# Patient Record
Sex: Female | Born: 1970 | Race: White | Hispanic: No | Marital: Married | Smoking: Current some day smoker
Health system: Southern US, Community
[De-identification: ages and names within clinical notes are randomized; demographics above are authoritative.]

## PROBLEM LIST (undated history)

## (undated) DIAGNOSIS — C801 Malignant (primary) neoplasm, unspecified: Secondary | ICD-10-CM

## (undated) DIAGNOSIS — L299 Pruritus, unspecified: Secondary | ICD-10-CM

## (undated) DIAGNOSIS — I10 Essential (primary) hypertension: Secondary | ICD-10-CM

## (undated) DIAGNOSIS — I2510 Atherosclerotic heart disease of native coronary artery without angina pectoris: Secondary | ICD-10-CM

## (undated) DIAGNOSIS — G2581 Restless legs syndrome: Secondary | ICD-10-CM

## (undated) DIAGNOSIS — E119 Type 2 diabetes mellitus without complications: Secondary | ICD-10-CM

## (undated) DIAGNOSIS — I219 Acute myocardial infarction, unspecified: Secondary | ICD-10-CM

## (undated) DIAGNOSIS — Z720 Tobacco use: Secondary | ICD-10-CM

## (undated) DIAGNOSIS — Z951 Presence of aortocoronary bypass graft: Secondary | ICD-10-CM

## (undated) DIAGNOSIS — I25119 Atherosclerotic heart disease of native coronary artery with unspecified angina pectoris: Secondary | ICD-10-CM

## (undated) DIAGNOSIS — F419 Anxiety disorder, unspecified: Secondary | ICD-10-CM

## (undated) HISTORY — DX: Type 2 diabetes mellitus without complications: E11.9

## (undated) HISTORY — DX: Malignant (primary) neoplasm, unspecified: C80.1

## (undated) HISTORY — DX: Anxiety disorder, unspecified: F41.9

## (undated) HISTORY — PX: CARDIAC CATHETERIZATION: SHX172

## (undated) HISTORY — DX: Restless legs syndrome: G25.81

## (undated) HISTORY — DX: Pruritus, unspecified: L29.9

## (undated) HISTORY — DX: Atherosclerotic heart disease of native coronary artery with unspecified angina pectoris: I25.119

## (undated) HISTORY — DX: Essential (primary) hypertension: I10

---

## 1992-07-09 DIAGNOSIS — C801 Malignant (primary) neoplasm, unspecified: Secondary | ICD-10-CM

## 1992-07-09 HISTORY — DX: Malignant (primary) neoplasm, unspecified: C80.1

## 2007-07-10 HISTORY — PX: PARTIAL HYSTERECTOMY: SHX80

## 2014-04-07 ENCOUNTER — Encounter: Payer: Self-pay | Admitting: *Deleted

## 2014-04-07 ENCOUNTER — Other Ambulatory Visit: Payer: Self-pay | Admitting: *Deleted

## 2014-04-07 ENCOUNTER — Telehealth: Payer: Self-pay | Admitting: *Deleted

## 2014-04-07 DIAGNOSIS — F419 Anxiety disorder, unspecified: Secondary | ICD-10-CM | POA: Insufficient documentation

## 2014-04-07 DIAGNOSIS — G2581 Restless legs syndrome: Secondary | ICD-10-CM | POA: Insufficient documentation

## 2014-04-07 DIAGNOSIS — E119 Type 2 diabetes mellitus without complications: Secondary | ICD-10-CM | POA: Insufficient documentation

## 2014-04-07 DIAGNOSIS — I10 Essential (primary) hypertension: Secondary | ICD-10-CM | POA: Insufficient documentation

## 2014-04-07 NOTE — Telephone Encounter (Signed)
Patient's husband is a Administrator and she rides with him. We are his primary care and she would like to have her records transferred here as well. Records release signed. EMR updated with medical history and medications. He will be coming back next month for a follow up and she will schedule her appt at the same time.  I suggested Dr Sabra Heck for both of them. Husband has been seeing Evelina Dun, FNP but she will be out on maternity leave.

## 2014-05-05 ENCOUNTER — Telehealth: Payer: Self-pay | Admitting: Family Medicine

## 2014-05-05 NOTE — Telephone Encounter (Signed)
Appt given per patient request 

## 2014-05-07 ENCOUNTER — Encounter: Payer: Self-pay | Admitting: Family Medicine

## 2014-05-07 ENCOUNTER — Ambulatory Visit (INDEPENDENT_AMBULATORY_CARE_PROVIDER_SITE_OTHER): Payer: Self-pay | Admitting: Family Medicine

## 2014-05-07 ENCOUNTER — Other Ambulatory Visit: Payer: Self-pay | Admitting: Family Medicine

## 2014-05-07 ENCOUNTER — Encounter (INDEPENDENT_AMBULATORY_CARE_PROVIDER_SITE_OTHER): Payer: Self-pay

## 2014-05-07 VITALS — BP 119/76 | HR 99 | Temp 98.9°F | Ht <= 58 in | Wt 157.4 lb

## 2014-05-07 DIAGNOSIS — G2581 Restless legs syndrome: Secondary | ICD-10-CM

## 2014-05-07 DIAGNOSIS — M1990 Unspecified osteoarthritis, unspecified site: Secondary | ICD-10-CM

## 2014-05-07 DIAGNOSIS — E1129 Type 2 diabetes mellitus with other diabetic kidney complication: Secondary | ICD-10-CM

## 2014-05-07 DIAGNOSIS — I10 Essential (primary) hypertension: Secondary | ICD-10-CM

## 2014-05-07 DIAGNOSIS — F329 Major depressive disorder, single episode, unspecified: Secondary | ICD-10-CM

## 2014-05-07 DIAGNOSIS — L299 Pruritus, unspecified: Secondary | ICD-10-CM

## 2014-05-07 DIAGNOSIS — F32A Depression, unspecified: Secondary | ICD-10-CM

## 2014-05-07 DIAGNOSIS — K219 Gastro-esophageal reflux disease without esophagitis: Secondary | ICD-10-CM

## 2014-05-07 DIAGNOSIS — G629 Polyneuropathy, unspecified: Secondary | ICD-10-CM

## 2014-05-07 MED ORDER — METFORMIN HCL 500 MG PO TABS: 1000.0000 mg | ORAL_TABLET | Freq: Two times a day (BID) | ORAL | Status: DC

## 2014-05-07 MED ORDER — IBUPROFEN 800 MG PO TABS: 800.0000 mg | ORAL_TABLET | Freq: Three times a day (TID) | ORAL | Status: DC | PRN

## 2014-05-07 MED ORDER — CARBIDOPA-LEVODOPA ER 50-200 MG PO TBCR: 1.0000 | EXTENDED_RELEASE_TABLET | Freq: Two times a day (BID) | ORAL | Status: DC

## 2014-05-07 MED ORDER — BUSPIRONE HCL 10 MG PO TABS: 10.0000 mg | ORAL_TABLET | Freq: Two times a day (BID) | ORAL | Status: DC

## 2014-05-07 MED ORDER — GLIPIZIDE 5 MG PO TABS: 2.5000 mg | ORAL_TABLET | Freq: Every day | ORAL | Status: DC

## 2014-05-07 MED ORDER — HYDROCHLOROTHIAZIDE 12.5 MG PO CAPS: 12.5000 mg | ORAL_CAPSULE | Freq: Every day | ORAL | Status: DC

## 2014-05-07 MED ORDER — SERTRALINE HCL 50 MG PO TABS: 50.0000 mg | ORAL_TABLET | Freq: Every day | ORAL | Status: DC

## 2014-05-07 MED ORDER — GLIPIZIDE 2.5 MG HALF TABLET: 2.5000 mg | ORAL_TABLET | Freq: Every day | ORAL | Status: DC

## 2014-05-07 MED ORDER — LISINOPRIL 20 MG PO TABS: 20.0000 mg | ORAL_TABLET | Freq: Every day | ORAL | Status: DC

## 2014-05-07 MED ORDER — GABAPENTIN 300 MG PO CAPS: 300.0000 mg | ORAL_CAPSULE | Freq: Three times a day (TID) | ORAL | Status: DC

## 2014-05-07 MED ORDER — HYDROXYZINE HCL 50 MG PO TABS: 50.0000 mg | ORAL_TABLET | Freq: Three times a day (TID) | ORAL | Status: DC | PRN

## 2014-05-07 MED ORDER — OMEPRAZOLE 20 MG PO CPDR: 20.0000 mg | DELAYED_RELEASE_CAPSULE | Freq: Every day | ORAL | Status: DC

## 2014-05-07 NOTE — Progress Notes (Signed)
   Subjective:    Patient ID: Melissa Rubio, female    DOB: [birth year], 43 y.o.   MRN: [id number]  HPI Patient is here for establishment visit.  She has hx of DM, RLS,hypertension, and anxiety. She has been having problems with brittle diabetes.  She was on glipizide and this was dc'd due to hypoglycemia.      Review of Systems  Constitutional: Negative for fever.  HENT: Negative for ear pain.   Eyes: Negative for discharge.  Respiratory: Negative for cough.   Cardiovascular: Negative for chest pain.  Gastrointestinal: Negative for abdominal distention.  Endocrine: Negative for polyuria.  Genitourinary: Negative for difficulty urinating.  Musculoskeletal: Negative for gait problem and neck pain.  Skin: Negative for color change and rash.  Neurological: Negative for speech difficulty and headaches.  Psychiatric/Behavioral: Negative for agitation.       Objective:    BP 119/76 mmHg  Pulse 99  Temp(Src) 98.9 F (37.2 C) (Oral)  Ht 3\' 5"  (1.041 m)  Wt 157 lb 6.4 oz (71.396 kg)  BMI 65.88 kg/m2  LMP 04/17/2014 Physical Exam  Constitutional: She is oriented to person, place, and time. She appears well-developed and well-nourished.  HENT:  Head: Normocephalic and atraumatic.  Mouth/Throat: Oropharynx is clear and moist.  Eyes: Pupils are equal, round, and reactive to light.  Neck: Normal range of motion. Neck supple.  Cardiovascular: Normal rate and regular rhythm.   No murmur heard. Pulmonary/Chest: Effort normal and breath sounds normal.  Abdominal: Soft. Bowel sounds are normal. There is no tenderness.  Neurological: She is alert and oriented to person, place, and time.  Skin: Skin is warm and dry.  Psychiatric: She has a normal mood and affect.          Assessment & Plan:     ICD-9-CM ICD-10-CM   1. Type 2 diabetes mellitus with other diabetic kidney complication 580.99 I33.82 metFORMIN (GLUCOPHAGE) 500 MG tablet     DISCONTINUED: glipiZIDE (GLUCOTROL) 2.5 mg  TABS tablet     DISCONTINUED: glipiZIDE (GLUCOTROL) 2.5 mg TABS tablet  2. Itch 698.9 L29.9 hydrOXYzine (ATARAX/VISTARIL) 50 MG tablet     DISCONTINUED: hydrOXYzine (ATARAX/VISTARIL) 50 MG tablet  3. Neuropathy 355.9 G62.9 gabapentin (NEURONTIN) 300 MG capsule     DISCONTINUED: gabapentin (NEURONTIN) 300 MG capsule  4. RLS (restless legs syndrome) 333.94 G25.81 carbidopa-levodopa (SINEMET CR) 50-200 MG per tablet     DISCONTINUED: carbidopa-levodopa (SINEMET CR) 50-200 MG per tablet  5. Depression 311 F32.9 busPIRone (BUSPAR) 10 MG tablet     sertraline (ZOLOFT) 50 MG tablet  6. Gastroesophageal reflux disease without esophagitis 530.81 K21.9 omeprazole (PRILOSEC) 20 MG capsule  7. Essential hypertension 401.9 I10 hydrochlorothiazide (MICROZIDE) 12.5 MG capsule     lisinopril (PRINIVIL,ZESTRIL) 20 MG tablet  8. Arthritis 716.90 M19.90 ibuprofen (ADVIL,MOTRIN) 800 MG tablet     No Follow-up on file.  Lysbeth Penner FNP

## 2014-11-11 ENCOUNTER — Other Ambulatory Visit: Payer: Self-pay | Admitting: Family Medicine

## 2014-11-11 NOTE — Telephone Encounter (Signed)
Appt made for Monday 5/9

## 2014-11-15 ENCOUNTER — Encounter: Payer: Self-pay | Admitting: Family

## 2014-11-15 ENCOUNTER — Encounter (INDEPENDENT_AMBULATORY_CARE_PROVIDER_SITE_OTHER): Payer: Self-pay

## 2014-11-15 ENCOUNTER — Ambulatory Visit: Payer: Self-pay | Admitting: Family

## 2014-11-15 VITALS — BP 126/89 | HR 110 | Temp 100.1°F | Ht 59.0 in | Wt 147.0 lb

## 2014-11-15 DIAGNOSIS — I10 Essential (primary) hypertension: Secondary | ICD-10-CM

## 2014-11-15 DIAGNOSIS — G4700 Insomnia, unspecified: Secondary | ICD-10-CM

## 2014-11-15 DIAGNOSIS — E1129 Type 2 diabetes mellitus with other diabetic kidney complication: Secondary | ICD-10-CM

## 2014-11-15 DIAGNOSIS — G2581 Restless legs syndrome: Secondary | ICD-10-CM

## 2014-11-15 DIAGNOSIS — R5383 Other fatigue: Secondary | ICD-10-CM

## 2014-11-15 DIAGNOSIS — K219 Gastro-esophageal reflux disease without esophagitis: Secondary | ICD-10-CM

## 2014-11-15 DIAGNOSIS — F419 Anxiety disorder, unspecified: Secondary | ICD-10-CM

## 2014-11-15 DIAGNOSIS — F32A Depression, unspecified: Secondary | ICD-10-CM

## 2014-11-15 DIAGNOSIS — E1165 Type 2 diabetes mellitus with hyperglycemia: Secondary | ICD-10-CM

## 2014-11-15 DIAGNOSIS — L299 Pruritus, unspecified: Secondary | ICD-10-CM

## 2014-11-15 DIAGNOSIS — F329 Major depressive disorder, single episode, unspecified: Secondary | ICD-10-CM

## 2014-11-15 LAB — POCT GLYCOSYLATED HEMOGLOBIN (HGB A1C): Hemoglobin A1C: 7.8

## 2014-11-15 MED ORDER — GLIPIZIDE 5 MG PO TABS: 2.5000 mg | ORAL_TABLET | Freq: Every day | ORAL | Status: DC

## 2014-11-15 MED ORDER — HYDROXYZINE HCL 50 MG PO TABS: 50.0000 mg | ORAL_TABLET | Freq: Three times a day (TID) | ORAL | Status: DC | PRN

## 2014-11-15 MED ORDER — FLUOXETINE HCL 40 MG PO CAPS: 40.0000 mg | ORAL_CAPSULE | Freq: Every day | ORAL | Status: DC

## 2014-11-15 MED ORDER — ALPRAZOLAM 0.25 MG PO TABS: 0.2500 mg | ORAL_TABLET | Freq: Two times a day (BID) | ORAL | Status: DC | PRN

## 2014-11-15 MED ORDER — LISINOPRIL 20 MG PO TABS: 20.0000 mg | ORAL_TABLET | Freq: Every day | ORAL | Status: DC

## 2014-11-15 MED ORDER — CARBIDOPA-LEVODOPA ER 50-200 MG PO TBCR: 2.0000 | EXTENDED_RELEASE_TABLET | Freq: Two times a day (BID) | ORAL | Status: DC

## 2014-11-15 MED ORDER — METFORMIN HCL 1000 MG PO TABS: 1000.0000 mg | ORAL_TABLET | Freq: Two times a day (BID) | ORAL | Status: DC

## 2014-11-15 MED ORDER — HYDROCHLOROTHIAZIDE 12.5 MG PO CAPS: 12.5000 mg | ORAL_CAPSULE | Freq: Every day | ORAL | Status: DC

## 2014-11-15 MED ORDER — OMEPRAZOLE 20 MG PO CPDR: 20.0000 mg | DELAYED_RELEASE_CAPSULE | Freq: Every day | ORAL | Status: DC

## 2014-11-15 MED ORDER — BUSPIRONE HCL 10 MG PO TABS: 10.0000 mg | ORAL_TABLET | Freq: Two times a day (BID) | ORAL | Status: DC

## 2014-11-15 NOTE — Patient Instructions (Signed)
Generalized Anxiety Disorder Generalized anxiety disorder (GAD) is a mental disorder. It interferes with life functions, including relationships, work, and school. GAD is different from normal anxiety, which everyone experiences at some point in their lives in response to specific life events and activities. Normal anxiety actually helps [place] prepare for and get through these life events and activities. Normal anxiety goes away after the event or activity is over.  GAD causes anxiety that is not necessarily related to specific events or activities. It also causes excess anxiety in proportion to specific events or activities. The anxiety associated with GAD is also difficult to control. GAD can vary from mild to severe. People with severe GAD can have intense waves of anxiety with physical symptoms (panic attacks).  SYMPTOMS The anxiety and worry associated with GAD are difficult to control. This anxiety and worry are related to many life events and activities and also occur more days than not for 6 months or longer. People with GAD also have three or more of the following symptoms (one or more in children):  Restlessness.   Fatigue.  Difficulty concentrating.   Irritability.  Muscle tension.  Difficulty sleeping or unsatisfying sleep. DIAGNOSIS GAD is diagnosed through an assessment by your health care provider. Your health care provider will ask you questions aboutyour mood,physical symptoms, and events in your life. Your health care provider may ask you about your medical history and use of alcohol or drugs, including prescription medicines. Your health care provider may also do a physical exam and blood tests. Certain medical conditions and the use of certain substances can cause symptoms similar to those associated with GAD. Your health care provider may refer you to a mental health specialist for further evaluation. TREATMENT The following therapies are usually used to treat GAD:    Medication. Antidepressant medication usually is prescribed for long-term daily control. Antianxiety medicines may be added in severe cases, especially when panic attacks occur.   Talk therapy (psychotherapy). Certain types of talk therapy can be helpful in treating GAD by providing support, education, and guidance. A form of talk therapy called cognitive behavioral therapy can teach you healthy ways to think about and react to daily life events and activities.  Stress managementtechniques. These include yoga, meditation, and exercise and can be very helpful when they are practiced regularly. A mental health specialist can help determine which treatment is best for you. Some people see improvement with one therapy. However, other people require a combination of therapies. Document Released: 10/20/2012 Document Revised: 11/09/2013 Document Reviewed: 10/20/2012 [facility] Patient Information 2015 [place], [place]. This information is not intended to replace advice given to you by your health care provider. Make sure you discuss any questions you have with your health care provider. Neuropathic Pain We often think that pain has a physical cause. If we get rid of the cause, the pain should go away. Nerves themselves can also cause pain. It is called neuropathic pain, which means nerve abnormality. It may be difficult for the patients who have it and for the treating caregivers. Pain is usually described as acute (short-lived) or chronic (long-lasting). Acute pain is related to the physical sensations caused by an injury. It can last from a few seconds to many weeks, but it usually goes away when normal healing occurs. Chronic pain lasts beyond the typical healing time. With neuropathic pain, the nerve fibers themselves may be damaged or injured. They then send incorrect signals to other pain centers. The pain you feel is real, but  the cause is not easy to find.  CAUSES  Chronic pain can result from  diseases, such as diabetes and shingles (an infection related to chickenpox), or from trauma, surgery, or amputation. It can also happen without any known injury or disease. The nerves are sending pain messages, even though there is no identifiable cause for such messages.   Other common causes of neuropathy include diabetes, phantom limb pain, or Regional Pain Syndrome (RPS).  As with all forms of chronic back pain, if neuropathy is not correctly treated, there can be a number of associated problems that lead to a downward cycle for the patient. These include depression, sleeplessness, feelings of fear and anxiety, limited social interaction and inability to do normal daily activities or work.  The most dramatic and mysterious example of neuropathic pain is called "phantom limb syndrome." This occurs when an arm or a leg has been removed because of illness or injury. The brain still gets pain messages from the nerves that originally carried impulses from the missing limb. These nerves now seem to misfire and cause troubling pain.  Neuropathic pain often seems to have no cause. It responds poorly to standard pain treatment. Neuropathic pain can occur after:  Shingles (herpes zoster virus infection).  A lasting burning sensation of the skin, caused usually by injury to a peripheral nerve.  Peripheral neuropathy which is widespread nerve damage, often caused by diabetes or alcoholism.  Phantom limb pain following an amputation.  Facial nerve problems (trigeminal neuralgia).  Multiple sclerosis.  Reflex sympathetic dystrophy.  Pain which comes with cancer and cancer chemotherapy.  Entrapment neuropathy such as when pressure is put on a nerve such as in carpal tunnel syndrome.  Back, leg, and hip problems (sciatica).  Spine or back surgery.  HIV Infection or AIDS where nerves are infected by viruses. Your caregiver can explain items in the above list which may apply to you. SYMPTOMS   Characteristics of neuropathic pain are:  Severe, sharp, electric shock-like, shooting, lightening-like, knife-like.  Pins and needles sensation.  Deep burning, deep cold, or deep ache.  Persistent numbness, tingling, or weakness.  Pain resulting from light touch or other stimulus that would not usually cause pain.  Increased sensitivity to something that would normally cause pain, such as a pinprick. Pain may persist for months or years following the healing of damaged tissues. When this happens, pain signals no longer sound an alarm about current injuries or injuries about to happen. Instead, the alarm system itself is not working correctly.  Neuropathic pain may get worse instead of better over time. For some people, it can lead to serious disability. It is important to be aware that severe injury in a limb can occur without a proper, protective pain response.Burns, cuts, and other injuries may go unnoticed. Without proper treatment, these injuries can become infected or lead to further disability. Take any injury seriously, and consult your caregiver for treatment. DIAGNOSIS  When you have a pain with no known cause, your caregiver will probably ask some specific questions:   Do you have any other conditions, such as diabetes, shingles, multiple sclerosis, or HIV infection?  How would you describe your pain? (Neuropathic pain is often described as shooting, stabbing, burning, or searing.)  Is your pain worse at any time of the day? (Neuropathic pain is usually worse at night.)  Does the pain seem to follow a certain physical pathway?  Does the pain come from an area that has missing or injured nerves? (An  example would be phantom limb pain.)  Is the pain triggered by minor things such as rubbing against the sheets at night? These questions often help define the type of pain involved. Once your caregiver knows what is happening, treatment can begin. Anticonvulsant, antidepressant  drugs, and various pain relievers seem to work in some cases. If another condition, such as diabetes is involved, better management of that disorder may relieve the neuropathic pain.  TREATMENT  Neuropathic pain is frequently long-lasting and tends not to respond to treatment with narcotic type pain medication. It may respond well to other drugs such as antiseizure and antidepressant medications. Usually, neuropathic problems do not completely go away, but partial improvement is often possible with proper treatment. Your caregivers have large numbers of medications available to treat you. Do not be discouraged if you do not get immediate relief. Sometimes different medications or a combination of medications will be tried before you receive the results you are hoping for. See your caregiver if you have pain that seems to be coming from nowhere and does not go away. Help is available.  SEEK IMMEDIATE MEDICAL CARE IF:   There is a sudden change in the quality of your pain, especially if the change is on only one side of the body.  You notice changes of the skin, such as redness, black or purple discoloration, swelling, or an ulcer.  You cannot move the affected limbs. Document Released: 03/22/2004 Document Revised: 09/17/2011 Document Reviewed: 03/22/2004 [facility] Patient Information 2015 [place], [place]. This information is not intended to replace advice given to you by your health care provider. Make sure you discuss any questions you have with your health care provider.

## 2014-11-15 NOTE — Progress Notes (Signed)
Subjective:    Patient ID: Melissa Rubio, female    DOB: Apr 02, 1971, 44 y.o.   MRN: [id number]  Diabetes She presents for her follow-up diabetic visit. She has type 2 diabetes mellitus. Her disease course has been stable. Hypoglycemia symptoms include nervousness/anxiousness. Pertinent negatives for hypoglycemia include no confusion, headaches, hunger or sleepiness. Associated symptoms include blurred vision, foot paresthesias and visual change. Pertinent negatives for diabetes include no foot ulcerations. Pertinent negatives for hypoglycemia complications include no blackouts. Symptoms are worsening. Diabetic complications include peripheral neuropathy. Pertinent negatives for diabetic complications include no CVA, heart disease or nephropathy. Risk factors for coronary artery disease include hypertension, tobacco exposure and stress. Current diabetic treatment includes oral agent (dual therapy). She is following a diabetic diet. Her breakfast blood glucose range is generally 130-140 mg/dl. An ACE inhibitor/angiotensin II receptor blocker is being taken. Eye exam is not current.  Hypertension This is a chronic problem. The current episode started more than 1 year ago. The problem has been resolved since onset. The problem is controlled. Associated symptoms include anxiety and blurred vision. Pertinent negatives include no headaches, palpitations, peripheral edema or shortness of breath. Risk factors for coronary artery disease include obesity, smoking/tobacco exposure, sedentary lifestyle and stress. Past treatments include ACE inhibitors and diuretics. The current treatment provides significant improvement. There is no history of kidney disease, CAD/MI, CVA, heart failure or a thyroid problem.  Gastrophageal Reflux She reports no belching, no choking, no coughing, no heartburn or no sore throat. This is a chronic problem. The current episode started more than 1 year ago. The problem occurs rarely. The  problem has been resolved. The symptoms are aggravated by certain foods and smoking. She has tried a PPI for the symptoms. The treatment provided significant relief.  Anxiety Presents for follow-up visit. Symptoms include depressed mood, excessive worry and nervous/anxious behavior. Patient reports no confusion, palpitations, panic or shortness of breath. Symptoms occur occasionally. The symptoms are aggravated by family issues. The quality of sleep is fair.   Her past medical history is significant for anxiety/panic attacks and depression. There is no history of CAD. Compliance with prior treatments has been good.  Peripheral Neuropathy Pt currently taking Sinemet Cr BID. Pt states she is having pain and burning in bilateral feet and legs. Pt states it feels like a "100 bee's stinging me". Pt states she can not afford the gabapentin, but states she can see a improvement with the pain since starting the Sinemet. States she thinks she needs it increased, because it is not working as well as it use to.     Review of Systems  Constitutional: Negative.   HENT: Negative.  Negative for sore throat.   Eyes: Positive for blurred vision.  Respiratory: Negative.  Negative for cough, choking and shortness of breath.   Cardiovascular: Negative.  Negative for palpitations.  Gastrointestinal: Negative.  Negative for heartburn.  Endocrine: Negative.   Genitourinary: Negative.   Musculoskeletal: Negative.   Neurological: Negative.  Negative for headaches.  Hematological: Negative.   Psychiatric/Behavioral: Negative for confusion. The patient is nervous/anxious.   All other systems reviewed and are negative.      Objective:   Physical Exam  Constitutional: She is oriented to person, place, and time. She appears well-developed and well-nourished. No distress.  HENT:  Head: Normocephalic and atraumatic.  Right Ear: External ear normal.  Left Ear: External ear normal.  Nose: Nose normal.    Mouth/Throat: Oropharynx is clear and moist.  Eyes: Pupils are equal,  round, and reactive to light.  Neck: Normal range of motion. Neck supple. No thyromegaly present.  Cardiovascular: Normal rate, regular rhythm, normal heart sounds and intact distal pulses.   No murmur heard. Pulmonary/Chest: Effort normal and breath sounds normal. No respiratory distress. She has no wheezes.  Abdominal: Soft. Bowel sounds are normal. She exhibits no distension. There is no tenderness.  Musculoskeletal: Normal range of motion. She exhibits no edema or tenderness.  Neurological: She is alert and oriented to person, place, and time. She has normal reflexes. No cranial nerve deficit.  Skin: Skin is warm and dry.  Psychiatric: She has a normal mood and affect. Her behavior is normal. Judgment and thought content normal.  Vitals reviewed.     BP 126/89 mmHg  Pulse 110  Temp(Src) 100.1 F (37.8 C) (Oral)  Ht 4' 11"  (1.499 m)  Wt 147 lb (66.679 kg)  BMI 29.67 kg/m2  LMP  (Approximate)     Assessment & Plan:  1. Essential hypertension - CMP14+EGFR - lisinopril (PRINIVIL,ZESTRIL) 20 MG tablet; Take 1 tablet (20 mg total) by mouth daily.  Dispense: 90 tablet; Refill: 3 - hydrochlorothiazide (MICROZIDE) 12.5 MG capsule; Take 1 capsule (12.5 mg total) by mouth daily.  Dispense: 90 capsule; Refill: 3  2. Type 2 diabetes mellitus with hyperglycemia - POCT glycosylated hemoglobin (Hb A1C) - CMP14+EGFR - Thyroid Panel With TSH - glipiZIDE (GLUCOTROL) 5 MG tablet; Take 0.5 tablets (2.5 mg total) by mouth daily before breakfast.  Dispense: 90 tablet; Refill: 3 - metFORMIN (GLUCOPHAGE) 1000 MG tablet; Take 1 tablet (1,000 mg total) by mouth 2 (two) times daily with a meal.  Dispense: 180 tablet; Refill: 3  3. Anxiety - CMP14+EGFR - FLUoxetine (PROZAC) 40 MG capsule; Take 1 capsule (40 mg total) by mouth daily.  Dispense: 90 capsule; Refill: 3 - ALPRAZolam (XANAX) 0.25 MG tablet; Take 1 tablet (0.25 mg  total) by mouth 2 (two) times daily as needed for anxiety or sleep.  Dispense: 30 tablet; Refill: 2  4. Gastroesophageal reflux disease, esophagitis presence not specified - CMP14+EGFR  5. Depression -Pt started on Prozac and Xanax as needed today -Pt told not use Xanax unless she needed them   CMP14+EGFR - busPIRone (BUSPAR) 10 MG tablet; Take 1 tablet (10 mg total) by mouth 2 (two) times daily.  Dispense: 60 tablet; Refill: 11 - FLUoxetine (PROZAC) 40 MG capsule; Take 1 capsule (40 mg total) by mouth daily.  Dispense: 90 capsule; Refill: 3  6. Gastroesophageal reflux disease without esophagitis - CMP14+EGFR - omeprazole (PRILOSEC) 20 MG capsule; Take 1 capsule (20 mg total) by mouth daily.  Dispense: 90 capsule; Refill: 3  7. Type 2 diabetes mellitus with other diabetic kidney complication - AJG81+LXBW  8. Itch - CMP14+EGFR - hydrOXYzine (ATARAX/VISTARIL) 50 MG tablet; Take 1 tablet (50 mg total) by mouth every 8 (eight) hours as needed.  Dispense: 270 tablet; Refill: 3  9. RLS (restless legs syndrome) - CMP14+EGFR - carbidopa-levodopa (SINEMET CR) 50-200 MG per tablet; Take 2 tablets by mouth 2 (two) times daily.  Dispense: 120 tablet; Refill: 3  10. Insomnia - ALPRAZolam (XANAX) 0.25 MG tablet; Take 1 tablet (0.25 mg total) by mouth 2 (two) times daily as needed for anxiety or sleep.  Dispense: 30 tablet; Refill: 2  11. Other fatigue - Anemia Profile B   Continue all meds Labs pending Health Maintenance reviewed Diet and exercise encouraged RTO 3 months  Evelina Dun, FNP

## 2014-11-16 ENCOUNTER — Other Ambulatory Visit: Payer: Self-pay | Admitting: *Deleted

## 2014-11-16 ENCOUNTER — Other Ambulatory Visit: Payer: Self-pay | Admitting: Family

## 2014-11-16 DIAGNOSIS — E1165 Type 2 diabetes mellitus with hyperglycemia: Secondary | ICD-10-CM

## 2014-11-16 LAB — ANEMIA PROFILE B
BASOS: 0 %
Basophils Absolute: 0 10*3/uL (ref 0.0–0.2)
EOS (ABSOLUTE): 0.4 10*3/uL (ref 0.0–0.4)
Eos: 3 %
FOLATE: 10.3 ng/mL (ref >3.0)
Ferritin: 70 ng/mL (ref 15–150)
HEMOGLOBIN: 13.4 g/dL (ref 11.1–15.9)
Hematocrit: 39.7 % (ref 34.0–46.6)
IMMATURE GRANS (ABS): 0 10*3/uL (ref 0.0–0.1)
Immature Granulocytes: 0 %
Iron Saturation: 17 % (ref 15–55)
Iron: 61 ug/dL (ref 27–159)
LYMPHS ABS: 3.2 10*3/uL — AB (ref 0.7–3.1)
Lymphs: 24 %
MCH: 30 pg (ref 26.6–33.0)
MCHC: 33.8 g/dL (ref 31.5–35.7)
MCV: 89 fL (ref 79–97)
Monocytes Absolute: 0.7 10*3/uL (ref 0.1–0.9)
Monocytes: 6 %
Neutrophils Absolute: 8.8 10*3/uL — ABNORMAL HIGH (ref 1.4–7.0)
Neutrophils: 67 %
Platelets: 460 10*3/uL — ABNORMAL HIGH (ref 150–379)
RBC: 4.47 x10E6/uL (ref 3.77–5.28)
RDW: 15 % (ref 12.3–15.4)
Retic Ct Pct: 1.6 % (ref 0.6–2.6)
Total Iron Binding Capacity: 366 ug/dL (ref 250–450)
UIBC: 305 ug/dL (ref 131–425)
VITAMIN B 12: 300 pg/mL (ref 211–946)
WBC: 13.2 10*3/uL — ABNORMAL HIGH (ref 3.4–10.8)

## 2014-11-16 LAB — CMP14+EGFR
A/G RATIO: 1.6 (ref 1.1–2.5)
ALBUMIN: 4.6 g/dL (ref 3.5–5.5)
ALT: 10 IU/L (ref 0–32)
AST: 8 IU/L (ref 0–40)
Alkaline Phosphatase: 79 IU/L (ref 39–117)
BUN/Creatinine Ratio: 16 (ref 9–23)
BUN: 11 mg/dL (ref 6–24)
Bilirubin Total: <0.2 mg/dL (ref 0.0–1.2)
CALCIUM: 10 mg/dL (ref 8.7–10.2)
CO2: 19 mmol/L (ref 18–29)
CREATININE: 0.69 mg/dL (ref 0.57–1.00)
Chloride: 102 mmol/L (ref 97–108)
GFR calc Af Amer: 123 mL/min/{1.73_m2} (ref >59)
GFR calc non Af Amer: 107 mL/min/{1.73_m2} (ref >59)
GLOBULIN, TOTAL: 2.9 g/dL (ref 1.5–4.5)
GLUCOSE: 169 mg/dL — AB (ref 65–99)
POTASSIUM: 4.8 mmol/L (ref 3.5–5.2)
Sodium: 141 mmol/L (ref 134–144)
Total Protein: 7.5 g/dL (ref 6.0–8.5)

## 2014-11-16 LAB — THYROID PANEL WITH TSH
FREE THYROXINE INDEX: 2.4 (ref 1.2–4.9)
T3 Uptake Ratio: 26 % (ref 24–39)
T4, Total: 9.4 ug/dL (ref 4.5–12.0)
TSH: 0.694 u[IU]/mL (ref 0.450–4.500)

## 2014-11-16 MED ORDER — METFORMIN HCL 1000 MG PO TABS: ORAL_TABLET | ORAL | Status: DC

## 2015-01-17 ENCOUNTER — Other Ambulatory Visit: Payer: Self-pay | Admitting: *Deleted

## 2015-01-17 DIAGNOSIS — M1990 Unspecified osteoarthritis, unspecified site: Secondary | ICD-10-CM

## 2015-01-17 MED ORDER — IBUPROFEN 800 MG PO TABS: 800.0000 mg | ORAL_TABLET | Freq: Three times a day (TID) | ORAL | Status: DC | PRN

## 2015-02-15 ENCOUNTER — Ambulatory Visit: Payer: Self-pay | Admitting: Family

## 2015-02-18 ENCOUNTER — Encounter: Payer: Self-pay | Admitting: Family Medicine

## 2015-03-30 ENCOUNTER — Other Ambulatory Visit: Payer: Self-pay | Admitting: Family

## 2015-03-31 NOTE — Telephone Encounter (Signed)
Last seen 11/15/14  Melissa Rubio

## 2015-05-13 ENCOUNTER — Other Ambulatory Visit: Payer: Self-pay | Admitting: Family

## 2015-07-14 ENCOUNTER — Other Ambulatory Visit: Payer: Self-pay | Admitting: Family

## 2015-07-14 NOTE — Telephone Encounter (Signed)
Has appt with you on 07/22/15

## 2015-07-22 ENCOUNTER — Ambulatory Visit (INDEPENDENT_AMBULATORY_CARE_PROVIDER_SITE_OTHER): Payer: Self-pay | Admitting: Pediatrics

## 2015-07-22 ENCOUNTER — Telehealth: Payer: Self-pay | Admitting: Pediatrics

## 2015-07-22 ENCOUNTER — Encounter: Payer: Self-pay | Admitting: Pediatrics

## 2015-07-22 VITALS — BP 124/84 | HR 67 | Temp 97.3°F | Ht 59.0 in | Wt 144.0 lb

## 2015-07-22 DIAGNOSIS — I10 Essential (primary) hypertension: Secondary | ICD-10-CM

## 2015-07-22 DIAGNOSIS — E1165 Type 2 diabetes mellitus with hyperglycemia: Secondary | ICD-10-CM

## 2015-07-22 DIAGNOSIS — G4700 Insomnia, unspecified: Secondary | ICD-10-CM

## 2015-07-22 DIAGNOSIS — Z6829 Body mass index (BMI) 29.0-29.9, adult: Secondary | ICD-10-CM

## 2015-07-22 DIAGNOSIS — F419 Anxiety disorder, unspecified: Secondary | ICD-10-CM

## 2015-07-22 DIAGNOSIS — G2581 Restless legs syndrome: Secondary | ICD-10-CM

## 2015-07-22 DIAGNOSIS — G629 Polyneuropathy, unspecified: Secondary | ICD-10-CM

## 2015-07-22 LAB — POCT GLYCOSYLATED HEMOGLOBIN (HGB A1C): Hemoglobin A1C: 7.9

## 2015-07-22 MED ORDER — GABAPENTIN 300 MG PO CAPS: 300.0000 mg | ORAL_CAPSULE | Freq: Three times a day (TID) | ORAL | Status: DC

## 2015-07-22 MED ORDER — TRAZODONE HCL 100 MG PO TABS: 100.0000 mg | ORAL_TABLET | Freq: Every day | ORAL | Status: DC

## 2015-07-22 MED ORDER — CITALOPRAM HYDROBROMIDE 20 MG PO TABS: 20.0000 mg | ORAL_TABLET | Freq: Every day | ORAL | Status: DC

## 2015-07-22 MED ORDER — ROPINIROLE HCL 0.5 MG PO TABS: 0.5000 mg | ORAL_TABLET | Freq: Three times a day (TID) | ORAL | Status: DC

## 2015-07-22 MED ORDER — TOPIRAMATE 25 MG PO TABS: 25.0000 mg | ORAL_TABLET | Freq: Two times a day (BID) | ORAL | Status: DC

## 2015-07-22 MED ORDER — ROPINIROLE HCL 1 MG PO TABS: 1.0000 mg | ORAL_TABLET | Freq: Two times a day (BID) | ORAL | Status: DC

## 2015-07-22 MED ORDER — GLIPIZIDE 5 MG PO TABS: ORAL_TABLET | ORAL | Status: DC

## 2015-07-22 NOTE — Patient Instructions (Addendum)
Ropironole: Half tab (0.25 mg) once daily 1 to 3 hours before bedtime. After two days increase to 0.5 mg before bed, and after 7 days to 1 mg daily. After one week may take one more tab, to 1.5mg .   Trazodone: take half tab (50mg ) 30 min before bed. Can increase to 100mg  after 1 week  Gabapentin: take 300mg  three times a day. After 2 weeks if not improving neuropathy pain call me.  Citalopram: start 1/2 tab for 4 days then take whole tab daily, stop fluoxetine.

## 2015-07-22 NOTE — Telephone Encounter (Signed)
Dont take gabapentin and ropirinole. I sent in topiramte 25mg  BID, start with that.

## 2015-07-22 NOTE — Progress Notes (Signed)
Subjective:    Patient ID: Melissa Rubio, female    DOB: 03-29-1971, 45 y.o.   MRN: [id number]  CC: medication follow up   HPI: Melissa Rubio is a 45 y.o. female presenting for medication follow up  DM2: Last saw eye doctor over the summer--doctor in [place] On metformin and glipizide, takes a quarter of a 5mg  tablet once a day When on half a tab she was having low BGLs Sometimes during day BGLs still get elevated, up to 200s  Neuropathy: Says she has severe neuropathy Pain constant in LE, feet, hands Neuropathy is form her DM2 per pt Has tried mulitple OTC including capsacin cream, other numbing creams and lotions Interested in trying topamax, a family member is on it. She has been prescribed gabapentin in the past but was too expensive for her to pick up   RLS: was on sinmet, is too expensive for her to continue, she is not sure if it is helping  Insomnia: was taking xanax, helped sometimes.   Anxiety: taking fluoxetine, not sure if it is helping  HTN: taking meds regularly, no HA, no vision changes  Depression screen [facility] 2/9 07/22/2015 11/15/2014  Decreased Interest 0 0  Down, Depressed, Hopeless 0 0  PHQ - 2 Score 0 0     Relevant past medical, surgical, family and social history reviewed and updated as indicated. Interim medical history since our last visit reviewed. Allergies and medications reviewed and updated.    ROS: Per HPI unless specifically indicated above  History  Smoking status  . Heavy Tobacco Smoker -- 0.50 packs/day  Smokeless tobacco  . Not on file    Past Medical History Patient Active Problem List   Diagnosis Date Noted  . BMI 29.0-29.9,adult 07/22/2015  . GERD (gastroesophageal reflux disease) 11/15/2014  . Depression 11/15/2014  . Diabetes ([place])   . Anxiety   . Hypertension   . RLS (restless legs syndrome)       Objective:    BP 124/84 mmHg  Pulse 67  Temp(Src) 97.3 F (36.3 C) (Oral)  Ht 4\' 11"  (1.499 m)  Wt 144 lb  (65.318 kg)  BMI 29.07 kg/m2  Wt Readings from Last 3 Encounters:  07/22/15 144 lb (65.318 kg)  11/15/14 147 lb (66.679 kg)  05/07/14 157 lb 6.4 oz (71.396 kg)     Gen: NAD, alert, cooperative with exam, NCAT EYES: EOMI, no scleral injection or icterus ENT:  TMs pearly gray b/l, OP without erythema LYMPH: no cervical LAD CV: NRRR, normal S1/S2, no murmur, distal pulses 2+ b/l Resp: CTABL, no wheezes, normal WOB Abd: +BS, soft, NTND. no guarding or organomegaly Ext: No edema, warm Neuro: Alert and oriented, strength equal b/l UE and LE, coordination grossly normal, sensation intact to monofilment and touch b/l feet MSK: normal muscle bulk     Assessment & Plan:    Melissa Rubio was seen today for multiple med problem f/u.  Diagnoses and all orders for this visit:  Type 2 diabetes mellitus with hyperglycemia, without long-term current use of insulin ([place]) Recheck HgA1c today. Does have peripheral neuropathy attritbuted to DM2. Continue current emds, check HgA1c today -     POCT glycosylated hemoglobin (Hb A1C) -     glipiZIDE (GLUCOTROL) 5 MG tablet; Take 1.25mg  every morning  BMI 29.0-29.9,adult Continue lifestyle changes, cont to avoid sugary foods,  Essential hypertension Continue current medicines. Labs checked recently. BP controlled today  Neuropathy ([facility]) Symptoms uncontrolled. Sent in gabapentin, was $50 at [place] for  pt, not able to afford. She has been trying multiple OTC meds. Trial of topiramate, start 25mg  BID, will increase as needed.  Insomnia:  Multiple contributing factors including uncontrolled RLS and neuropathy. Will do trial of trazodone. Discussed xanax is not a good medicine to take regularly for insomnia. Need to find other treatment options. Discussed sleep hygiene.  -     traZODone (DESYREL) 100 MG tablet; Take 1 tablet (100 mg total) by mouth at bedtime  RLS Not able to afford sinemet. Will send in ropirinole to see if cheaper. Gave taper for  increasing.  Anxiety  Did not notice any improvement with the fluoxetine. Will stop fluoxetine, start citalopram. Will increase as needed. Continue buspar. Stop xanax. Not clear where pt filling prior Rx for xanax, received Rx for 30 tabs with 2 refills in 11/2014, could not find in [place] controlled database. -     citalopram (CELEXA) 20 MG tablet; Take 1 tablet (20 mg total) by mouth daily.  Follow up plan: Return in about 3 months (around 10/20/2015).  Assunta Found, MD [facility] 07/22/2015, 11:14 AM

## 2015-07-22 NOTE — Telephone Encounter (Signed)
Patient aware.

## 2015-07-23 DIAGNOSIS — G4700 Insomnia, unspecified: Secondary | ICD-10-CM | POA: Insufficient documentation

## 2015-07-28 ENCOUNTER — Telehealth: Payer: Self-pay | Admitting: Pediatrics

## 2015-07-28 DIAGNOSIS — G629 Polyneuropathy, unspecified: Secondary | ICD-10-CM

## 2015-07-28 MED ORDER — TOPIRAMATE 25 MG PO TABS: 25.0000 mg | ORAL_TABLET | Freq: Two times a day (BID) | ORAL | Status: DC

## 2015-07-28 NOTE — Telephone Encounter (Signed)
Gave coupon for topiramate to try. Will look for other resources for meds and care, may be worthwhile to try the health department.

## 2015-08-02 ENCOUNTER — Telehealth: Payer: Self-pay | Admitting: Pediatrics

## 2015-08-03 NOTE — Telephone Encounter (Signed)
Can try 150mg  or 1.5 tabs of trazodone, was given 100mg  tabs at last appointment. Should work on sleep hygiene changes as well, no TV after 8 pm, no naps during the day, no TV, cell phone or tablet in the bedroom. Bedroom for sleep and sex alone. If not falling asleep within 15-20 minutes should get up and do something that makes her sleepy, like reading. Set alarm every morning for the same time, get up every morning same time whether she feels like she got sleep the night before or not so she is retraining herself to sleep at the right times.

## 2015-08-05 NOTE — Telephone Encounter (Signed)
Patient aware and verbalizes understanding. 

## 2015-08-11 ENCOUNTER — Ambulatory Visit (INDEPENDENT_AMBULATORY_CARE_PROVIDER_SITE_OTHER): Payer: Self-pay | Admitting: Family

## 2015-08-11 ENCOUNTER — Encounter: Payer: Self-pay | Admitting: Family

## 2015-08-11 VITALS — BP 131/88 | HR 87 | Temp 98.6°F | Ht 59.0 in | Wt 143.0 lb

## 2015-08-11 DIAGNOSIS — J019 Acute sinusitis, unspecified: Secondary | ICD-10-CM

## 2015-08-11 MED ORDER — AMOXICILLIN-POT CLAVULANATE 875-125 MG PO TABS: 1.0000 | ORAL_TABLET | Freq: Two times a day (BID) | ORAL | Status: DC

## 2015-08-11 MED ORDER — FLUTICASONE PROPIONATE 50 MCG/ACT NA SUSP: 2.0000 | Freq: Every day | NASAL | Status: DC

## 2015-08-11 NOTE — Progress Notes (Signed)
Subjective:    Patient ID: Melissa Rubio, female    DOB: [birth year], 45 y.o.   MRN: [id number]  Cough Associated symptoms include shortness of breath. Pertinent negatives include no chills, headaches or sore throat.  Sinus Problem This is a new problem. The current episode started 1 to 4 weeks ago. The problem is unchanged. Her pain is at a severity of 0/10. She is experiencing no pain. Associated symptoms include congestion, coughing, a hoarse voice, shortness of breath and sneezing. Pertinent negatives include no chills, headaches or sore throat. (Ear congestion ) Past treatments include oral decongestants, saline sprays and spray decongestants. The treatment provided mild relief.      Review of Systems  Constitutional: Negative.  Negative for chills.  HENT: Positive for congestion, hoarse voice and sneezing. Negative for sore throat.   Eyes: Negative.   Respiratory: Positive for cough and shortness of breath.   Cardiovascular: Negative.  Negative for palpitations.  Gastrointestinal: Negative.   Endocrine: Negative.   Genitourinary: Negative.   Musculoskeletal: Negative.   Neurological: Negative.  Negative for headaches.  Hematological: Negative.   Psychiatric/Behavioral: Negative.   All other systems reviewed and are negative.      Objective:   Physical Exam  Constitutional: She is oriented to person, place, and time. She appears well-developed and well-nourished. No distress.  HENT:  Head: Normocephalic and atraumatic.  Left Ear: External ear normal. A middle ear effusion is present.  Nasal passage erythemas with mild swelling  Oropharynx erythemas  Eyes: Pupils are equal, round, and reactive to light.  Neck: Normal range of motion. Neck supple. No thyromegaly present.  Cardiovascular: Normal rate, regular rhythm, normal heart sounds and intact distal pulses.   No murmur heard. Pulmonary/Chest: Effort normal and breath sounds normal. No respiratory distress. She has  no wheezes.  Abdominal: Soft. Bowel sounds are normal. She exhibits no distension. There is no tenderness.  Musculoskeletal: Normal range of motion. She exhibits no edema or tenderness.  Neurological: She is alert and oriented to person, place, and time. She has normal reflexes. No cranial nerve deficit.  Skin: Skin is warm and dry.  Psychiatric: She has a normal mood and affect. Her behavior is normal. Judgment and thought content normal.  Vitals reviewed.     BP 131/88 mmHg  Pulse 87  Temp(Src) 98.6 F (37 C) (Oral)  Ht 4\' 11"  (1.499 m)  Wt 143 lb (64.864 kg)  BMI 28.87 kg/m2     Assessment & Plan:  1. Acute sinusitis, recurrence not specified, unspecified location -- Take meds as prescribed - Use a cool mist humidifier  -Use saline nose sprays frequently -Saline irrigations of the nose can be very helpful if done frequently.  * 4X daily for 1 week*  * Use of a nettie pot can be helpful with this. Follow directions with this* -Force fluids -For any cough or congestion  Use plain Mucinex- regular strength or max strength is fine   * Children- consult with Pharmacist for dosing -For fever or aces or pains- take tylenol or ibuprofen appropriate for age and weight.  * for fevers greater than 101 orally you may alternate ibuprofen and tylenol every  3 hours. -Throat lozenges if help -New toothbrush in 3 days - amoxicillin-clavulanate (AUGMENTIN) 875-125 MG tablet; Take 1 tablet by mouth 2 (two) times daily.  Dispense: 14 tablet; Refill: 0 - fluticasone (FLONASE) 50 MCG/ACT nasal spray; Place 2 sprays into both nostrils daily.  Dispense: 16 g; Refill: [place],  FNP  

## 2015-08-11 NOTE — Patient Instructions (Signed)
Sinusitis, Adult Sinusitis is redness, soreness, and inflammation of the paranasal sinuses. Paranasal sinuses are air pockets within the bones of your face. They are located beneath your eyes, in the middle of your forehead, and above your eyes. In healthy paranasal sinuses, mucus is able to drain out, and air is able to circulate through them by way of your nose. However, when your paranasal sinuses are inflamed, mucus and air can become trapped. This can allow bacteria and other germs to grow and cause infection. Sinusitis can develop quickly and last only a short time (acute) or continue over a long period (chronic). Sinusitis that lasts for more than 12 weeks is considered chronic. CAUSES Causes of sinusitis include:  Allergies.  Structural abnormalities, such as displacement of the cartilage that separates your nostrils (deviated septum), which can decrease the air flow through your nose and sinuses and affect sinus drainage.  Functional abnormalities, such as when the small hairs (cilia) that line your sinuses and help remove mucus do not work properly or are not present. SIGNS AND SYMPTOMS Symptoms of acute and chronic sinusitis are the same. The primary symptoms are pain and pressure around the affected sinuses. Other symptoms include:  Upper toothache.  Earache.  Headache.  Bad breath.  Decreased sense of smell and taste.  A cough, which worsens when you are lying flat.  Fatigue.  Fever.  Thick drainage from your nose, which often is green and may contain pus (purulent).  Swelling and warmth over the affected sinuses. DIAGNOSIS Your health care provider will perform a physical exam. During your exam, your health care provider may perform any of the following to help determine if you have acute sinusitis or chronic sinusitis:  Look in your nose for signs of abnormal growths in your nostrils (nasal polyps).  Tap over the affected sinus to check for signs of  infection.  View the inside of your sinuses using an imaging device that has a light attached (endoscope). If your health care provider suspects that you have chronic sinusitis, one or more of the following tests may be recommended:  Allergy tests.  Nasal culture. A sample of mucus is taken from your nose, sent to a lab, and screened for bacteria.  Nasal cytology. A sample of mucus is taken from your nose and examined by your health care provider to determine if your sinusitis is related to an allergy. TREATMENT Most cases of acute sinusitis are related to a viral infection and will resolve on their own within 10 days. Sometimes, medicines are prescribed to help relieve symptoms of both acute and chronic sinusitis. These may include pain medicines, decongestants, nasal steroid sprays, or saline sprays. However, for sinusitis related to a bacterial infection, your health care provider will prescribe antibiotic medicines. These are medicines that will help kill the bacteria causing the infection. Rarely, sinusitis is caused by a fungal infection. In these cases, your health care provider will prescribe antifungal medicine. For some cases of chronic sinusitis, surgery is needed. Generally, these are cases in which sinusitis recurs more than 3 times per year, despite other treatments. HOME CARE INSTRUCTIONS  Drink plenty of water. Water helps thin the mucus so your sinuses can drain more easily.  Use a humidifier.  Inhale steam 3-4 times a day (for example, sit in the bathroom with the shower running).  Apply a warm, moist washcloth to your face 3-4 times a day, or as directed by your health care provider.  Use saline nasal sprays to help   moisten and clean your sinuses.  Take medicines only as directed by your health care provider.  If you were prescribed either an antibiotic or antifungal medicine, finish it all even if you start to feel better. SEEK IMMEDIATE MEDICAL CARE IF:  You have  increasing pain or severe headaches.  You have nausea, vomiting, or drowsiness.  You have swelling around your face.  You have vision problems.  You have a stiff neck.  You have difficulty breathing.   This information is not intended to replace advice given to you by your health care provider. Make sure you discuss any questions you have with your health care provider.   Document Released: 06/25/2005 Document Revised: 07/16/2014 Document Reviewed: 07/10/2011 Elsevier Interactive Patient Education 2016 [employer]  - Take meds as prescribed - Use a cool mist humidifier  -Use saline nose sprays frequently -Saline irrigations of the nose can be very helpful if done frequently.  * 4X daily for 1 week*  * Use of a nettie pot can be helpful with this. Follow directions with this* -Force fluids -For any cough or congestion  Use plain Mucinex- regular strength or max strength is fine   * Children- consult with Pharmacist for dosing -For fever or aces or pains- take tylenol or ibuprofen appropriate for age and weight.  * for fevers greater than 101 orally you may alternate ibuprofen and tylenol every  3 hours. -Throat lozenges if help   Everlynn Sagun, FNP   

## 2015-10-01 ENCOUNTER — Other Ambulatory Visit: Payer: Self-pay | Admitting: Family Medicine

## 2015-10-24 ENCOUNTER — Ambulatory Visit: Payer: Self-pay | Admitting: Family Medicine

## 2015-12-01 ENCOUNTER — Other Ambulatory Visit: Payer: Self-pay | Admitting: Pediatrics

## 2016-01-15 ENCOUNTER — Other Ambulatory Visit: Payer: Self-pay | Admitting: Family

## 2016-01-16 NOTE — Telephone Encounter (Signed)
Last seen 08/11/15  [place]

## 2016-03-19 ENCOUNTER — Ambulatory Visit (INDEPENDENT_AMBULATORY_CARE_PROVIDER_SITE_OTHER): Payer: Self-pay | Admitting: Family Medicine

## 2016-03-19 ENCOUNTER — Encounter: Payer: Self-pay | Admitting: Family Medicine

## 2016-03-19 DIAGNOSIS — F329 Major depressive disorder, single episode, unspecified: Secondary | ICD-10-CM

## 2016-03-19 DIAGNOSIS — E1165 Type 2 diabetes mellitus with hyperglycemia: Secondary | ICD-10-CM

## 2016-03-19 DIAGNOSIS — I10 Essential (primary) hypertension: Secondary | ICD-10-CM

## 2016-03-19 DIAGNOSIS — L299 Pruritus, unspecified: Secondary | ICD-10-CM

## 2016-03-19 DIAGNOSIS — F32A Depression, unspecified: Secondary | ICD-10-CM

## 2016-03-19 MED ORDER — GLIPIZIDE 5 MG PO TABS: ORAL_TABLET | ORAL | 3 refills | Status: DC

## 2016-03-19 MED ORDER — CITALOPRAM HYDROBROMIDE 40 MG PO TABS: 40.0000 mg | ORAL_TABLET | Freq: Every day | ORAL | 11 refills | Status: DC

## 2016-03-19 MED ORDER — BUSPIRONE HCL 15 MG PO TABS: 15.0000 mg | ORAL_TABLET | Freq: Two times a day (BID) | ORAL | 11 refills | Status: DC

## 2016-03-19 MED ORDER — METFORMIN HCL 1000 MG PO TABS: 1000.0000 mg | ORAL_TABLET | Freq: Two times a day (BID) | ORAL | 11 refills | Status: DC

## 2016-03-19 MED ORDER — HYDROCHLOROTHIAZIDE 12.5 MG PO CAPS: 12.5000 mg | ORAL_CAPSULE | Freq: Every day | ORAL | 11 refills | Status: DC

## 2016-03-19 MED ORDER — GABAPENTIN 800 MG PO TABS: 800.0000 mg | ORAL_TABLET | Freq: Three times a day (TID) | ORAL | 5 refills | Status: DC

## 2016-03-19 MED ORDER — GABAPENTIN 300 MG PO CAPS: 300.0000 mg | ORAL_CAPSULE | Freq: Three times a day (TID) | ORAL | 11 refills | Status: DC

## 2016-03-19 MED ORDER — IBUPROFEN 800 MG PO TABS: 800.0000 mg | ORAL_TABLET | Freq: Three times a day (TID) | ORAL | 3 refills | Status: DC | PRN

## 2016-03-19 MED ORDER — LISINOPRIL 20 MG PO TABS: 20.0000 mg | ORAL_TABLET | Freq: Every day | ORAL | 11 refills | Status: DC

## 2016-03-19 MED ORDER — HYDROXYZINE HCL 50 MG PO TABS: 50.0000 mg | ORAL_TABLET | Freq: Three times a day (TID) | ORAL | 3 refills | Status: DC | PRN

## 2016-03-19 NOTE — Addendum Note (Signed)
Addended by: Timmothy Euler on: 03/19/2016 03:03 PM   Modules accepted: Orders

## 2016-03-19 NOTE — Progress Notes (Signed)
HPI  Patient presents today here for follow up of chronic medical conditions.  Anxiety and depression. Patient would like to go back on Xanax, she states Celexa and BuSpar not helping as much. She denies any suicidal thoughts or depression.  Type 2 diabetes Fasting blood sugar is often 150-175, occasionally 200 No hypoglycemia Takes 1 metformin twice daily most of the time, taking 1.25 mg of glipizide when she eats once a day.  Hypertension Good medication compliance No chest pain, dyspnea, palpitations, leg edema.    PMH: Smoking status noted ROS: Per HPI  Objective: BP 130/86   Pulse 98   Temp 98.1 F (36.7 C) (Oral)   Ht 4' 11"  (1.499 m)   Wt 153 lb 3.2 oz (69.5 kg)   BMI 30.94 kg/m  Gen: NAD, alert, cooperative with exam HEENT: NCAT CV: RRR, good S1/S2, no murmur Resp: CTABL, no wheezes, non-labored Ext: No edema, warm Neuro: Alert and oriented, No gross deficits  Diabetic Foot Exam - Simple   Simple Foot Form Visual Inspection No deformities, no ulcerations, no other skin breakdown bilaterally:  Yes Sensation Testing Intact to touch and monofilament testing bilaterally:  Yes Pulse Check Posterior Tibialis and Dorsalis pulse intact bilaterally:  Yes Comments Thick calluses on the heels bilaterally      Assessment and plan:  # Type 2 diabetes Likely not very well controlled, fasting blood sugar 175 average. Encouraged her to have better compliance with metformin 1 capsule twice daily. Glipizide per her current regimen, patient states that she's had hypoglycemic episodes 16 times that caused hospitalization, so we will focus our attention on being safe rather than aggressive further. CMP today I requested A1c today, however patient has financial limitations being self-pay and would like to do that on her next visit  # Hypertension Well-controlled Refilled medications, CMP  # Anxiety Discussed avoiding Xanax. Maximize Celexa Increase BuSpar to  15 mg twice daily  # Diabetic neuropathy Patient is not sure if she has been taking 300 mg 3 times a day or 900 mg 3 times a day of gabapentin She will call back with the correct dosage Helped by gabapentin quite a bit.    Orders Placed This Encounter  Procedures  . [id number]    Meds ordered this encounter  Medications  . metFORMIN (GLUCOPHAGE) 1000 MG tablet    Sig: Take 1 tablet (1,000 mg total) by mouth 2 (two) times daily with a meal.    Dispense:  60 tablet    Refill:  11  . lisinopril (PRINIVIL,ZESTRIL) 20 MG tablet    Sig: Take 1 tablet (20 mg total) by mouth daily.    Dispense:  30 tablet    Refill:  11  . hydrOXYzine (ATARAX/VISTARIL) 50 MG tablet    Sig: Take 1 tablet (50 mg total) by mouth every 8 (eight) hours as needed.    Dispense:  270 tablet    Refill:  3  . hydrochlorothiazide (MICROZIDE) 12.5 MG capsule    Sig: Take 1 capsule (12.5 mg total) by mouth daily.    Dispense:  30 capsule    Refill:  11  . glipiZIDE (GLUCOTROL) 5 MG tablet    Sig: Take 1.57m every morning    Dispense:  30 tablet    Refill:  3  . citalopram (CELEXA) 40 MG tablet    Sig: Take 1 tablet (40 mg total) by mouth daily.    Dispense:  30 tablet    Refill:  11  . busPIRone (  BUSPAR) 15 MG tablet    Sig: Take 1 tablet (15 mg total) by mouth 2 (two) times daily.    Dispense:  60 tablet    Refill:  11  . gabapentin (NEURONTIN) 300 MG capsule    Sig: Take 1 capsule (300 mg total) by mouth 3 (three) times daily.    Dispense:  90 capsule    Refill:  [place], MD [facility] 03/19/2016, 2:39 PM

## 2016-03-19 NOTE — Addendum Note (Signed)
Addended by: Timmothy Euler on: 03/19/2016 03:16 PM   Modules accepted: Orders

## 2016-03-19 NOTE — Patient Instructions (Signed)
Great to see you!  I have increased the dosage of your celexa and the buspar  We will call with lab results within 1 week  Consider shopping around for you rmedications

## 2016-03-20 ENCOUNTER — Other Ambulatory Visit: Payer: Self-pay | Admitting: *Deleted

## 2016-03-20 DIAGNOSIS — E875 Hyperkalemia: Secondary | ICD-10-CM

## 2016-03-20 LAB — CMP14+EGFR
A/G RATIO: 1.4 (ref 1.2–2.2)
ALT: 11 IU/L (ref 0–32)
AST: 10 IU/L (ref 0–40)
Albumin: 4.4 g/dL (ref 3.5–5.5)
Alkaline Phosphatase: 90 IU/L (ref 39–117)
BUN/Creatinine Ratio: 17 (ref 9–23)
BUN: 12 mg/dL (ref 6–24)
CHLORIDE: 103 mmol/L (ref 96–106)
CO2: 23 mmol/L (ref 18–29)
Calcium: 9.8 mg/dL (ref 8.7–10.2)
Creatinine, Ser: 0.71 mg/dL (ref 0.57–1.00)
GFR calc Af Amer: 119 mL/min/{1.73_m2} (ref >59)
GFR calc non Af Amer: 103 mL/min/{1.73_m2} (ref >59)
GLUCOSE: 194 mg/dL — AB (ref 65–99)
Globulin, Total: 3.1 g/dL (ref 1.5–4.5)
POTASSIUM: 5.4 mmol/L — AB (ref 3.5–5.2)
Sodium: 141 mmol/L (ref 134–144)
Total Protein: 7.5 g/dL (ref 6.0–8.5)

## 2017-01-18 ENCOUNTER — Other Ambulatory Visit: Payer: Self-pay | Admitting: Family Medicine

## 2017-01-21 NOTE — Telephone Encounter (Signed)
Last seen 03/19/16  Dr Wendi Snipes  Dr Evette Doffing PCP

## 2017-02-06 ENCOUNTER — Ambulatory Visit (INDEPENDENT_AMBULATORY_CARE_PROVIDER_SITE_OTHER): Payer: Self-pay

## 2017-02-06 ENCOUNTER — Other Ambulatory Visit: Payer: Self-pay | Admitting: Family

## 2017-02-06 ENCOUNTER — Ambulatory Visit (INDEPENDENT_AMBULATORY_CARE_PROVIDER_SITE_OTHER): Payer: Self-pay | Admitting: Family Medicine

## 2017-02-06 VITALS — BP 120/76 | HR 102 | Temp 97.9°F | Ht 59.0 in | Wt 148.0 lb

## 2017-02-06 DIAGNOSIS — I10 Essential (primary) hypertension: Secondary | ICD-10-CM

## 2017-02-06 DIAGNOSIS — F419 Anxiety disorder, unspecified: Secondary | ICD-10-CM

## 2017-02-06 DIAGNOSIS — E1165 Type 2 diabetes mellitus with hyperglycemia: Secondary | ICD-10-CM

## 2017-02-06 DIAGNOSIS — M25551 Pain in right hip: Secondary | ICD-10-CM

## 2017-02-06 DIAGNOSIS — L299 Pruritus, unspecified: Secondary | ICD-10-CM

## 2017-02-06 LAB — BAYER DCA HB A1C WAIVED

## 2017-02-06 MED ORDER — GABAPENTIN 800 MG PO TABS: 800.0000 mg | ORAL_TABLET | Freq: Three times a day (TID) | ORAL | 5 refills | Status: DC

## 2017-02-06 MED ORDER — CITALOPRAM HYDROBROMIDE 40 MG PO TABS: 40.0000 mg | ORAL_TABLET | Freq: Every day | ORAL | 3 refills | Status: DC

## 2017-02-06 MED ORDER — LISINOPRIL 20 MG PO TABS: 20.0000 mg | ORAL_TABLET | Freq: Every day | ORAL | 3 refills | Status: DC

## 2017-02-06 MED ORDER — IBUPROFEN 800 MG PO TABS: 800.0000 mg | ORAL_TABLET | Freq: Three times a day (TID) | ORAL | 3 refills | Status: DC | PRN

## 2017-02-06 MED ORDER — METFORMIN HCL 1000 MG PO TABS: 1000.0000 mg | ORAL_TABLET | Freq: Two times a day (BID) | ORAL | 11 refills | Status: DC

## 2017-02-06 MED ORDER — MELOXICAM 15 MG PO TABS: 15.0000 mg | ORAL_TABLET | Freq: Every day | ORAL | 3 refills | Status: DC

## 2017-02-06 MED ORDER — BUSPIRONE HCL 15 MG PO TABS: 15.0000 mg | ORAL_TABLET | Freq: Two times a day (BID) | ORAL | 3 refills | Status: DC

## 2017-02-06 MED ORDER — HYDROCHLOROTHIAZIDE 12.5 MG PO CAPS: 12.5000 mg | ORAL_CAPSULE | Freq: Every day | ORAL | 3 refills | Status: DC

## 2017-02-06 MED ORDER — GLIPIZIDE 5 MG PO TABS: ORAL_TABLET | ORAL | 3 refills | Status: DC

## 2017-02-06 MED ORDER — HYDROXYZINE HCL 50 MG PO TABS: 50.0000 mg | ORAL_TABLET | Freq: Three times a day (TID) | ORAL | 3 refills | Status: DC | PRN

## 2017-02-06 MED ORDER — ALPRAZOLAM 0.5 MG PO TABS: 0.5000 mg | ORAL_TABLET | Freq: Two times a day (BID) | ORAL | 0 refills | Status: DC | PRN

## 2017-02-06 NOTE — Progress Notes (Signed)
Subjective:  Patient ID: Melissa Rubio, female    DOB: 1971/05/07  Age: 46 y.o. MRN: [id number]  CC: Diabetes (pt here today for routine follow up of her chronic medications and also c/o right hip pain and she states the Hydroxyzine doesn't work so she has been taking some of her friends Xanax.)   HPI Melissa Rubio presents for Severe right hip pain. It's been going on for about 7 months. It increases when she walks. It has a feeling like there is sand in the joint then it'll burn and frequently it'll feel like there is a knife jabbing in. She states that it's 10/10 in severity. She has to limp. No relief with over-the-counter medications. Of note is that her depression screen showed that she had trouble falling asleep horse dating a sleep nearly every day and that she has very little energy nearly every day as well as appetite disturbance. Every day. The remainder of the pH Q was unremarkable.    Depression screen [facility] 2/9 02/06/2017 03/19/2016 07/22/2015  Decreased Interest 1 0 0  Down, Depressed, Hopeless 0 0 0  PHQ - 2 Score 1 0 0   Patient also states that she is taking her glipizide but she only takes a quarter of a pill a day because risk of her sugar dropping too low. She continues to take her metformin as noted on her med list. However she notes that she gets dizzy and woozy and her vision is not clear at times when her sugar climbs this with his case yesterday morning when she checked it and it was 279. She is not checking regularly. She is not following the diet and of course she cannot exercise currently.  Patient has been borrowing Xanax due to her severe anxiety. She relates that her son coated when he was taking opiate pain medication 6 years ago. This was after a car accident. Although he came back and he is doing well now she had to do CPR on him until emergency medical providers arrived. For 6 months she couldn't even leading out of her sight or even leading close his bedroom  door. She still has severe anxiety over his condition. He also inadvertently stuck his hand under her running lawnmower and cut off the end of several of his fingers. History Halimah has a past medical history of Anxiety; Cancer ([place]) (1994); Diabetes ([place]); Hypertension; Itching; and RLS (restless legs syndrome).   She has a past surgical history that includes Partial hysterectomy (2009).   Her family history is not on file.She reports that she has been smoking.  She has been smoking about 0.50 packs per day. She does not have any smokeless tobacco history on file. She reports that she drinks alcohol. She reports that she does not use drugs.    ROS Review of Systems  Constitutional: Negative for activity change, appetite change and fever.  HENT: Negative for congestion, rhinorrhea and sore throat.   Eyes: Negative for visual disturbance.  Respiratory: Negative for cough and shortness of breath.   Cardiovascular: Negative for chest pain and palpitations.  Gastrointestinal: Negative for abdominal pain, diarrhea and nausea.  Genitourinary: Negative for dysuria.  Musculoskeletal: Negative for arthralgias and myalgias.    Objective:  BP 120/76   Pulse (!) 102   Temp 97.9 F (36.6 C) (Oral)   Ht 4' 11"  (1.499 m)   Wt 148 lb (67.1 kg)   BMI 29.89 kg/m   BP Readings from Last 3 Encounters:  02/06/17 120/76  03/19/16 130/86  08/11/15 131/88    Wt Readings from Last 3 Encounters:  02/06/17 148 lb (67.1 kg)  03/19/16 153 lb 3.2 oz (69.5 kg)  08/11/15 143 lb (64.9 kg)     Physical Exam  Constitutional: She is oriented to person, place, and time. Vital signs are normal. She appears well-developed and well-nourished. She is cooperative. No distress.  HENT:  Head: Normocephalic and atraumatic.  Right Ear: External ear normal.  Left Ear: External ear normal.  Nose: Nose normal.  Mouth/Throat: Oropharynx is clear and moist.  Eyes: Pupils are equal, round, and reactive to light.  Conjunctivae and EOM are normal.  Neck: Normal range of motion. Neck supple. No thyromegaly present.  Cardiovascular: Normal rate, regular rhythm and normal heart sounds.   No murmur heard. Pulmonary/Chest: Effort normal and breath sounds normal. No respiratory distress. She has no wheezes. She has no rales.  Abdominal: Soft. Bowel sounds are normal. She exhibits no distension. There is no tenderness.  Lymphadenopathy:    She has no cervical adenopathy.  Neurological: She is alert and oriented to person, place, and time. She has normal reflexes.  Skin: Skin is warm and dry.  Psychiatric: Thought content normal. Her mood appears anxious. Her affect is labile. Her speech is rapid and/or pressured. She is agitated. Cognition and memory are normal. She expresses impulsivity.      Assessment & Plan:   Fantasia was seen today for diabetes.  Diagnoses and all orders for this visit:  Type 2 diabetes mellitus with hyperglycemia, without long-term current use of insulin (HCC) -     CBC with Differential/Platelet -     CMP14+EGFR -     Bayer DCA Hb A1c Waived -     DG HIP UNILAT W OR W/O PELVIS 2-3 VIEWS RIGHT; Future -     metFORMIN (GLUCOPHAGE) 1000 MG tablet; Take 1 tablet (1,000 mg total) by mouth 2 (two) times daily with a meal. -     glipiZIDE (GLUCOTROL) 5 MG tablet; Take 1.56m every morning -     Ambulatory referral to diabetic education  Anxiety -     CBC with Differential/Platelet -     CMP14+EGFR  Essential hypertension -     CBC with Differential/Platelet -     CMP14+EGFR -     hydrochlorothiazide (MICROZIDE) 12.5 MG capsule; Take 1 capsule (12.5 mg total) by mouth daily. -     lisinopril (PRINIVIL,ZESTRIL) 20 MG tablet; Take 1 tablet (20 mg total) by mouth daily.  Pain of right hip joint -     CBC with Differential/Platelet -     CMP14+EGFR -     DG HIP UNILAT W OR W/O PELVIS 2-3 VIEWS RIGHT; Future -     Ambulatory referral to Orthopedic Surgery  Itch -      hydrOXYzine (ATARAX/VISTARIL) 50 MG tablet; Take 1 tablet (50 mg total) by mouth every 8 (eight) hours as needed.  Other orders -     ALPRAZolam (XANAX) 0.5 MG tablet; Take 1 tablet (0.5 mg total) by mouth 2 (two) times daily as needed for anxiety. -     gabapentin (NEURONTIN) 800 MG tablet; Take 1 tablet (800 mg total) by mouth 3 (three) times daily. -     busPIRone (BUSPAR) 15 MG tablet; Take 1 tablet (15 mg total) by mouth 2 (two) times daily. -     citalopram (CELEXA) 40 MG tablet; Take 1 tablet (40 mg total) by mouth daily. -  meloxicam (MOBIC) 15 MG tablet; Take 1 tablet (15 mg total) by mouth daily. -     ibuprofen (ADVIL,MOTRIN) 800 MG tablet; Take 1 tablet (800 mg total) by mouth every 8 (eight) hours as needed.       I am having Ms. Oliphant start on ALPRAZolam and meloxicam. I am also having her maintain her metFORMIN, gabapentin, busPIRone, hydrochlorothiazide, lisinopril, glipiZIDE, citalopram, hydrOXYzine, and ibuprofen.  Allergies as of 02/06/2017   No Known Allergies     Medication List       Accurate as of 02/06/17  7:08 PM. Always use your most recent med list.          ALPRAZolam 0.5 MG tablet Commonly known as:  XANAX Take 1 tablet (0.5 mg total) by mouth 2 (two) times daily as needed for anxiety.   busPIRone 15 MG tablet Commonly known as:  BUSPAR Take 1 tablet (15 mg total) by mouth 2 (two) times daily.   citalopram 40 MG tablet Commonly known as:  CELEXA Take 1 tablet (40 mg total) by mouth daily.   gabapentin 800 MG tablet Commonly known as:  NEURONTIN Take 1 tablet (800 mg total) by mouth 3 (three) times daily.   glipiZIDE 5 MG tablet Commonly known as:  GLUCOTROL Take 1.57m every morning   hydrochlorothiazide 12.5 MG capsule Commonly known as:  MICROZIDE Take 1 capsule (12.5 mg total) by mouth daily.   hydrOXYzine 50 MG tablet Commonly known as:  ATARAX/VISTARIL Take 1 tablet (50 mg total) by mouth every 8 (eight) hours as needed.     ibuprofen 800 MG tablet Commonly known as:  ADVIL,MOTRIN Take 1 tablet (800 mg total) by mouth every 8 (eight) hours as needed.   lisinopril 20 MG tablet Commonly known as:  PRINIVIL,ZESTRIL Take 1 tablet (20 mg total) by mouth daily.   meloxicam 15 MG tablet Commonly known as:  MOBIC Take 1 tablet (15 mg total) by mouth daily.   metFORMIN 1000 MG tablet Commonly known as:  GLUCOPHAGE Take 1 tablet (1,000 mg total) by mouth 2 (two) times daily with a meal.      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 I told her that I would give her a one-month supply only at one Xanax a day if she would begin counseling. However this is a limited one-month supply  Additionally she needs to have diabetic educational intervention due to her elevated A1c of 14.0 today.  I declined to use any steroids in her situation although she requested a cortisone shot due to the lack of control of her diabetes. Meloxicam was prescribed an orthopedic referral to be made.  Follow-up: No Follow-up on file.  WClaretta Fraise M.D.

## 2017-02-07 LAB — CBC WITH DIFFERENTIAL/PLATELET
BASOS ABS: 0 10*3/uL (ref 0.0–0.2)
BASOS: 0 %
EOS (ABSOLUTE): 0.3 10*3/uL (ref 0.0–0.4)
Eos: 2 %
Hematocrit: 43.6 % (ref 34.0–46.6)
Hemoglobin: 14.6 g/dL (ref 11.1–15.9)
Immature Grans (Abs): 0 10*3/uL (ref 0.0–0.1)
Immature Granulocytes: 0 %
Lymphocytes Absolute: 2.5 10*3/uL (ref 0.7–3.1)
Lymphs: 22 %
MCH: 30 pg (ref 26.6–33.0)
MCHC: 33.5 g/dL (ref 31.5–35.7)
MCV: 90 fL (ref 79–97)
Monocytes Absolute: 0.7 10*3/uL (ref 0.1–0.9)
Monocytes: 6 %
NEUTROS ABS: 8 10*3/uL — AB (ref 1.4–7.0)
Neutrophils: 70 %
PLATELETS: 322 10*3/uL (ref 150–379)
RBC: 4.87 x10E6/uL (ref 3.77–5.28)
RDW: 13.6 % (ref 12.3–15.4)
WBC: 11.5 10*3/uL — ABNORMAL HIGH (ref 3.4–10.8)

## 2017-02-07 LAB — CMP14+EGFR
A/G RATIO: 1.5 (ref 1.2–2.2)
ALT: 14 IU/L (ref 0–32)
AST: 15 IU/L (ref 0–40)
Albumin: 4.5 g/dL (ref 3.5–5.5)
Alkaline Phosphatase: 112 IU/L (ref 39–117)
BUN/Creatinine Ratio: 14 (ref 9–23)
BUN: 9 mg/dL (ref 6–24)
CHLORIDE: 101 mmol/L (ref 96–106)
CO2: 15 mmol/L — ABNORMAL LOW (ref 20–29)
Calcium: 10 mg/dL (ref 8.7–10.2)
Creatinine, Ser: 0.66 mg/dL (ref 0.57–1.00)
GFR calc non Af Amer: 107 mL/min/{1.73_m2} (ref >59)
GFR, EST AFRICAN AMERICAN: 123 mL/min/{1.73_m2} (ref >59)
Globulin, Total: 3 g/dL (ref 1.5–4.5)
Glucose: 295 mg/dL — ABNORMAL HIGH (ref 65–99)
POTASSIUM: 4.3 mmol/L (ref 3.5–5.2)
Sodium: 137 mmol/L (ref 134–144)
Total Protein: 7.5 g/dL (ref 6.0–8.5)

## 2017-02-28 ENCOUNTER — Encounter (INDEPENDENT_AMBULATORY_CARE_PROVIDER_SITE_OTHER): Payer: Self-pay | Admitting: Orthopaedic Surgery

## 2017-02-28 ENCOUNTER — Ambulatory Visit (INDEPENDENT_AMBULATORY_CARE_PROVIDER_SITE_OTHER): Payer: Self-pay | Admitting: Orthopaedic Surgery

## 2017-02-28 VITALS — BP 134/85 | HR 95 | Ht 60.0 in | Wt 148.0 lb

## 2017-02-28 DIAGNOSIS — M545 Low back pain, unspecified: Secondary | ICD-10-CM

## 2017-02-28 NOTE — Progress Notes (Signed)
Office Visit Note   Patient: Melissa Rubio           Date of Birth: 1970-10-13           MRN: [id number] Visit Date: 02/28/2017              Requested by: Eustaquio Maize, MD [place], [place] [id number] PCP: Eustaquio Maize, MD   Assessment & Plan: Visit Diagnoses:  1. Acute right-sided low back pain without sciatica    2.   Uncontrolled diabetes with A1c greater than 14 Plan: Patient currently does not have any insurance. With an A1c of 14 she has uncontrolled diabetes and needs to be on insulin. She's artery on maximal oral medication treatment. She's not really candidate for prednisone pack because her uncontrolled diabetes. She needs to get her diabetes under control. She will call us later if she like to proceed with an MRI scan. Currently she is not a candidate even if she had disc protrusion for an epidural steroid injection or surgery with her A1c being 14. Thank you for the opportunity share in her care.  Follow-Up Instructions: No Follow-up on file.   Orders:  No orders of the defined types were placed in this encounter.  No orders of the defined types were placed in this encounter.     Procedures: No procedures performed   Clinical Data: No additional findings.   Subjective: Chief Complaint  Patient presents with  . Right Hip - Pain    HPI 50 show female seen with significant right hip pain she came inflammatory in her toe she can't sit flat she's unable and a right side. She's had onset of the symptoms for a couple months. 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 Patient has diagnosis of diabetes she is on oral medications including metformin. She also has hypertension she's been taking Neurontin for the leg pain. Previous surgery for ovary removal  she's had cervical cancer 94. Patient smokes 1 pack per day  Review of Systems 14 point review of systems positive for acid reflux anxiety diabetes times about 4 years cervical cancer depression hypertension and migraines. She is at the eye exam is wearing glasses that she states is related to her diabetes.     Objective: Vital Signs: BP 134/85   Pulse 95   Ht 5' (1.524 m)   Wt 148 lb (67.1 kg)   BMI 28.90 kg/m   Physical Exam  Musculoskeletal:  Patient has positive reverse straight leg raising on the right she ambulates just walking on her toe. She has pain if she loads over the right side. There is mild sciatic notch tenderness. Quads are strong no hip flexion weakness. Anterior tib EHL is strong. She has decreased sensation of both ankles and feet related to her neuropathy. She does have palpable pulses.    Ortho Exam  Specialty Comments:  No specialty comments available.  Imaging: No results found.   PMFS History: Patient Active Problem List   Diagnosis Date Noted  . Insomnia 07/23/2015  . BMI 29.0-29.9,adult 07/22/2015  . GERD (gastroesophageal reflux disease) 11/15/2014  . Depression 11/15/2014  . Diabetes ([place])   . Anxiety   . Hypertension   . RLS (restless legs syndrome)  Past Medical History:  Diagnosis Date  . Anxiety   . 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 Diabetes ([place])   . Hypertension   . Itching   . RLS (restless legs syndrome)     No family history on file.  Past Surgical History:  Procedure Laterality Date  . PARTIAL HYSTERECTOMY  2009   Social History   Occupational History  . Not on file.   Social History Main Topics  . Smoking status: Heavy Tobacco Smoker    Packs/day: 0.50  . Smokeless tobacco: Never Used  . Alcohol use Yes  . Drug use: No  . Sexual activity: Not on file

## 2017-03-12 ENCOUNTER — Encounter: Payer: Self-pay | Admitting: Family Medicine

## 2017-03-12 ENCOUNTER — Ambulatory Visit (INDEPENDENT_AMBULATORY_CARE_PROVIDER_SITE_OTHER): Payer: Self-pay | Admitting: Family Medicine

## 2017-03-12 VITALS — BP 130/86 | HR 112 | Temp 98.1°F | Ht 60.0 in | Wt 144.0 lb

## 2017-03-12 DIAGNOSIS — E1165 Type 2 diabetes mellitus with hyperglycemia: Secondary | ICD-10-CM

## 2017-03-12 MED ORDER — BUSPIRONE HCL 10 MG PO TABS: 10.0000 mg | ORAL_TABLET | Freq: Three times a day (TID) | ORAL | 1 refills | Status: DC

## 2017-03-12 NOTE — Progress Notes (Signed)
Subjective:  Patient ID: Melissa Rubio, female    DOB: 1971-06-08  Age: 46 y.o. MRN: [id number]  CC: Follow-up (pt here today )   HPI Leighana Neyman presents for Stating that her fasting sugars running around 172 lis morning. She would like to have more Xanax but has NOT seen a counselor yet. She increased the glipizide to one half tablet a day. She denies any low glucose reaction.  Pt. Prefers to see the same MD each visit and plans to follow with Dr. Wendi Snipes.  Depression screen [facility] 2/9 02/06/2017 03/19/2016 07/22/2015  Decreased Interest 1 0 0  Down, Depressed, Hopeless 0 0 0  PHQ - 2 Score 1 0 0    History [place] has a past medical history of Anxiety; Cancer ([place]) (1994); Diabetes ([place]); Hypertension; Itching; and RLS (restless legs syndrome).   She has a past surgical history that includes Partial hysterectomy (2009).   Her family history is not on file.She reports that she has been smoking.  She has been smoking about 0.50 packs per day. She has never used smokeless tobacco. She reports that she drinks alcohol. She reports that she does not use drugs.    ROS Review of Systems  Constitutional: Negative for activity change, appetite change and fever.  HENT: Negative for congestion, rhinorrhea and sore throat.   Eyes: Negative for visual disturbance.  Respiratory: Negative for cough and shortness of breath.   Cardiovascular: Negative for chest pain and palpitations.  Gastrointestinal: Negative for abdominal pain, diarrhea and nausea.  Genitourinary: Negative for dysuria.  Musculoskeletal: Positive for arthralgias, back pain and myalgias.  Psychiatric/Behavioral: The patient is nervous/anxious.     Objective:  BP 130/86   Pulse (!) 112   Temp 98.1 F (36.7 C) (Oral)   Ht 5' (1.524 m)   Wt 144 lb (65.3 kg)   BMI 28.12 kg/m   BP Readings from Last 3 Encounters:  03/12/17 130/86  02/28/17 134/85  02/06/17 120/76    Wt Readings from Last 3 Encounters:  03/12/17  144 lb (65.3 kg)  02/28/17 148 lb (67.1 kg)  02/06/17 148 lb (67.1 kg)     Physical Exam  Constitutional: She is oriented to person, place, and time. She appears well-developed and well-nourished. No distress.  Cardiovascular: Normal rate and regular rhythm.   Pulmonary/Chest: Breath sounds normal.  Neurological: She is alert and oriented to person, place, and time.  Skin: Skin is warm and dry.  Psychiatric: She has a normal mood and affect.      Assessment & Plan:   Clovia was seen today for follow-up.  Diagnoses and all orders for this visit:  Type 2 diabetes mellitus with hyperglycemia, without long-term current use of insulin (HCC)  Other orders -     busPIRone (BUSPAR) 10 MG tablet; Take 1 tablet (10 mg total) by mouth 3 (three) times daily.       I have discontinued Ms. Oliphant's busPIRone and meloxicam. I am also having her start on busPIRone. Additionally, I am having her maintain her ALPRAZolam, metFORMIN, gabapentin, hydrochlorothiazide, lisinopril, glipiZIDE, citalopram, hydrOXYzine, and ibuprofen.  Allergies as of 03/12/2017   No Known Allergies     Medication List       Accurate as of 03/12/17  5:37 PM. Always use your most recent med list.          ALPRAZolam 0.5 MG tablet Commonly known as:  XANAX Take 1 tablet (0.5 mg total) by mouth 2 (two) times daily as needed for  anxiety.   busPIRone 10 MG tablet Commonly known as:  BUSPAR Take 1 tablet (10 mg total) by mouth 3 (three) times daily.   citalopram 40 MG tablet Commonly known as:  CELEXA Take 1 tablet (40 mg total) by mouth daily.   gabapentin 800 MG tablet Commonly known as:  NEURONTIN Take 1 tablet (800 mg total) by mouth 3 (three) times daily.   glipiZIDE 5 MG tablet Commonly known as:  GLUCOTROL Take 1.25mg  every morning   hydrochlorothiazide 12.5 MG capsule Commonly known as:  MICROZIDE Take 1 capsule (12.5 mg total) by mouth daily.   hydrOXYzine 50 MG tablet Commonly known as:   ATARAX/VISTARIL Take 1 tablet (50 mg total) by mouth every 8 (eight) hours as needed.   ibuprofen 800 MG tablet Commonly known as:  ADVIL,MOTRIN Take 1 tablet (800 mg total) by mouth every 8 (eight) hours as needed.   lisinopril 20 MG tablet Commonly known as:  PRINIVIL,ZESTRIL Take 1 tablet (20 mg total) by mouth daily.   metFORMIN 1000 MG tablet Commonly known as:  GLUCOPHAGE Take 1 tablet (1,000 mg total) by mouth 2 (two) times daily with a meal.            Discharge Care Instructions        Start     Ordered   03/12/17 0000  busPIRone (BUSPAR) 10 MG tablet  3 times daily     03/12/17 1650     Specific request for refill of xanax was declined. Pt. Declined referral to pain clinic.  Follow-up: Return in about 1 month (around 04/11/2017) for diabetes with Dr. Wendi Snipes.  Claretta Fraise, M.D.

## 2017-03-13 ENCOUNTER — Other Ambulatory Visit: Payer: Self-pay | Admitting: Family Medicine

## 2017-04-01 ENCOUNTER — Other Ambulatory Visit: Payer: Self-pay | Admitting: Family Medicine

## 2017-04-01 ENCOUNTER — Telehealth: Payer: Self-pay | Admitting: Family Medicine

## 2017-04-01 DIAGNOSIS — E1165 Type 2 diabetes mellitus with hyperglycemia: Secondary | ICD-10-CM

## 2017-04-01 MED ORDER — GLIPIZIDE 5 MG PO TABS: ORAL_TABLET | ORAL | 3 refills | Status: DC

## 2017-04-01 NOTE — Telephone Encounter (Signed)
Patient notified

## 2017-04-01 NOTE — Telephone Encounter (Signed)
Please advise 

## 2017-04-01 NOTE — Telephone Encounter (Signed)
I sent in the requested prescription 

## 2017-04-11 ENCOUNTER — Encounter: Payer: Self-pay | Admitting: Family Medicine

## 2017-04-11 ENCOUNTER — Ambulatory Visit (INDEPENDENT_AMBULATORY_CARE_PROVIDER_SITE_OTHER): Payer: Self-pay | Admitting: Family Medicine

## 2017-04-11 VITALS — BP 134/89 | HR 101 | Temp 98.3°F | Ht 61.0 in | Wt 143.2 lb

## 2017-04-11 DIAGNOSIS — F419 Anxiety disorder, unspecified: Secondary | ICD-10-CM

## 2017-04-11 DIAGNOSIS — Z23 Encounter for immunization: Secondary | ICD-10-CM

## 2017-04-11 DIAGNOSIS — G894 Chronic pain syndrome: Secondary | ICD-10-CM

## 2017-04-11 DIAGNOSIS — I10 Essential (primary) hypertension: Secondary | ICD-10-CM

## 2017-04-11 DIAGNOSIS — E1165 Type 2 diabetes mellitus with hyperglycemia: Secondary | ICD-10-CM

## 2017-04-11 MED ORDER — LISINOPRIL-HYDROCHLOROTHIAZIDE 20-12.5 MG PO TABS: 1.0000 | ORAL_TABLET | Freq: Every day | ORAL | 3 refills | Status: DC

## 2017-04-11 MED ORDER — GABAPENTIN 800 MG PO TABS: 800.0000 mg | ORAL_TABLET | Freq: Four times a day (QID) | ORAL | 5 refills | Status: DC

## 2017-04-11 MED ORDER — CYCLOBENZAPRINE HCL 10 MG PO TABS: 10.0000 mg | ORAL_TABLET | Freq: Three times a day (TID) | ORAL | 1 refills | Status: DC | PRN

## 2017-04-11 MED ORDER — INSULIN NPH (HUMAN) (ISOPHANE) 100 UNIT/ML ~~LOC~~ SUSP: 5.0000 [IU] | Freq: Two times a day (BID) | SUBCUTANEOUS | 11 refills | Status: DC

## 2017-04-11 MED ORDER — "INSULIN SYRINGE 29G X 1/2"" 0.5 ML MISC": 1.0000 | Freq: Two times a day (BID) | 11 refills | Status: DC

## 2017-04-11 MED ORDER — ALPRAZOLAM 0.5 MG PO TABS: 0.5000 mg | ORAL_TABLET | Freq: Two times a day (BID) | ORAL | 1 refills | Status: DC | PRN

## 2017-04-11 NOTE — Patient Instructions (Addendum)
Great to see you!  Come back in 1 month to discuss diabetes  Start NPH 5 units twice daily, increase by 1 unit twice daily every 3-4 days until your fasting blood sugar is 150-200.   Stop glipizide  I have replaced lisinopril and HCTZ separately as a combination pill.

## 2017-04-11 NOTE — Progress Notes (Signed)
HPI  Patient reports no insurance coverage, cash pay  Patient presents today here for follow-up of chronic medical conditions and fatigue.  Type 2 diabetes Patient has had diabetes for several years. She was previously treated with high-dose glipizide and metformin. She states that she's been" diabetic shot" extending times.  Her A1c was found to be greater than 14 on August 1. She had a lengthy discussion with another physician in our practice and is now willing to use insulin. She has been taking metformin twice daily and glipizide once daily, her average fasting blood sugar is 250-350 Her average random blood sugar throughout the day is over 200, occasionally she has one as low as 170.  Anxiety She is doing somewhat well on Celexa and BuSpar She tried hydroxyzine without good improvement. She was started on Xanax with good improvement. She requests refill.  Hypertension Reports good medication compliance. No chest pain or headaches   She would also like an influenza vaccine  PMH: Smoking status noted ROS: Per HPI  Objective: BP 134/89   Pulse (!) 101   Temp 98.3 F (36.8 C) (Oral)   Ht 5\' 1"  (1.549 m)   Wt 143 lb 3.2 oz (65 kg)   BMI 27.06 kg/m  Gen: NAD, alert, cooperative with exam HEENT: NCAT CV: RRR, good S1/S2, no murmur Resp: CTABL, no wheezes, non-labored Ext: No edema, warm Neuro: Alert and oriented, No gross deficits  Assessment and plan:  # Type 2 diabetes Uncontrolled, according to her blood sugars reported today she continues to be in control Discontinue glipizide, I'm concerned that she does not have much residual pancreatic function Starting NPH, 5 units twice daily, titrate by 2 units every 3-4 days until fasting blood sugars 150-200. No hypoglycemia recently Discussed diet as well   # anxiety Continue Celexa and BuSpar Refill Xanax Controlled overall  # hypertension Well-controlled Changing lisinopril 20 and HCTZ 12.5 separately to  Prinzide, this is also in the 4$ list  # immunization-counseling provided for all components  Chronic pain Discussed with patient that I am not willing to prescribe her chronic narcotics for any narcotics considering that she is already using Xanax first anxiety. She has been seen by orthopedics and has had recommendation to pursue an MRI, although this is difficult for her.  Follow up 1 month CBG log reviewed and provided    Orders Placed This Encounter  Procedures  . Flu Vaccine QUAD 36+ mos IM    Meds ordered this encounter  Medications  . lisinopril-hydrochlorothiazide (ZESTORETIC) 20-12.5 MG tablet    Sig: Take 1 tablet by mouth daily.    Dispense:  90 tablet    Refill:  3    Replacing lisinopril and HCTZ, please dc these  . INSULIN SYRINGE .5CC/29G 29G X 1/2" 0.5 ML MISC    Sig: 1 Syringe by Does not apply route 2 (two) times daily.    Dispense:  100 each    Refill:  11  . insulin NPH Human (HUMULIN N,NOVOLIN N) 100 UNIT/ML injection    Sig: Inject 0.05-0.15 mLs (5-15 Units total) into the skin 2 (two) times daily before a meal.    Dispense:  10 mL    Refill:  11  . gabapentin (NEURONTIN) 800 MG tablet    Sig: Take 1 tablet (800 mg total) by mouth 4 (four) times daily.    Dispense:  120 tablet    Refill:  5  . ALPRAZolam (XANAX) 0.5 MG tablet    Sig:  Take 1 tablet (0.5 mg total) by mouth 2 (two) times daily as needed for anxiety.    Dispense:  60 tablet    Refill:  [place], MD [place] 04/11/2017, 3:54 PM

## 2017-04-19 ENCOUNTER — Telehealth: Payer: Self-pay | Admitting: Family Medicine

## 2017-04-19 NOTE — Telephone Encounter (Signed)
Patient calling to confirm she is taking insulin correctly.  She started with 5 units BID and has been increasing by 1 unit every 3 days.  She is now at 7 units BID and blood sugar is running in the 200's.  Advised patient she should increase dosage by 2 units every 3-4 days until blood sugars are running between 150-200 and to call back with any questions.  Patient understands instruction.

## 2017-05-17 ENCOUNTER — Other Ambulatory Visit: Payer: Self-pay | Admitting: Family Medicine

## 2017-05-23 ENCOUNTER — Ambulatory Visit (INDEPENDENT_AMBULATORY_CARE_PROVIDER_SITE_OTHER): Payer: Self-pay | Admitting: Family Medicine

## 2017-05-23 ENCOUNTER — Encounter: Payer: Self-pay | Admitting: Family Medicine

## 2017-05-23 VITALS — BP 124/82 | HR 96 | Temp 97.4°F | Ht 61.0 in | Wt 152.2 lb

## 2017-05-23 DIAGNOSIS — F419 Anxiety disorder, unspecified: Secondary | ICD-10-CM

## 2017-05-23 DIAGNOSIS — E1165 Type 2 diabetes mellitus with hyperglycemia: Secondary | ICD-10-CM

## 2017-05-23 MED ORDER — ALPRAZOLAM 0.5 MG PO TABS: 0.5000 mg | ORAL_TABLET | Freq: Three times a day (TID) | ORAL | 2 refills | Status: DC | PRN

## 2017-05-23 MED ORDER — INSULIN NPH (HUMAN) (ISOPHANE) 100 UNIT/ML ~~LOC~~ SUSP: 40.0000 [IU] | Freq: Two times a day (BID) | SUBCUTANEOUS | 3 refills | Status: DC

## 2017-05-23 NOTE — Progress Notes (Signed)
   HPI  Patient presents today to follow-up for type 2 diabetes.  Cash pay patient  Patient has been titrating NPH at home, she is now up to 48 units twice daily, her blood sugars have been average fasting 100-150.  It appears that she has had very reasonable blood sugars with average fasting around 110-150 when she was on 40 units twice daily.  No hypoglycemia, lowest blood sugar checked has been 76 with good hypoglycemic awareness. Patient is beginning to notice foods that are difficult to manage her blood sugars with.  Anxiety Patient has had a recent death of her dog, she states anxiety is been a little bit worse, request increase in medication.    PMH: Smoking status noted ROS: Per HPI  Objective: BP 124/82   Pulse 96   Temp (!) 97.4 F (36.3 C) (Oral)   Ht 5\' 1"  (1.549 m)   Wt 152 lb 3.2 oz (69 kg)   BMI 28.76 kg/m  Gen: NAD, alert, cooperative with exam HEENT: NCAT CV: RRR, good S1/S2, no murmur Resp: CTABL, no wheezes, non-labored Ext: No edema, warm Neuro: Alert and oriented, No gross deficits  Assessment and plan:  #Type 2 diabetes Fasting blood sugars improved, decrease patient's dose of NPH to 40 units twice daily, this is still in the high side. Discussed that she likely needs NPH plus R insulin, however we will see how this works for her A1c for this 3 months. Return in 2 months, A1c at that time.  #Anxiety Worsened, continue Xanax plus Celexa, increase Xanax slightly   Meds ordered this encounter  Medications  . ALPRAZolam (XANAX) 0.5 MG tablet    Sig: Take 1 tablet (0.5 mg total) 3 (three) times daily as needed by mouth for anxiety.    Dispense:  75 tablet    Refill:  2  . insulin NPH Human (HUMULIN N,NOVOLIN N) 100 UNIT/ML injection    Sig: Inject 0.4 mLs (40 Units total) 2 (two) times daily before a meal into the skin.    Dispense:  30 mL    Refill:  [place], MD [place] 05/23/2017, 3:50 PM

## 2017-05-23 NOTE — Patient Instructions (Signed)
Great to see you! Come back in 2 months unless you need [place] sooner.   Decrease to 40 units twice daily

## 2017-07-23 ENCOUNTER — Ambulatory Visit (INDEPENDENT_AMBULATORY_CARE_PROVIDER_SITE_OTHER): Payer: Self-pay | Admitting: Family Medicine

## 2017-07-23 ENCOUNTER — Encounter: Payer: Self-pay | Admitting: Family Medicine

## 2017-07-23 VITALS — BP 109/77 | HR 96 | Temp 98.6°F | Ht 61.0 in | Wt 155.2 lb

## 2017-07-23 DIAGNOSIS — F419 Anxiety disorder, unspecified: Secondary | ICD-10-CM

## 2017-07-23 DIAGNOSIS — G8929 Other chronic pain: Secondary | ICD-10-CM

## 2017-07-23 DIAGNOSIS — E1165 Type 2 diabetes mellitus with hyperglycemia: Secondary | ICD-10-CM

## 2017-07-23 DIAGNOSIS — M545 Low back pain, unspecified: Secondary | ICD-10-CM

## 2017-07-23 LAB — BAYER DCA HB A1C WAIVED: HB A1C (BAYER DCA - WAIVED): 7.3 % — ABNORMAL HIGH (ref <7.0)

## 2017-07-23 MED ORDER — ALPRAZOLAM 0.5 MG PO TABS: 0.5000 mg | ORAL_TABLET | Freq: Three times a day (TID) | ORAL | 2 refills | Status: DC | PRN

## 2017-07-23 MED ORDER — MELOXICAM 7.5 MG PO TABS
7.5000 mg | ORAL_TABLET | Freq: Every day | ORAL | 3 refills | Status: DC
Start: 2017-07-23 — End: 2017-09-03

## 2017-07-23 NOTE — Patient Instructions (Addendum)
Great to see you!  Com ebcak in 3 months unless you need [place] sooner.   Continue Insulin at 40 units in the morning and 30 units at night

## 2017-07-23 NOTE — Progress Notes (Signed)
   HPI  Patient presents today for follow-up chronic medical conditions.  Patient does not have insurance coverage.  Type 2 diabetes. Patient has been doing well with NPH, she is using 40 units twice daily, she states that she has had a few blood sugars in the 60s at night, this feels very symptomatic with sweating and shaking and feeling of weakness. She is watching her diet. She is not happy with the weight gain. Blood sugar fasting frequently in the 80s and 90s  Right-sided low back pain Previously worked up by orthopedics who recommended MRI. Complains of right sided nonradiating low back pain, she would like to try Celebrex.  Anxiety Doing well with Xanax plus Celexa plus BuSpar.  Excellent needs refill  PMH: Smoking status noted ROS: Per HPI  Objective: BP 109/77   Pulse 96   Temp 98.6 F (37 C) (Oral)   Ht 5\' 1"  (1.549 m)   Wt 155 lb 3.2 oz (70.4 kg)   BMI 29.32 kg/m  Gen: NAD, alert, cooperative with exam HEENT: NCAT CV: RRR, good S1/S2, no murmur Resp: CTABL, no wheezes, non-labored Ext: No edema, warm Neuro: Alert and oriented, No gross deficits  Assessment and plan:  #Type 2 diabetes Fasting blood sugars likely to well controlled, decrease nighttime NPH dose to 30 units, continue 40 units a.m. A1c pending  #Right-sided low back pain without sciatica Trial of meloxicam 7.5 mg No red flags  #Anxiety Doing well with BuSpar plus Xanax plus Celexa Refill Xanax   Orders Placed This Encounter  Procedures  . Bayer DCA Hb A1c Waived    Meds ordered this encounter  Medications  . ALPRAZolam (XANAX) 0.5 MG tablet    Sig: Take 1 tablet (0.5 mg total) by mouth 3 (three) times daily as needed for anxiety.    Dispense:  75 tablet    Refill:  2  . meloxicam (MOBIC) 7.5 MG tablet    Sig: Take 1 tablet (7.5 mg total) by mouth daily.    Dispense:  30 tablet    Refill:  [place], MD [place] 07/23/2017, 4:55  PM

## 2017-09-01 ENCOUNTER — Inpatient Hospital Stay (HOSPITAL_COMMUNITY)
Admission: EM | Disposition: A | Discharge status: Home / Self Care | Payer: Self-pay | Source: Ambulatory Visit | Attending: Cardiology

## 2017-09-01 ENCOUNTER — Other Ambulatory Visit: Payer: Self-pay

## 2017-09-01 ENCOUNTER — Inpatient Hospital Stay (HOSPITAL_COMMUNITY)
Admission: EM | Admit: 2017-09-01 | Discharge: 2017-09-03 | DRG: 247 | Disposition: A | Discharge status: Home / Self Care | Payer: Self-pay | Source: Ambulatory Visit | Attending: Cardiology | Admitting: Cardiology

## 2017-09-01 DIAGNOSIS — Z23 Encounter for immunization: Secondary | ICD-10-CM

## 2017-09-01 DIAGNOSIS — Z794 Long term (current) use of insulin: Secondary | ICD-10-CM

## 2017-09-01 DIAGNOSIS — F419 Anxiety disorder, unspecified: Secondary | ICD-10-CM | POA: Diagnosis present

## 2017-09-01 DIAGNOSIS — E1165 Type 2 diabetes mellitus with hyperglycemia: Secondary | ICD-10-CM

## 2017-09-01 DIAGNOSIS — I10 Essential (primary) hypertension: Secondary | ICD-10-CM | POA: Diagnosis present

## 2017-09-01 DIAGNOSIS — I959 Hypotension, unspecified: Secondary | ICD-10-CM | POA: Diagnosis present

## 2017-09-01 DIAGNOSIS — Z79899 Other long term (current) drug therapy: Secondary | ICD-10-CM

## 2017-09-01 DIAGNOSIS — I2510 Atherosclerotic heart disease of native coronary artery without angina pectoris: Secondary | ICD-10-CM | POA: Diagnosis present

## 2017-09-01 DIAGNOSIS — I213 ST elevation (STEMI) myocardial infarction of unspecified site: Secondary | ICD-10-CM | POA: Diagnosis present

## 2017-09-01 DIAGNOSIS — E119 Type 2 diabetes mellitus without complications: Secondary | ICD-10-CM | POA: Diagnosis present

## 2017-09-01 DIAGNOSIS — G2581 Restless legs syndrome: Secondary | ICD-10-CM | POA: Diagnosis present

## 2017-09-01 DIAGNOSIS — F329 Major depressive disorder, single episode, unspecified: Secondary | ICD-10-CM | POA: Diagnosis present

## 2017-09-01 DIAGNOSIS — I2111 ST elevation (STEMI) myocardial infarction involving right coronary artery: Principal | ICD-10-CM | POA: Diagnosis present

## 2017-09-01 DIAGNOSIS — Z720 Tobacco use: Secondary | ICD-10-CM

## 2017-09-01 DIAGNOSIS — Z8249 Family history of ischemic heart disease and other diseases of the circulatory system: Secondary | ICD-10-CM

## 2017-09-01 DIAGNOSIS — Z90711 Acquired absence of uterus with remaining cervical stump: Secondary | ICD-10-CM

## 2017-09-01 DIAGNOSIS — F32A Depression, unspecified: Secondary | ICD-10-CM | POA: Diagnosis present

## 2017-09-01 DIAGNOSIS — F17210 Nicotine dependence, cigarettes, uncomplicated: Secondary | ICD-10-CM | POA: Diagnosis present

## 2017-09-01 DIAGNOSIS — E785 Hyperlipidemia, unspecified: Secondary | ICD-10-CM | POA: Diagnosis present

## 2017-09-01 HISTORY — PX: LEFT HEART CATH AND CORONARY ANGIOGRAPHY: CATH118249

## 2017-09-01 HISTORY — PX: CORONARY STENT INTERVENTION: CATH118234

## 2017-09-01 HISTORY — DX: Acute myocardial infarction, unspecified: I21.9

## 2017-09-01 HISTORY — PX: CORONARY/GRAFT ACUTE MI REVASCULARIZATION: CATH118305

## 2017-09-01 HISTORY — DX: Tobacco use: Z72.0

## 2017-09-01 HISTORY — DX: Atherosclerotic heart disease of native coronary artery without angina pectoris: I25.10

## 2017-09-01 LAB — COMPREHENSIVE METABOLIC PANEL
ALBUMIN: 3.4 g/dL — AB (ref 3.5–5.0)
ALBUMIN: 3.4 g/dL — AB (ref 3.5–5.0)
ALT: 15 U/L (ref 14–54)
ALT: 18 U/L (ref 14–54)
AST: 25 U/L (ref 15–41)
AST: 59 U/L — ABNORMAL HIGH (ref 15–41)
Alkaline Phosphatase: 74 U/L (ref 38–126)
Alkaline Phosphatase: 73 U/L (ref 38–126)
Anion gap: 13 (ref 5–15)
Anion gap: 10 (ref 5–15)
BILIRUBIN TOTAL: 0.2 mg/dL — AB (ref 0.3–1.2)
BUN: 13 mg/dL (ref 6–20)
BUN: 11 mg/dL (ref 6–20)
CO2: 15 mmol/L — ABNORMAL LOW (ref 22–32)
CO2: 19 mmol/L — ABNORMAL LOW (ref 22–32)
CREATININE: 0.61 mg/dL (ref 0.44–1.00)
Calcium: 8.8 mg/dL — ABNORMAL LOW (ref 8.9–10.3)
Calcium: 9 mg/dL (ref 8.9–10.3)
Chloride: 110 mmol/L (ref 101–111)
Chloride: 110 mmol/L (ref 101–111)
Creatinine, Ser: 0.6 mg/dL (ref 0.44–1.00)
GFR calc Af Amer: >60 mL/min (ref >60)
GFR calc non Af Amer: >60 mL/min (ref >60)
GLUCOSE: 158 mg/dL — AB (ref 65–99)
GLUCOSE: 158 mg/dL — AB (ref 65–99)
POTASSIUM: 4.2 mmol/L (ref 3.5–5.1)
Potassium: 3.9 mmol/L (ref 3.5–5.1)
SODIUM: 139 mmol/L (ref 135–145)
Sodium: 138 mmol/L (ref 135–145)
TOTAL PROTEIN: 6.9 g/dL (ref 6.5–8.1)
TOTAL PROTEIN: 7 g/dL (ref 6.5–8.1)
Total Bilirubin: 0.4 mg/dL (ref 0.3–1.2)

## 2017-09-01 LAB — LIPID PANEL
CHOLESTEROL: 246 mg/dL — AB (ref 0–200)
HDL: 31 mg/dL — ABNORMAL LOW (ref >40)
LDL Cholesterol: 181 mg/dL — ABNORMAL HIGH (ref 0–99)
Total CHOL/HDL Ratio: 7.9 RATIO
Triglycerides: 169 mg/dL — ABNORMAL HIGH (ref <150)
VLDL: 34 mg/dL (ref 0–40)

## 2017-09-01 LAB — TROPONIN I
TROPONIN I: 0.04 ng/mL — AB (ref <0.03)
TROPONIN I: 4.31 ng/mL — AB (ref <0.03)

## 2017-09-01 LAB — CBC WITH DIFFERENTIAL/PLATELET
BASOS ABS: 0 10*3/uL (ref 0.0–0.1)
BASOS PCT: 0 %
EOS ABS: 0.1 10*3/uL (ref 0.0–0.7)
Eosinophils Relative: 1 %
HCT: 42.1 % (ref 36.0–46.0)
HEMOGLOBIN: 13.7 g/dL (ref 12.0–15.0)
LYMPHS ABS: 2.3 10*3/uL (ref 0.7–4.0)
Lymphocytes Relative: 19 %
MCH: 29.8 pg (ref 26.0–34.0)
MCHC: 32.5 g/dL (ref 30.0–36.0)
MCV: 91.7 fL (ref 78.0–100.0)
Monocytes Absolute: 0.5 10*3/uL (ref 0.1–1.0)
Monocytes Relative: 4 %
NEUTROS PCT: 76 %
Neutro Abs: 9.4 10*3/uL — ABNORMAL HIGH (ref 1.7–7.7)
PLATELETS: 299 10*3/uL (ref 150–400)
RBC: 4.59 MIL/uL (ref 3.87–5.11)
RDW: 15.1 % (ref 11.5–15.5)
WBC: 12.3 10*3/uL — ABNORMAL HIGH (ref 4.0–10.5)

## 2017-09-01 LAB — CBC
HCT: 41.3 % (ref 36.0–46.0)
HEMOGLOBIN: 13.9 g/dL (ref 12.0–15.0)
MCH: 29.8 pg (ref 26.0–34.0)
MCHC: 33.7 g/dL (ref 30.0–36.0)
MCV: 88.6 fL (ref 78.0–100.0)
Platelets: 317 10*3/uL (ref 150–400)
RBC: 4.66 MIL/uL (ref 3.87–5.11)
RDW: 14.7 % (ref 11.5–15.5)
WBC: 14.2 10*3/uL — ABNORMAL HIGH (ref 4.0–10.5)

## 2017-09-01 LAB — GLUCOSE, CAPILLARY
GLUCOSE-CAPILLARY: 154 mg/dL — AB (ref 65–99)
GLUCOSE-CAPILLARY: 192 mg/dL — AB (ref 65–99)

## 2017-09-01 LAB — PROTIME-INR
INR: 0.96
PROTHROMBIN TIME: 12.6 s (ref 11.4–15.2)

## 2017-09-01 LAB — MRSA PCR SCREENING: MRSA BY PCR: NEGATIVE

## 2017-09-01 LAB — APTT: APTT: 29 s (ref 24–36)

## 2017-09-01 LAB — MAGNESIUM: Magnesium: 1.8 mg/dL (ref 1.7–2.4)

## 2017-09-01 SURGERY — CORONARY/GRAFT ACUTE MI REVASCULARIZATION
Anesthesia: LOCAL

## 2017-09-01 MED ORDER — IOPAMIDOL (ISOVUE-370) INJECTION 76%
INTRAVENOUS | Status: DC | PRN
  Administered 2017-09-01: 150 mL via INTRA_ARTERIAL

## 2017-09-01 MED ORDER — BUSPIRONE HCL 5 MG PO TABS
10.0000 mg | ORAL_TABLET | Freq: Three times a day (TID) | ORAL | Status: DC
  Administered 2017-09-01 – 2017-09-03 (×6): 10 mg via ORAL
  Filled 2017-09-01 (×6): qty 2

## 2017-09-01 MED ORDER — SODIUM CHLORIDE 0.9 % IV SOLN: 250.0000 mL | INTRAVENOUS | Status: DC | PRN

## 2017-09-01 MED ORDER — HEPARIN SODIUM (PORCINE) 1000 UNIT/ML IJ SOLN
INTRAMUSCULAR | Status: DC | PRN
  Administered 2017-09-01: 3000 [IU] via INTRAVENOUS
  Administered 2017-09-01: 7000 [IU] via INTRAVENOUS

## 2017-09-01 MED ORDER — ACETAMINOPHEN 325 MG PO TABS
650.0000 mg | ORAL_TABLET | ORAL | Status: DC | PRN
  Administered 2017-09-02: 650 mg via ORAL
  Filled 2017-09-01 (×2): qty 2

## 2017-09-01 MED ORDER — LIDOCAINE HCL (PF) 1 % IJ SOLN
INTRAMUSCULAR | Status: DC | PRN
  Administered 2017-09-01: 3 mL via INTRADERMAL

## 2017-09-01 MED ORDER — TICAGRELOR 90 MG PO TABS
ORAL_TABLET | ORAL | Status: AC
  Filled 2017-09-01: qty 2

## 2017-09-01 MED ORDER — CITALOPRAM HYDROBROMIDE 20 MG PO TABS
40.0000 mg | ORAL_TABLET | Freq: Every day | ORAL | Status: DC
  Administered 2017-09-01 – 2017-09-03 (×3): 40 mg via ORAL
  Filled 2017-09-01 (×3): qty 2

## 2017-09-01 MED ORDER — MIDAZOLAM HCL 2 MG/2ML IJ SOLN
INTRAMUSCULAR | Status: AC
  Filled 2017-09-01: qty 2

## 2017-09-01 MED ORDER — LIDOCAINE HCL (PF) 1 % IJ SOLN
INTRAMUSCULAR | Status: AC
  Filled 2017-09-01: qty 30

## 2017-09-01 MED ORDER — HEPARIN (PORCINE) IN NACL 2-0.9 UNIT/ML-% IJ SOLN
INTRAMUSCULAR | Status: AC | PRN
  Administered 2017-09-01 (×2): 500 mL

## 2017-09-01 MED ORDER — ONDANSETRON HCL 4 MG/2ML IJ SOLN
4.0000 mg | Freq: Four times a day (QID) | INTRAMUSCULAR | Status: DC | PRN
  Administered 2017-09-01: 4 mg via INTRAVENOUS
  Filled 2017-09-01: qty 2

## 2017-09-01 MED ORDER — PNEUMOCOCCAL VAC POLYVALENT 25 MCG/0.5ML IJ INJ
0.5000 mL | INJECTION | INTRAMUSCULAR | Status: AC
  Administered 2017-09-02: 0.5 mL via INTRAMUSCULAR
  Filled 2017-09-01: qty 0.5

## 2017-09-01 MED ORDER — GABAPENTIN 800 MG PO TABS
800.0000 mg | ORAL_TABLET | Freq: Four times a day (QID) | ORAL | Status: DC
  Filled 2017-09-01 (×2): qty 1

## 2017-09-01 MED ORDER — NITROGLYCERIN 1 MG/10 ML FOR IR/CATH LAB
INTRA_ARTERIAL | Status: AC
  Filled 2017-09-01: qty 10

## 2017-09-01 MED ORDER — ASPIRIN 81 MG PO CHEW
81.0000 mg | CHEWABLE_TABLET | Freq: Every day | ORAL | Status: DC
  Administered 2017-09-01 – 2017-09-03 (×3): 81 mg via ORAL
  Filled 2017-09-01 (×3): qty 1

## 2017-09-01 MED ORDER — SODIUM CHLORIDE 0.9% FLUSH
3.0000 mL | Freq: Two times a day (BID) | INTRAVENOUS | Status: DC
  Administered 2017-09-01 – 2017-09-03 (×4): 3 mL via INTRAVENOUS

## 2017-09-01 MED ORDER — ATORVASTATIN CALCIUM 80 MG PO TABS
80.0000 mg | ORAL_TABLET | Freq: Every day | ORAL | Status: DC
  Administered 2017-09-01 – 2017-09-02 (×2): 80 mg via ORAL
  Filled 2017-09-01 (×2): qty 1

## 2017-09-01 MED ORDER — HEPARIN (PORCINE) IN NACL 2-0.9 UNIT/ML-% IJ SOLN
INTRAMUSCULAR | Status: AC
  Filled 2017-09-01: qty 1000

## 2017-09-01 MED ORDER — IOPAMIDOL (ISOVUE-370) INJECTION 76%
INTRAVENOUS | Status: AC
  Filled 2017-09-01: qty 150

## 2017-09-01 MED ORDER — TICAGRELOR 90 MG PO TABS
90.0000 mg | ORAL_TABLET | Freq: Two times a day (BID) | ORAL | Status: DC
  Administered 2017-09-01 – 2017-09-03 (×4): 90 mg via ORAL
  Filled 2017-09-01 (×4): qty 1

## 2017-09-01 MED ORDER — INSULIN ASPART 100 UNIT/ML ~~LOC~~ SOLN
0.0000 [IU] | Freq: Three times a day (TID) | SUBCUTANEOUS | Status: DC
  Administered 2017-09-01 – 2017-09-03 (×3): 3 [IU] via SUBCUTANEOUS
  Administered 2017-09-03: 2 [IU] via SUBCUTANEOUS

## 2017-09-01 MED ORDER — ENOXAPARIN SODIUM 40 MG/0.4ML ~~LOC~~ SOLN
40.0000 mg | SUBCUTANEOUS | Status: DC
  Administered 2017-09-02 – 2017-09-03 (×2): 40 mg via SUBCUTANEOUS
  Filled 2017-09-01 (×2): qty 0.4

## 2017-09-01 MED ORDER — TICAGRELOR 90 MG PO TABS
ORAL_TABLET | ORAL | Status: DC | PRN
  Administered 2017-09-01: 180 mg via ORAL

## 2017-09-01 MED ORDER — VERAPAMIL HCL 2.5 MG/ML IV SOLN
INTRAVENOUS | Status: AC
  Filled 2017-09-01: qty 2

## 2017-09-01 MED ORDER — VERAPAMIL HCL 2.5 MG/ML IV SOLN
INTRAVENOUS | Status: DC | PRN
  Administered 2017-09-01: 10 mL via INTRA_ARTERIAL

## 2017-09-01 MED ORDER — HEPARIN SODIUM (PORCINE) 1000 UNIT/ML IJ SOLN
INTRAMUSCULAR | Status: AC
  Filled 2017-09-01: qty 1

## 2017-09-01 MED ORDER — SODIUM CHLORIDE 0.9% FLUSH: 3.0000 mL | INTRAVENOUS | Status: DC | PRN

## 2017-09-01 MED ORDER — ALPRAZOLAM 0.5 MG PO TABS
0.5000 mg | ORAL_TABLET | Freq: Three times a day (TID) | ORAL | Status: DC | PRN
  Administered 2017-09-01: 0.5 mg via ORAL
  Filled 2017-09-01: qty 1

## 2017-09-01 MED ORDER — DOPAMINE-DEXTROSE 3.2-5 MG/ML-% IV SOLN
INTRAVENOUS | Status: AC
  Filled 2017-09-01: qty 250

## 2017-09-01 MED ORDER — CARVEDILOL 6.25 MG PO TABS
6.2500 mg | ORAL_TABLET | Freq: Two times a day (BID) | ORAL | Status: DC
  Administered 2017-09-01 – 2017-09-03 (×3): 6.25 mg via ORAL
  Filled 2017-09-01 (×4): qty 1

## 2017-09-01 MED ORDER — SODIUM CHLORIDE 0.9 % IV SOLN
INTRAVENOUS | Status: AC
  Administered 2017-09-01: 50 mL/h via INTRAVENOUS

## 2017-09-01 MED ORDER — INSULIN NPH (HUMAN) (ISOPHANE) 100 UNIT/ML ~~LOC~~ SUSP
20.0000 [IU] | Freq: Two times a day (BID) | SUBCUTANEOUS | Status: DC
  Administered 2017-09-01 – 2017-09-03 (×4): 20 [IU] via SUBCUTANEOUS
  Filled 2017-09-01 (×2): qty 10

## 2017-09-01 MED ORDER — NITROGLYCERIN 0.4 MG SL SUBL
0.4000 mg | SUBLINGUAL_TABLET | SUBLINGUAL | Status: DC | PRN
  Administered 2017-09-01: 0.4 mg via SUBLINGUAL
  Filled 2017-09-01: qty 1

## 2017-09-01 MED ORDER — ENOXAPARIN SODIUM 40 MG/0.4ML ~~LOC~~ SOLN: 40.0000 mg | SUBCUTANEOUS | Status: DC

## 2017-09-01 MED ORDER — MIDAZOLAM HCL 2 MG/2ML IJ SOLN
INTRAMUSCULAR | Status: DC | PRN
  Administered 2017-09-01: 2 mg via INTRAVENOUS

## 2017-09-01 MED ORDER — DOPAMINE-DEXTROSE 3.2-5 MG/ML-% IV SOLN
0.0000 ug/kg/min | Freq: Once | INTRAVENOUS | Status: DC
  Administered 2017-09-01: 5 ug/kg/min via INTRAVENOUS

## 2017-09-01 MED ORDER — NITROGLYCERIN 1 MG/10 ML FOR IR/CATH LAB
INTRA_ARTERIAL | Status: DC | PRN
  Administered 2017-09-01: 200 ug via INTRACORONARY

## 2017-09-01 MED ORDER — GABAPENTIN 400 MG PO CAPS
800.0000 mg | ORAL_CAPSULE | Freq: Four times a day (QID) | ORAL | Status: DC
  Administered 2017-09-01 – 2017-09-03 (×8): 800 mg via ORAL
  Filled 2017-09-01 (×8): qty 2

## 2017-09-01 SURGICAL SUPPLY — 23 items
BALLN SAPPHIRE 2.0X12 (BALLOONS) ×2
BALLN SAPPHIRE ~~LOC~~ 2.75X18 (BALLOONS) ×2 IMPLANT
BALLOON SAPPHIRE 2.0X12 (BALLOONS) ×1 IMPLANT
CATH 5FR JL3.5 JR4 ANG PIG MP (CATHETERS) ×2 IMPLANT
CATH LAUNCHER 6FR AL1 (CATHETERS) ×1 IMPLANT
CATH VISTA GUIDE 6FR JR4 (CATHETERS) ×2 IMPLANT
CATHETER LAUNCHER 6FR AL1 (CATHETERS) ×2
COVER PRB 48X5XTLSCP FOLD TPE (BAG) ×1 IMPLANT
COVER PROBE 5X48 (BAG) ×1
DEVICE RAD COMP TR BAND LRG (VASCULAR PRODUCTS) ×2 IMPLANT
ELECT DEFIB PAD ADLT CADENCE (PAD) ×2 IMPLANT
GLIDESHEATH SLEND SS 6F .021 (SHEATH) ×2 IMPLANT
GUIDEWIRE INQWIRE 1.5J.035X260 (WIRE) ×1 IMPLANT
INQWIRE 1.5J .035X260CM (WIRE) ×2
KIT ENCORE 26 ADVANTAGE (KITS) ×2 IMPLANT
KIT HEART LEFT (KITS) ×2 IMPLANT
PACK CARDIAC CATHETERIZATION (CUSTOM PROCEDURE TRAY) ×2 IMPLANT
STENT SYNERGY DES 2.5X32 (Permanent Stent) ×2 IMPLANT
SYR MEDRAD MARK V 150ML (SYRINGE) ×2 IMPLANT
TRANSDUCER W/STOPCOCK (MISCELLANEOUS) ×2 IMPLANT
TUBING CIL FLEX 10 FLL-RA (TUBING) ×2 IMPLANT
WIRE ASAHI PROWATER 180CM (WIRE) ×2 IMPLANT
WIRE PT2 MS 185 (WIRE) ×2 IMPLANT

## 2017-09-01 NOTE — H&P (Signed)
Cardiology Admission History and Physical:   Patient ID: Melissa Rubio; MRN: [id number]; DOB: [birth year]   Admission date: 09/01/2017  Primary Care Provider: Timmothy Euler, MD Primary Cardiologist:New to [facility]  Chief Complaint:  CP  Patient Profile:   Melissa Rubio is a 47 y.o. female with a history of hypertension hyperlipidemia and ongoing tobacco abuse presented with code STEMI.   History of Present Illness:   Melissa Rubio  has ongoing chest pain for the past 4 days.  Constant since this morning today. Associated with shortness of breath.  No radiation no radiation.  EMS was called and found to have a inferior STEMI and taken to Cath Lab.  10 out of 10 chest pain during presentation.  Patient has received aspirin 324 mg, sublingual nitroglycerin x 2 without minimally improved  Denies prior history of allergy to dye.  She still has a history of CAD.   Past Medical History:  Diagnosis Date  . Anxiety   . Cancer ([place]) 1994   ovarian  . Diabetes ([place])   . Hypertension   . Itching   . RLS (restless legs syndrome)     Past Surgical History:  Procedure Laterality Date  . PARTIAL HYSTERECTOMY  2009     Medications Prior to Admission: Prior to Admission medications   Medication Sig Start Date End Date Taking? Authorizing Provider  ALPRAZolam Duanne Moron) 0.5 MG tablet Take 1 tablet (0.5 mg total) by mouth 3 (three) times daily as needed for anxiety. 07/23/17   Timmothy Euler, MD  busPIRone (BUSPAR) 10 MG tablet Take 1 tablet (10 mg total) by mouth 3 (three) times daily. 03/12/17   Claretta Fraise, MD  citalopram (CELEXA) 40 MG tablet Take 1 tablet (40 mg total) by mouth daily. 02/06/17   Claretta Fraise, MD  cyclobenzaprine (FLEXERIL) 10 MG tablet Take 1 tablet (10 mg total) by mouth 3 (three) times daily as needed for muscle spasms. 04/11/17   Timmothy Euler, MD  gabapentin (NEURONTIN) 800 MG tablet Take 1 tablet (800 mg total) by mouth 4 (four) times daily. 04/11/17    Timmothy Euler, MD  hydrOXYzine (ATARAX/VISTARIL) 50 MG tablet Take 1 tablet (50 mg total) by mouth every 8 (eight) hours as needed. 02/06/17   Claretta Fraise, MD  ibuprofen (ADVIL,MOTRIN) 800 MG tablet TAKE ONE TABLET BY MOUTH EVERY 8 HOURS AS NEEDED 03/14/17   Claretta Fraise, MD  insulin NPH Human (HUMULIN N,NOVOLIN N) 100 UNIT/ML injection Inject 0.4 mLs (40 Units total) 2 (two) times daily before a meal into the skin. 05/23/17   Timmothy Euler, MD  INSULIN SYRINGE .5CC/29G 29G X 1/2" 0.5 ML MISC 1 Syringe by Does not apply route 2 (two) times daily. 04/11/17   Timmothy Euler, MD  lisinopril-hydrochlorothiazide (ZESTORETIC) 20-12.5 MG tablet Take 1 tablet by mouth daily. 04/11/17   Timmothy Euler, MD  meloxicam (MOBIC) 7.5 MG tablet Take 1 tablet (7.5 mg total) by mouth daily. 07/23/17   Timmothy Euler, MD  metFORMIN (GLUCOPHAGE) 1000 MG tablet Take 1 tablet (1,000 mg total) by mouth 2 (two) times daily with a meal. 02/06/17   Claretta Fraise, MD     Allergies:   No Known Allergies  Social History:   Social History   Socioeconomic History  . Marital status: Legally Separated    Spouse name: Not on file  . Number of children: Not on file  . Years of education: Not on file  . Highest education level: Not on file  Social Needs  . Financial resource strain: Not on file  . Food insecurity - worry: Not on file  . Food insecurity - inability: Not on file  . Transportation needs - medical: Not on file  . Transportation needs - non-medical: Not on file  Occupational History  . Not on file  Tobacco Use  . Smoking status: Heavy Tobacco Smoker    Packs/day: 0.50  . Smokeless tobacco: Never Used  Substance and Sexual Activity  . Alcohol use: Yes  . Drug use: No  . Sexual activity: Not on file  Other Topics Concern  . Not on file  Social History Narrative  . Not on file    Family History:   The patient's family history is not on file.   Sister has history of CAD  ROS:    Please see the history of present illness.  All other ROS reviewed and negative.     Physical Exam/Data:   Vitals:   09/01/17 1548  SpO2: 100%   No intake or output data in the 24 hours ending 09/01/17 1552 There were no vitals filed for this visit. There is no height or weight on file to calculate BMI.  General:  Well nourished, well developed, in no acute distress HEENT: normal Lymph: no adenopathy Neck: noJVD Endocrine:  No thryomegaly Vascular: No carotid bruits; FA pulses 2+ bilaterally without bruits  Cardiac:  normal S1, S2; RRR; no murmur  Lungs:  clear to auscultation bilaterally, no wheezing, rhonchi or rales  Abd: soft, nontender, no hepatomegaly  Ext: no edema Musculoskeletal:  No deformities, BUE and BLE strength normal and equal Skin: warm and dry  Neuro:  CNs 2-12 intact, no focal abnormalities noted Psych:  Normal affect    EKG:  The ECG that was done was personally reviewed and demonstrates: inferior STEMI  Relevant CV Studies: Pending cath   Laboratory Data:  Radiology/Studies:  No results found.  Assessment and Plan:   1. Inferior STEMI Ongoing chest pain for past 4 days.  Worsened today.  Pending cardiac catheterization.  2.  Ongoing tobacco abuse -She need to quit.  3. HTN - Follow  4. DM - SSI   Severity of Illness: The appropriate patient status for this patient is INPATIENT. Inpatient status is judged to be reasonable and necessary in order to provide the required intensity of service to ensure the patient's safety. The patient's presenting symptoms, physical exam findings, and initial radiographic and laboratory data in the context of their chronic comorbidities is felt to place them at high risk for further clinical deterioration. Furthermore, it is not anticipated that the patient will be medically stable for discharge from the hospital within 2 midnights of admission. The following factors support the patient status of inpatient.    " The patient's presenting symptoms include - Chest pressure " The worrisome physical exam findings include none " The initial radiographic and laboratory data are worrisome because of inferior stemi " The chronic co-morbidities include smoking, HTN, DM   * I certify that at the point of admission it is my clinical judgment that the patient will require inpatient hospital care spanning beyond 2 midnights from the point of admission due to high intensity of service, high risk for further deterioration and high frequency of surveillance required.*    For questions or updates, please contact [address] Please consult [email or web address] for contact info under Cardiology/STEMI.    Jarrett Soho, [place]  09/01/2017 3:52 PM

## 2017-09-02 ENCOUNTER — Inpatient Hospital Stay (HOSPITAL_COMMUNITY): Payer: Self-pay

## 2017-09-02 ENCOUNTER — Encounter (HOSPITAL_COMMUNITY): Payer: Self-pay | Admitting: Cardiology

## 2017-09-02 DIAGNOSIS — E782 Mixed hyperlipidemia: Secondary | ICD-10-CM

## 2017-09-02 DIAGNOSIS — I2111 ST elevation (STEMI) myocardial infarction involving right coronary artery: Secondary | ICD-10-CM

## 2017-09-02 DIAGNOSIS — I10 Essential (primary) hypertension: Secondary | ICD-10-CM

## 2017-09-02 LAB — GLUCOSE, CAPILLARY
GLUCOSE-CAPILLARY: 112 mg/dL — AB (ref 65–99)
Glucose-Capillary: 89 mg/dL (ref 65–99)
Glucose-Capillary: 157 mg/dL — ABNORMAL HIGH (ref 65–99)
Glucose-Capillary: 166 mg/dL — ABNORMAL HIGH (ref 65–99)

## 2017-09-02 LAB — CBC
HEMATOCRIT: 39 % (ref 36.0–46.0)
HEMOGLOBIN: 12.6 g/dL (ref 12.0–15.0)
MCH: 29.6 pg (ref 26.0–34.0)
MCHC: 32.3 g/dL (ref 30.0–36.0)
MCV: 91.5 fL (ref 78.0–100.0)
Platelets: 294 10*3/uL (ref 150–400)
RBC: 4.26 MIL/uL (ref 3.87–5.11)
RDW: 15 % (ref 11.5–15.5)
WBC: 13.5 10*3/uL — ABNORMAL HIGH (ref 4.0–10.5)

## 2017-09-02 LAB — LIPID PANEL
CHOL/HDL RATIO: 8.3 ratio
Cholesterol: 217 mg/dL — ABNORMAL HIGH (ref 0–200)
HDL: 26 mg/dL — ABNORMAL LOW (ref >40)
LDL Cholesterol: 147 mg/dL — ABNORMAL HIGH (ref 0–99)
Triglycerides: 222 mg/dL — ABNORMAL HIGH (ref <150)
VLDL: 44 mg/dL — ABNORMAL HIGH (ref 0–40)

## 2017-09-02 LAB — POCT ACTIVATED CLOTTING TIME
Activated Clotting Time: 257 s
Activated Clotting Time: 290 s

## 2017-09-02 LAB — BASIC METABOLIC PANEL
ANION GAP: 9 (ref 5–15)
BUN: 12 mg/dL (ref 6–20)
CALCIUM: 9.1 mg/dL (ref 8.9–10.3)
CO2: 21 mmol/L — ABNORMAL LOW (ref 22–32)
Chloride: 108 mmol/L (ref 101–111)
Creatinine, Ser: 0.6 mg/dL (ref 0.44–1.00)
Glucose, Bld: 105 mg/dL — ABNORMAL HIGH (ref 65–99)
POTASSIUM: 3.8 mmol/L (ref 3.5–5.1)
Sodium: 138 mmol/L (ref 135–145)

## 2017-09-02 LAB — HEMOGLOBIN A1C
Hgb A1c MFr Bld: 6.7 % — ABNORMAL HIGH (ref 4.8–5.6)
Hgb A1c MFr Bld: 6.7 % — ABNORMAL HIGH (ref 4.8–5.6)
MEAN PLASMA GLUCOSE: 146 mg/dL
Mean Plasma Glucose: 146 mg/dL

## 2017-09-02 LAB — TROPONIN I
Troponin I: 19.35 ng/mL (ref <0.03)
Troponin I: 15.51 ng/mL (ref <0.03)

## 2017-09-02 LAB — ECHOCARDIOGRAM COMPLETE
HEIGHTINCHES: 61 in
WEIGHTICAEL: 2652.5747 [oz_av]

## 2017-09-02 LAB — HIV ANTIBODY (ROUTINE TESTING W REFLEX): HIV SCREEN 4TH GENERATION: NONREACTIVE

## 2017-09-02 MED ORDER — NICOTINE 14 MG/24HR TD PT24
14.0000 mg | MEDICATED_PATCH | Freq: Every day | TRANSDERMAL | Status: DC | PRN
  Administered 2017-09-02: 14 mg via TRANSDERMAL
  Filled 2017-09-02: qty 1

## 2017-09-02 MED ORDER — EXERCISE FOR HEART AND HEALTH BOOK
Freq: Once | Status: DC
  Filled 2017-09-02: qty 1

## 2017-09-02 MED ORDER — HEART ATTACK BOUNCING BOOK
Freq: Once | Status: AC
  Administered 2017-09-02: 11:00:00
  Filled 2017-09-02: qty 1

## 2017-09-02 MED FILL — Nitroglycerin IV Soln 100 MCG/ML in D5W: INTRA_ARTERIAL | Qty: 10 | Status: AC

## 2017-09-02 MED FILL — Heparin Sodium (Porcine) 2 Unit/ML in Sodium Chloride 0.9%: INTRAMUSCULAR | Qty: 1000 | Status: AC

## 2017-09-02 NOTE — Plan of Care (Signed)
  Progressing Education: Knowledge of General Education information will improve 09/02/2017 0839 - Progressing by Vickey Sages, RN Health Behavior/Discharge Planning: Ability to manage health-related needs will improve 09/02/2017 0839 - Progressing by Vickey Sages, RN Clinical Measurements: Ability to maintain clinical measurements within normal limits will improve 09/02/2017 0839 - Progressing by Vickey Sages, RN Will remain free from infection 09/02/2017 0839 - Progressing by Vickey Sages, RN Diagnostic test results will improve 09/02/2017 0839 - Progressing by Vickey Sages, RN Respiratory complications will improve 09/02/2017 0839 - Progressing by Vickey Sages, RN Cardiovascular complication will be avoided 09/02/2017 0839 - Progressing by Vickey Sages, RN Activity: Risk for activity intolerance will decrease 09/02/2017 0839 - Progressing by Vickey Sages, RN Nutrition: Adequate nutrition will be maintained 09/02/2017 0839 - Progressing by Vickey Sages, RN Coping: Level of anxiety will decrease 09/02/2017 0839 - Progressing by Vickey Sages, RN Elimination: Will not experience complications related to bowel motility 09/02/2017 0839 - Progressing by Vickey Sages, RN Will not experience complications related to urinary retention 09/02/2017 0839 - Progressing by Vickey Sages, RN Pain Managment: General experience of comfort will improve 09/02/2017 0839 - Progressing by Vickey Sages, RN Safety: Ability to remain free from injury will improve 09/02/2017 0839 - Progressing by Vickey Sages, RN Skin Integrity: Risk for impaired skin integrity will decrease 09/02/2017 0839 - Progressing by Vickey Sages, RN Education: Understanding of CV disease, CV risk reduction, and recovery process will improve 09/02/2017 0839 - Progressing by Vickey Sages,  RN Activity: Ability to return to baseline activity level will improve 09/02/2017 0839 - Progressing by Vickey Sages, RN Cardiovascular: Ability to achieve and maintain adequate cardiovascular perfusion will improve 09/02/2017 0839 - Progressing by Vickey Sages, RN Vascular access site(s) Level 0-1 will be maintained 09/02/2017 0839 - Progressing by Vickey Sages, RN Health Behavior/Discharge Planning: Ability to safely manage health-related needs after discharge will improve 09/02/2017 0839 - Progressing by Vickey Sages, RN

## 2017-09-02 NOTE — Care Management Note (Signed)
Case Management Note [name] RN, BSN Unit 4E-Case Manager--2H coverage [phone]   Patient Details  Name: Melissa Rubio MRN: [id number] Date of Birth: [birth year]  Subjective/Objective:    Pt admitted with STEMI s/p DES                Action/Plan: PTA pt lived at home- independent- pt started on brilinta- spoke with pt at bedside- confirmed that pt has no insurance- pt does state that she has a PCP- Dr. Wendi Snipes- she however would like a cardiologist in [name]- will check with Cards team to see if they plan to set pt up with f/u with [name] office. Discussed brilinta with pt - provided pt 30 day free card along with application for pt assistance- pt has application at bedside- will see about having provider fill out provider section prior to discharge- CM will follow for any further medication needs for [name] if needed.   Expected Discharge Date:                  Expected Discharge Plan:  Home/Self Care  In-House Referral:     Discharge planning Services  CM Consult, Medication Assistance  Post Acute Care Choice:    Choice offered to:     DME Arranged:    DME Agency:     HH Arranged:    HH Agency:     Status of Service:  In process, will continue to follow  If discussed at Long Length of Stay Meetings, dates discussed:    Additional Comments:  [name], RN 09/02/2017, 8:11 PM

## 2017-09-02 NOTE — Progress Notes (Signed)
  Echocardiogram 2D Echocardiogram has been performed.  Jannett Celestine 09/02/2017, 8:57 AM

## 2017-09-02 NOTE — Progress Notes (Signed)
CARDIAC REHAB PHASE I   PRE:  Rate/Rhythm: 64 SR    BP: sitting 106/79    SaO2: 97 RA  MODE:  Ambulation: 290 ft   POST:  Rate/Rhythm: 93 SR    BP: sitting 93/60     SaO2: 97 RA  Pt first time up, slightly unsteady/dizzy. Slow walk with min assist. Sts she is SOB with activity, which was not present PTA. To BR then recliner. Began ed with pt and family. She was thankful for information and loved the fake cigarette. Sts she will try to quit smoking. We discussed all risk factors, MI, Brilinta. Not sure CRPII will be appropriate since she needs more revascularization. Pt does not seem aware of the need for CABG although she does know she has other blockages. Will f/u. [place], ACSM 09/02/2017 2:11 PM

## 2017-09-02 NOTE — Progress Notes (Addendum)
Progress Note  Patient Name: Melissa Rubio Date of Encounter: 09/02/2017 Primary Cardiologist: No primary care provider on file.   Subjective   Feels well today and is without complaint. Had brief episode of mild chest pain which resolved on its own. Has lots of questions regarding risk factors Wants cardiologist near [place].  Inpatient Medications    Scheduled Meds: . aspirin  81 mg Oral Daily  . atorvastatin  80 mg Oral q1800  . busPIRone  10 mg Oral TID  . carvedilol  6.25 mg Oral BID WC  . citalopram  40 mg Oral Daily  . enoxaparin (LOVENOX) injection  40 mg Subcutaneous Q24H  . gabapentin  800 mg Oral QID  . insulin aspart  0-15 Units Subcutaneous TID WC  . insulin NPH Human  20 Units Subcutaneous BID AC & HS  . pneumococcal 23 valent vaccine  0.5 mL Intramuscular Tomorrow-1000  . sodium chloride flush  3 mL Intravenous Q12H  . ticagrelor  90 mg Oral BID   Continuous Infusions: . sodium chloride     PRN Meds: sodium chloride, acetaminophen, ALPRAZolam, nitroGLYCERIN, ondansetron (ZOFRAN) IV, sodium chloride flush   Vital Signs    Vitals:   09/02/17 0630 09/02/17 0700 09/02/17 0800 09/02/17 0817  BP: 107/79 96/76    Pulse: 72 82 72   Resp: 16 14 14    Temp:    98.7 F (37.1 C)  TempSrc:    Oral  SpO2: 95% 98% 96%   Weight:      Height:        Intake/Output Summary (Last 24 hours) at 09/02/2017 0824 Last data filed at 09/02/2017 0700 Gross per 24 hour  Intake 896.67 ml  Output 2450 ml  Net -1553.33 ml   Filed Weights   09/01/17 2019  Weight: 165 lb 12.6 oz (75.2 kg)    Telemetry    NSR. Occasional non-sustained vtach, ~4 beats - Personally Reviewed  ECG    NSR with rate 62. Q-waves and TWI in II, III, aVF. No acute ST changes. - Personally Reviewed  Physical Exam   GEN: Pleasant caucasian female sitting upright in bed eating breakfast. 2 family members at bedside. No acute distress.  Poor health literacy Neck: No JVD Cardiac: RRR, no murmurs,  rubs, or gallops.  Respiratory: Diminished throughout but without wheezes or crackles GI: Soft, nontender, non-distended. +bs MS: No edema; No deformity. Extremities warm and perfused. Neuro:  Nonfocal, moves extremities spontaneously.  Psych: Normal affect, responds to questions appropriately.   Labs    Chemistry Recent Labs  Lab 09/01/17 1553 09/01/17 1857  NA 138 139  K 3.9 4.2  CL 110 110  CO2 15* 19*  GLUCOSE 158* 158*  BUN 13 11  CREATININE 0.61 0.60  CALCIUM 8.8* 9.0  PROT 6.9 7.0  ALBUMIN 3.4* 3.4*  AST 25 59*  ALT 15 18  ALKPHOS 74 73  BILITOT 0.4 0.2*  GFRNONAA >60 >60  GFRAA >60 >60  ANIONGAP 13 10     Hematology Recent Labs  Lab 09/01/17 1553 09/01/17 1857 09/02/17 0755  WBC 14.2* 12.3* 13.5*  RBC 4.66 4.59 4.26  HGB 13.9 13.7 12.6  HCT 41.3 42.1 39.0  MCV 88.6 91.7 91.5  MCH 29.8 29.8 29.6  MCHC 33.7 32.5 32.3  RDW 14.7 15.1 15.0  PLT 317 299 294    Cardiac Enzymes Recent Labs  Lab 09/01/17 1553 09/01/17 1857 09/02/17 0231  TROPONINI 0.04* 4.31* 19.35*   No results for input(s): TROPIPOC in  the last 168 hours.   Results for ALDINE, CHAKRABORTY (MRN [id number]) as of 09/02/2017 08:35  Ref. Range 09/01/2017 15:53 09/02/2017 02:31  Total CHOL/HDL Ratio Latest Units: RATIO 7.9 8.3  Cholesterol Latest Ref Range: 0 - 200 mg/dL 246 (H) 217 (H)  HDL Cholesterol Latest Ref Range: >40 mg/dL 31 (L) 26 (L)  LDL (calc) Latest Ref Range: 0 - 99 mg/dL 181 (H) 147 (H)  Triglycerides Latest Ref Range: <150 mg/dL 169 (H) 222 (H)  VLDL Latest Ref Range: 0 - 40 mg/dL 34 44 (H)    Radiology    No results found.  Cardiac Studies   LHC 09/01/17: Coronary Diagrams   Diagnostic Diagram       Post-Intervention Diagram       *Inferior wall motion abnormality with preserved EF.  TTE 09/02/17: PENDING  Patient Profile     47 y.o. female with IDDM, HTN, HLD and ongoing tobacco use who presented 09/01/17 with several day history of CP. On arrival to ED  she had 10/10 CP which was minimally improved with SL nitro x2 and ASA 324mg  and was found to have inferior STEMI. She was emergently taken to cath lab which revealed 3-vessel obstructive CAD w/ 100% stenosis of distal RCA s/p DES, 50% stenosis distal left main, 65% ostial LAD and 80% ostial LCx. Chest pain has resolved   Assessment & Plan    Inferior STEMI CAD s/p DES distal RCA, 50% stenosis distal left main, 65% ostial LAD and 80% stenosis Ostial LCx Pt s/p DES placement to distal RCA and is currently on ASA and Brilinta which will need to be continued at least 1 yr. She has additional stenosis as noted above and LCA disease not felt to be amenable to PCI. Currently being medically managed and need to d/w interventional cardiology re: CABG after recovery from this inferior MI. LHC showed focal wall motion abnormality but overall preserved EF, TTE pending. She will need aggressive RF modification going forward.  -TTE pending -LCA disease not amenable to PCI and is currently being medically managed. Consider CABG after recovery from inferior MI.  -Continue ASA, Brilinta x1 year minimum  -Continue high-intensity statin -Continue BB but BP currently limiting -Cardiac rehab ordered -Smoking cessation provided, nicotine patch ordered prn at patient request -Pt requests cardiologist in [place].   DM2: On NPH Insulin 40 units BID and Metformin 2000mg  total daily at home. On modified insulin regimen while admitted. Tells me A1c was recently>14%. So far not requiring much sliding scale. -Ordering HbA1c for follow-up -Continue current management -Asked for nutrition consult, patient with lots of questions re: DM and HLD  HTN: On Lisinopril-HCTZ 20-12.5mg  at home, held on admit due to hypotension during LHC briefly requiring pressure support with Dopamine. Received Coreg 6.25mg  last evening, pressures remain soft. -Resume ACE-I when BP can tolerate -Continue BB, adjust as needed for BP/hr  HLD: Lipid  panel on admission shows total cholesterol 246, HDL 31 and LDL 181. Does not appear to have been on statin at home. Started on high-intensity statin during admission and will need significant reduction.  -Continue Atorvastatin 80mg  -LDL goal at least <70  Anxiety/Depression: On Celexa 40mg  and Buspar 10mg  TID at home which have been continued here.   DISPOSITION: Home within the next few days. Patient needs CARDIOLOGIST in [place].   For questions or updates, please contact [place] Please consult [email or web address] for contact info under Cardiology/STEMI.   Signed, Bethany Molt, DO  09/02/2017, 8:24 AM    I  have examined the patient and reviewed assessment and plan and discussed with patient.  Agree with above as stated.  Patient with multivessel disease.  Plan for medical therapy for a few months and then consideration for CABG in a few months given distal left main disease not favorable for PCI.  Plan to move to tele today.  Larae Grooms

## 2017-09-03 ENCOUNTER — Encounter (HOSPITAL_COMMUNITY): Payer: Self-pay | Admitting: Cardiology

## 2017-09-03 DIAGNOSIS — Z720 Tobacco use: Secondary | ICD-10-CM

## 2017-09-03 DIAGNOSIS — E1159 Type 2 diabetes mellitus with other circulatory complications: Secondary | ICD-10-CM

## 2017-09-03 LAB — HEMOGLOBIN A1C
HEMOGLOBIN A1C: 6.7 % — AB (ref 4.8–5.6)
MEAN PLASMA GLUCOSE: 146 mg/dL

## 2017-09-03 LAB — GLUCOSE, CAPILLARY
Glucose-Capillary: 175 mg/dL — ABNORMAL HIGH (ref 65–99)
Glucose-Capillary: 147 mg/dL — ABNORMAL HIGH (ref 65–99)

## 2017-09-03 MED ORDER — CARVEDILOL 6.25 MG PO TABS: 6.2500 mg | ORAL_TABLET | Freq: Two times a day (BID) | ORAL | 0 refills | Status: DC

## 2017-09-03 MED ORDER — TICAGRELOR 90 MG PO TABS: 90.0000 mg | ORAL_TABLET | Freq: Two times a day (BID) | ORAL | 11 refills | Status: DC

## 2017-09-03 MED ORDER — ATORVASTATIN CALCIUM 80 MG PO TABS: 80.0000 mg | ORAL_TABLET | Freq: Every day | ORAL | 0 refills | Status: DC

## 2017-09-03 MED ORDER — NITROGLYCERIN 0.4 MG SL SUBL: 0.4000 mg | SUBLINGUAL_TABLET | SUBLINGUAL | 0 refills | Status: DC | PRN

## 2017-09-03 MED ORDER — ASPIRIN 81 MG PO CHEW: 81.0000 mg | CHEWABLE_TABLET | Freq: Every day | ORAL | Status: AC

## 2017-09-03 NOTE — Progress Notes (Addendum)
Progress Note  Patient Name: Melissa Rubio Date of Encounter: 09/03/2017 Primary Cardiologist: No primary care provider on file.   Subjective   Continues to feel well. No chest pain. Eating and drinking OK. Had some SOB with cardiac rehab yesterday.   Inpatient Medications    Scheduled Meds: . aspirin  81 mg Oral Daily  . atorvastatin  80 mg Oral q1800  . busPIRone  10 mg Oral TID  . carvedilol  6.25 mg Oral BID WC  . citalopram  40 mg Oral Daily  . enoxaparin (LOVENOX) injection  40 mg Subcutaneous Q24H  . excerise for heart and health book   Does not apply Once  . gabapentin  800 mg Oral QID  . insulin aspart  0-15 Units Subcutaneous TID WC  . insulin NPH Human  20 Units Subcutaneous BID AC & HS  . sodium chloride flush  3 mL Intravenous Q12H  . ticagrelor  90 mg Oral BID   Continuous Infusions: . sodium chloride     PRN Meds: sodium chloride, acetaminophen, ALPRAZolam, nicotine, nitroGLYCERIN, ondansetron (ZOFRAN) IV, sodium chloride flush   Vital Signs    Vitals:   09/02/17 1735 09/02/17 2035 09/03/17 0556 09/03/17 0906  BP: 113/68 103/65 102/63 117/66  Pulse: 70 96 95 76  Resp:   18   Temp: 99.4 F (37.4 C) 98.2 F (36.8 C) 98.7 F (37.1 C)   TempSrc: Oral Oral Oral   SpO2: 100% 97% 100%   Weight: 160 lb 6.4 oz (72.8 kg)  160 lb 11.2 oz (72.9 kg)   Height: 5\' 1"  (1.549 m)       Intake/Output Summary (Last 24 hours) at 09/03/2017 0957 Last data filed at 09/03/2017 0819 Gross per 24 hour  Intake 603 ml  Output 1150 ml  Net -547 ml   Filed Weights   09/01/17 2019 09/02/17 1735 09/03/17 0556  Weight: 165 lb 12.6 oz (75.2 kg) 160 lb 6.4 oz (72.8 kg) 160 lb 11.2 oz (72.9 kg)    Telemetry    NSR.- Personally Reviewed  ECG    NSR. Q-waves and TWI in II, III, aVF. No acute ST changes. - Personally Reviewed  Physical Exam   GEN: Pleasant caucasian female sitting upright in bed. No acute distress.  Poor health literacy Neck: No JVD, supple.    Cardiac: RRR, no murmurs, rubs, or gallops.  Respiratory: Diminished throughout but no wheezes or crackles GI: Soft, nontender, non-distended. +bs MS: No edema; No deformity. Extremities warm and perfused. Neuro:  Nonfocal, moves extremities spontaneously.  Psych: Normal affect, responds to questions appropriately.   Labs    Chemistry Recent Labs  Lab 09/01/17 1553 09/01/17 1857 09/02/17 0755  NA 138 139 138  K 3.9 4.2 3.8  CL 110 110 108  CO2 15* 19* 21*  GLUCOSE 158* 158* 105*  BUN 13 11 12   CREATININE 0.61 0.60 0.60  CALCIUM 8.8* 9.0 9.1  PROT 6.9 7.0  --   ALBUMIN 3.4* 3.4*  --   AST 25 59*  --   ALT 15 18  --   ALKPHOS 74 73  --   BILITOT 0.4 0.2*  --   GFRNONAA >60 >60 >60  GFRAA >60 >60 >60  ANIONGAP 13 10 9      Hematology Recent Labs  Lab 09/01/17 1553 09/01/17 1857 09/02/17 0755  WBC 14.2* 12.3* 13.5*  RBC 4.66 4.59 4.26  HGB 13.9 13.7 12.6  HCT 41.3 42.1 39.0  MCV 88.6 91.7 91.5  MCH  29.8 29.8 29.6  MCHC 33.7 32.5 32.3  RDW 14.7 15.1 15.0  PLT 317 299 294    Cardiac Enzymes Recent Labs  Lab 09/01/17 1553 09/01/17 1857 09/02/17 0231 09/02/17 0755  TROPONINI 0.04* 4.31* 19.35* 15.51*   No results for input(s): TROPIPOC in the last 168 hours.   Results for AMBERLIN, UTKE (MRN [id number]) as of 09/02/2017 08:35  Ref. Range 09/01/2017 15:53 09/02/2017 02:31  Total CHOL/HDL Ratio Latest Units: RATIO 7.9 8.3  Cholesterol Latest Ref Range: 0 - 200 mg/dL 246 (H) 217 (H)  HDL Cholesterol Latest Ref Range: >40 mg/dL 31 (L) 26 (L)  LDL (calc) Latest Ref Range: 0 - 99 mg/dL 181 (H) 147 (H)  Triglycerides Latest Ref Range: <150 mg/dL 169 (H) 222 (H)  VLDL Latest Ref Range: 0 - 40 mg/dL 34 44 (H)    Radiology    No results found.  Cardiac Studies   LHC 09/01/17: Coronary Diagrams   Diagnostic Diagram       Post-Intervention Diagram       *Inferior wall motion abnormality with preserved EF.  TTE 09/02/17:  Study Conclusions - Left  ventricle: The cavity size was normal. Wall thickness was   normal. Systolic function was normal. The estimated ejection   fraction was in the range of 55% to 60% with hypokinesis   hypokinesis of the distal inferior, inferoseptal walls.  Patient Profile     47 y.o. female with IDDM, HTN, HLD and ongoing tobacco use who presented 09/01/17 with several day history of CP. On arrival to ED she had 10/10 CP which was minimally improved with SL nitro x2 and ASA 324mg  and was found to have inferior STEMI. She was emergently taken to cath lab which revealed 3-vessel obstructive CAD w/ 100% stenosis of distal RCA s/p DES, 50% stenosis distal left main, 65% ostial LAD and 80% ostial LCx. Chest pain has resolved and medications are being optimized.   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 She has additional stenosis as noted above and LCA disease not felt to be amenable to PCI. Currently being medically managed with future consideration for CABG following recovery from this inferior MI. TTE with preserved EF and hypokinesis of distal inferior and inferoseptal walls. She will need aggressive RF modification going forward.  -Medical management for residual LCA disease with future consideration for CABG following recovery  -Continue ASA, Brilinta x1 year minimum  -Continue high-intensity statin -Evening coreg dose held due to hypotension, consider decreasing dose to 3.125mg  BID -Home ACE-I currently held given soft BP, hopeful to resume soon -Working with cardiac rehab -Will arrange cardiology follow-up in [place].   DM2: A1c 6.7% this admission! On NPH Insulin 40 units BID and Metformin 2000mg  total daily at home although doing well on modified regimen here. Requiring minimal SSI.  -Continue current  management -Asked for nutrition consult, patient with lots of questions re: DM and HLD  HTN: On Lisinopril-HCTZ 20-12.5mg  at home, held on admit due to hypotension. -Resume ACE-I when BP can tolerate -Continue BB, adjust as needed for BP/hr  HLD: Lipid panel on admission shows total cholesterol 246, HDL 31 and LDL 181. Does not appear to have been on statin at home. Started on high-intensity statin during admission and  will need significant reduction.  -Continue Atorvastatin 80mg  -LDL goal at least <70  Anxiety/Depression: On Celexa 40mg  and Buspar 10mg  TID at home which have been continued here.   Tobacco use disorder: Counseled on cessation and has prn nicotine patches.   DISPOSITION: Home in the next 1-2 days. Patient needs CARDIOLOGIST in [place].   For questions or updates, please contact [place] Please consult [email or web address] for contact info under Cardiology/STEMI.   Signed, Bethany Molt, DO  09/03/2017, 9:57 AM    I have examined the patient and reviewed assessment and plan and discussed with patient.  Agree with above as stated.  Feeling well.  Did better with the most recent walk.  Plan is for DAPT for 3 months and then consideration of CABG for residual left main disease.  Larae Grooms

## 2017-09-03 NOTE — Discharge Summary (Addendum)
Discharge Summary    Patient ID: Melissa Rubio,  MRN: [id number], DOB/AGE: Nov 07, 1970 47 y.o.  Admit date: 09/01/2017 Discharge date: 09/03/2017  Primary Care Provider: Timmothy Euler Primary Cardiologist: [place] ([place])  Discharge Diagnoses    Principal Problem:   STEMI (ST elevation myocardial infarction) [facility]) Active Problems:   STEMI involving right coronary artery ([place])   Diabetes ([place])   Anxiety   Hypertension   Depression   Tobacco use   Allergies No Known Allergies  Diagnostic Studies/Procedures    Cath: 09/01/17  Conclusion     Dist RCA lesion is 100% stenosed.  Prox RCA to Mid RCA lesion is 25% stenosed.  Dist LM-2 lesion is 50% stenosed.  Dist LM to Ost LAD lesion is 65% stenosed.  Ost Cx to Prox Cx lesion is 80% stenosed.  Post intervention, there is a 0% residual stenosis.  A drug-eluting stent was successfully placed using a STENT SYNERGY DES 2.5X32.  The left ventricular systolic function is normal.  LV end diastolic pressure is mildly elevated.  The left ventricular ejection fraction is 55-65% by visual estimate.   1. 3 vessel obstructive CAD. 100% distal RCA is the culprit vessel. She also has 50% distal left main, 65% ostial LAD, and 80% ostial LCx 2. Inferior wall motion abnormality with preserved LV systolic function 3. Mildly elevated LVEDP 4. Successful stenting of the distal RCA with DES  Plan: DAPT for one year. Will need to review films with interventional colleagues to decide management of residual disease in LCA. For now will treat medically. It is not well suited for PCI. Will need to decide whether CABG is needed after she has healed from MI.    TTE: 09/02/17  Study Conclusions  - Left ventricle: The cavity size was normal. Wall thickness was   normal. Systolic function was normal. The estimated ejection   fraction was in the range of 55% to 60% with hypokinesis   hypokinesis of the distal inferior,  inferoseptal walls. _____________   History of Present Illness     Melissa Rubio is a 47 yo female with PMH of HTN, HL and tobacco use. She presented with ongoing chest pain for the past 4 days.  Constant since the morning of admission. Associated with shortness of breath.  No radiation. EMS was called and found to have a inferior STEMI and taken to Cath Lab. 10 out of 10 chest pain during presentation.  Patient has received aspirin 324 mg, sublingual nitroglycerin x 2 without minimally improved.  Hospital Course     Underwent cardiac cath noted above with DES placement to distal RCA with plans for DAPT with ASA and Brilinta. She had additional stenosis as noted above and LCA disease not felt to be amenable to PCI. Plan is for medical therapy for a few months then consideration of CABG given LM disease. Troponin peaked at 19.35. Total cholesterol 246, HDL 31 and LDL 181, therefore started on high dose statin therapy. Also started on BB therapy, with home ACEi held. Follow up echo showed normal EF with hypokinesis in the distal inferior and inferoseptal hypokinesis. Her Hgb A1c was 6.7 and continued on NPH 40units BID along with metformin to restart at discharge. She was counseled on the need for tobacco cessation and started nicotine patches. Worked with cardiac rehab without recurrence of chest pain. Follow up made in the [place] office. Paperwork filled out for patient assistance with her Brilinta prior to discharge. Also seen by CM in reference  to affordability with new medications at discharge.    Melissa Rubio was seen by Dr. Irish Lack and determined stable for discharge home. Follow up in the office has been arranged. Medications are listed below.   _____________  Discharge Vitals Blood pressure 117/66, pulse 76, temperature 98.7 F (37.1 C), temperature source Oral, resp. rate 18, height 5\' 1"  (1.549 m), weight 160 lb 11.2 oz (72.9 kg), SpO2 100 %.  Filed Weights   09/01/17 2019  09/02/17 1735 09/03/17 0556  Weight: 165 lb 12.6 oz (75.2 kg) 160 lb 6.4 oz (72.8 kg) 160 lb 11.2 oz (72.9 kg)    Labs & Radiologic Studies    CBC Recent Labs    09/01/17 1857 09/02/17 0755  WBC 12.3* 13.5*  NEUTROABS 9.4*  --   HGB 13.7 12.6  HCT 42.1 39.0  MCV 91.7 91.5  PLT 299 462   Basic Metabolic Panel Recent Labs    09/01/17 1857 09/02/17 0755  NA 139 138  K 4.2 3.8  CL 110 108  CO2 19* 21*  GLUCOSE 158* 105*  BUN 11 12  CREATININE 0.60 0.60  CALCIUM 9.0 9.1  MG 1.8  --    Liver Function Tests Recent Labs    09/01/17 1553 09/01/17 1857  AST 25 59*  ALT 15 18  ALKPHOS 74 73  BILITOT 0.4 0.2*  PROT 6.9 7.0  ALBUMIN 3.4* 3.4*   No results for input(s): LIPASE, AMYLASE in the last 72 hours. Cardiac Enzymes Recent Labs    09/01/17 1857 09/02/17 0231 09/02/17 0755  TROPONINI 4.31* 19.35* 15.51*   BNP Invalid input(s): POCBNP D-Dimer No results for input(s): DDIMER in the last 72 hours. Hemoglobin A1C Recent Labs    09/02/17 0755  HGBA1C 6.7*   Fasting Lipid Panel Recent Labs    09/02/17 0231  CHOL 217*  HDL 26*  LDLCALC 147*  TRIG 222*  CHOLHDL 8.3   Thyroid Function Tests No results for input(s): TSH, T4TOTAL, T3FREE, THYROIDAB in the last 72 hours.  Invalid input(s): FREET3 _____________  No results found. Disposition   Pt is being discharged home today in good condition.  Follow-up Plans & Appointments    Follow-up Information    Arnoldo Lenis, MD Follow up on 09/19/2017.   Specialty:  Cardiology Why:  at 10:40am for your follow up appt.  Contact information: [address] [place] [address] [phone]          Discharge Instructions    Call MD for:  redness, tenderness, or signs of infection (pain, swelling, redness, odor or green/yellow discharge around incision site)   Complete by:  As directed    Diet - low sodium heart healthy   Complete by:  As directed    Discharge instructions   Complete  by:  As directed    Radial Site Care Refer to this sheet in the next few weeks. These instructions provide you with information on caring for yourself after your procedure. Your caregiver may also give you more specific instructions. Your treatment has been planned according to current medical practices, but problems sometimes occur. Call your caregiver if you have any problems or questions after your procedure. HOME CARE INSTRUCTIONS You may shower the day after the procedure.Remove the bandage (dressing) and gently wash the site with plain soap and water.Gently pat the site dry.  Do not apply powder or lotion to the site.  Do not submerge the affected site in water for 3 to 5 days.  Inspect  the site at least twice daily.  Do not flex or bend the affected arm for 24 hours.  No lifting over 5 pounds (2.3 kg) for 5 days after your procedure.  Do not drive home if you are discharged the same day of the procedure. Have someone else drive you.  You may drive 24 hours after the procedure unless otherwise instructed by your caregiver.  What to expect: Any bruising will usually fade within 1 to 2 weeks.  Blood that collects in the tissue (hematoma) may be painful to the touch. It should usually decrease in size and tenderness within 1 to 2 weeks.  SEEK IMMEDIATE MEDICAL CARE IF: You have unusual pain at the radial site.  You have redness, warmth, swelling, or pain at the radial site.  You have drainage (other than a small amount of blood on the dressing).  You have chills.  You have a fever or persistent symptoms for more than 72 hours.  You have a fever and your symptoms suddenly get worse.  Your arm becomes pale, cool, tingly, or numb.  You have heavy bleeding from the site. Hold pressure on the site.   PLEASE DO NOT MISS ANY DOSES OF YOUR BRILINTA!!!!! Also keep a log of you blood pressures and bring back to your follow up appt. Please call the office with any questions.   Patients taking  blood thinners should generally stay away from medicines like ibuprofen, Advil, Motrin, naproxen, and Aleve due to risk of stomach bleeding. You may take Tylenol as directed or talk to your primary doctor about alternatives.   Increase activity slowly   Complete by:  As directed       Discharge Medications     Medication List    STOP taking these medications   ibuprofen 800 MG tablet Commonly known as:  ADVIL,MOTRIN   lisinopril-hydrochlorothiazide 20-12.5 MG tablet Commonly known as:  ZESTORETIC   meloxicam 7.5 MG tablet Commonly known as:  MOBIC     TAKE these medications   ALPRAZolam 0.5 MG tablet Commonly known as:  XANAX Take 1 tablet (0.5 mg total) by mouth 3 (three) times daily as needed for anxiety.   aspirin 81 MG chewable tablet Chew 1 tablet (81 mg total) by mouth daily. Start taking on:  09/04/2017   atorvastatin 80 MG tablet Commonly known as:  LIPITOR Take 1 tablet (80 mg total) by mouth daily at 6 PM.   busPIRone 10 MG tablet Commonly known as:  BUSPAR Take 1 tablet (10 mg total) by mouth 3 (three) times daily.   carvedilol 6.25 MG tablet Commonly known as:  COREG Take 1 tablet (6.25 mg total) by mouth 2 (two) times daily with a meal.   citalopram 40 MG tablet Commonly known as:  CELEXA Take 1 tablet (40 mg total) by mouth daily.   cyclobenzaprine 10 MG tablet Commonly known as:  FLEXERIL Take 1 tablet (10 mg total) by mouth 3 (three) times daily as needed for muscle spasms.   gabapentin 800 MG tablet Commonly known as:  NEURONTIN Take 1 tablet (800 mg total) by mouth 4 (four) times daily.   hydrOXYzine 50 MG tablet Commonly known as:  ATARAX/VISTARIL Take 1 tablet (50 mg total) by mouth every 8 (eight) hours as needed. What changed:  reasons to take this   insulin NPH Human 100 UNIT/ML injection Commonly known as:  HUMULIN N,NOVOLIN N Inject 0.4 mLs (40 Units total) 2 (two) times daily before a meal into the skin. What  changed:    how  much to take  when to take this  additional instructions   INSULIN SYRINGE .5CC/29G 29G X 1/2" 0.5 ML Misc 1 Syringe by Does not apply route 2 (two) times daily.   metFORMIN 1000 MG tablet Commonly known as:  GLUCOPHAGE Take 1 tablet (1,000 mg total) by mouth 2 (two) times daily with a meal.   nitroGLYCERIN 0.4 MG SL tablet Commonly known as:  NITROSTAT Place 1 tablet (0.4 mg total) under the tongue every 5 (five) minutes as needed.   omeprazole 20 MG capsule Commonly known as:  PRILOSEC Take 20 mg by mouth daily.   ticagrelor 90 MG Tabs tablet Commonly known as:  BRILINTA Take 1 tablet (90 mg total) by mouth 2 (two) times daily.        Aspirin prescribed at discharge?  Yes High Intensity Statin Prescribed? (Lipitor 40-80mg  or Crestor 20-40mg ): Yes Beta Blocker Prescribed? Yes For EF <40%, was ACEI/ARB Prescribed? No: Blood pressures soft ADP Receptor Inhibitor Prescribed? (i.e. Plavix etc.-Includes Medically Managed Patients): Yes For EF <40%, Aldosterone Inhibitor Prescribed? No: EF ok Was EF assessed during THIS hospitalization? Yes Was Cardiac Rehab II ordered? (Included Medically managed Patients): Yes   Outstanding Labs/Studies   FLP/LFTs in 6 weeks if tolerating statin.   Duration of Discharge Encounter   Greater than 30 minutes including physician time.  Signed, Reino Bellis NP-C 09/03/2017, 12:51 PM   I have examined the patient and reviewed assessment and plan and discussed with patient.  Agree with above as stated.  Feeling well.  Did better with the most recent walk.  Plan is for DAPT for 3 months and then consideration of CABG for residual left main disease.  OK to discharge.  She will need patient assistance for Brilinta.  Forms given.  She will fill out her portion and bring to her appointment.  Discount cards for first month given to the patient.     Larae Grooms

## 2017-09-03 NOTE — Progress Notes (Signed)
Pt has been walking, feeling well today. Discussed walking at home, NTG, and OHS. Voiced understanding. Gave her OHS booklet, careguide, and video to watch. She was receptive. I will not refer to CRPII since she needs more revascularization. [place] CES, ACSM 1:32 PM 09/03/2017

## 2017-09-03 NOTE — Care Management Note (Signed)
Case Management Note  Patient Details  Name: Melissa Rubio MRN: [id number] Date of Birth: 1970-09-07  Subjective/Objective: Pt presented for [facility]- s/p cath with DES.    PTA Independent from home. Pt has support of family. Plan for d/c home today.  Action/Plan: Brilinta Card given by previous CM. NP has signed the patient assistance application and pt to get her part filled out and faxed to company. This CM did ask pt to call the Support Number on the Card to get enrolled into the [employer] Program. CM did call [facility] and priced medications. Cost came up to $51.00 not including ASA. Pt states that the cost is affordable. Per pt her sisiter will provide transport home. No further needs from CM at this time.              Expected Discharge Date:  09/03/17               Expected Discharge Plan:  Home/Self Care  In-House Referral:  NA  Discharge planning Services  CM Consult, Medication Assistance  Post Acute Care Choice:    Choice offered to:  NA  DME Arranged:  N/A DME Agency:  NA  HH Arranged:  NA HH Agency:  NA  Status of Service:  Completed, signed off  If discussed at [place] of Stay Meetings, dates discussed:    Additional Comments:  Bethena Roys, RN 09/03/2017, 11:53 AM

## 2017-09-03 NOTE — Discharge Instructions (Signed)
Radial Site Care Refer to this sheet in the next few weeks. These instructions provide you with information about caring for yourself after your procedure. Your health care provider may also give you more specific instructions. Your treatment has been planned according to current medical practices, but problems sometimes occur. Call your health care provider if you have any problems or questions after your procedure. What can I expect after the procedure? After your procedure, it is typical to have the following:  Bruising at the radial site that usually fades within 1-2 weeks.  Blood collecting in the tissue (hematoma) that may be painful to the touch. It should usually decrease in size and tenderness within 1-2 weeks.  Follow these instructions at home:  Take medicines only as directed by your health care provider.  You may shower 24-48 hours after the procedure or as directed by your health care provider. Remove the bandage (dressing) and gently wash the site with plain soap and water. Pat the area dry with a clean towel. Do not rub the site, because this may cause bleeding.  Do not take baths, swim, or use a hot tub until your health care provider approves.  Check your insertion site every day for redness, swelling, or drainage.  Do not apply powder or lotion to the site.  Do not flex or bend the affected arm for 24 hours or as directed by your health care provider.  Do not push or pull heavy objects with the affected arm for 24 hours or as directed by your health care provider.  Do not lift over 10 lb (4.5 kg) for 5 days after your procedure or as directed by your health care provider.  Ask your health care provider when it is okay to: ? Return to work or school. ? Resume usual physical activities or sports. ? Resume sexual activity.  Do not drive home if you are discharged the same day as the procedure. Have someone else drive you.  You may drive 24 hours after the procedure  unless otherwise instructed by your health care provider.  Do not operate machinery or power tools for 24 hours after the procedure.  If your procedure was done as an outpatient procedure, which means that you went home the same day as your procedure, a responsible adult should be with you for the first 24 hours after you arrive home.  Keep all follow-up visits as directed by your health care provider. This is important. Contact a health care provider if:  You have a fever.  You have chills.  You have increased bleeding from the radial site. Hold pressure on the site. Get help right away if:  You have unusual pain at the radial site.  You have redness, warmth, or swelling at the radial site.  You have drainage (other than a small amount of blood on the dressing) from the radial site.  The radial site is bleeding, and the bleeding does not stop after 30 minutes of holding steady pressure on the site.  Your arm or hand becomes pale, cool, tingly, or numb. This information is not intended to replace advice given to you by your health care provider. Make sure you discuss any questions you have with your health care provider. Document Released: 07/28/2010 Document Revised: 12/01/2015 Document Reviewed: 01/11/2014 Elsevier Interactive Patient Education  2018 [employer].    Diabetes Mellitus and Nutrition When you have diabetes (diabetes mellitus), it is very important to have healthy eating habits because your blood sugar (  glucose) levels are greatly affected by what you eat and drink. Eating healthy foods in the appropriate amounts, at about the same times every day, can help you:  Control your blood glucose.  Lower your risk of heart disease.  Improve your blood pressure.  Reach or maintain a healthy weight.  Every person with diabetes is different, and each person has different needs for a meal plan. Your health care provider may recommend that you work with a diet and  nutrition specialist (dietitian) to make a meal plan that is best for you. Your meal plan may vary depending on factors such as:  The calories you need.  The medicines you take.  Your weight.  Your blood glucose, blood pressure, and cholesterol levels.  Your activity level.  Other health conditions you have, such as heart or kidney disease.  How do carbohydrates affect me? Carbohydrates affect your blood glucose level more than any other type of food. Eating carbohydrates naturally increases the amount of glucose in your blood. Carbohydrate counting is a method for keeping track of how many carbohydrates you eat. Counting carbohydrates is important to keep your blood glucose at a healthy level, especially if you use insulin or take certain oral diabetes medicines. It is important to know how many carbohydrates you can safely have in each meal. This is different for every person. Your dietitian can help you calculate how many carbohydrates you should have at each meal and for snack. Foods that contain carbohydrates include:  Bread, cereal, rice, pasta, and crackers.  Potatoes and corn.  Peas, beans, and lentils.  Milk and yogurt.  Fruit and juice.  Desserts, such as cakes, cookies, ice cream, and candy.  How does alcohol affect me? Alcohol can cause a sudden decrease in blood glucose (hypoglycemia), especially if you use insulin or take certain oral diabetes medicines. Hypoglycemia can be a life-threatening condition. Symptoms of hypoglycemia (sleepiness, dizziness, and confusion) are similar to symptoms of having too much alcohol. If your health care provider says that alcohol is safe for you, follow these guidelines:  Limit alcohol intake to no more than 1 drink per day for nonpregnant women and 2 drinks per day for men. One drink equals 12 oz of beer, 5 oz of wine, or 1 oz of hard liquor.  Do not drink on an empty stomach.  Keep yourself hydrated with water, diet soda, or  unsweetened iced tea.  Keep in mind that regular soda, juice, and other mixers may contain a lot of sugar and must be counted as carbohydrates.  What are tips for following this plan? Reading food labels  Start by checking the serving size on the label. The amount of calories, carbohydrates, fats, and other nutrients listed on the label are based on one serving of the food. Many foods contain more than one serving per package.  Check the total grams (g) of carbohydrates in one serving. You can calculate the number of servings of carbohydrates in one serving by dividing the total carbohydrates by 15. For example, if a food has 30 g of total carbohydrates, it would be equal to 2 servings of carbohydrates.  Check the number of grams (g) of saturated and trans fats in one serving. Choose foods that have low or no amount of these fats.  Check the number of milligrams (mg) of sodium in one serving. Most people should limit total sodium intake to less than 2,300 mg per day.  Always check the nutrition information of foods labeled as "  low-fat" or "nonfat". These foods may be higher in added sugar or refined carbohydrates and should be avoided.  Talk to your dietitian to identify your daily goals for nutrients listed on the label. Shopping  Avoid buying canned, premade, or processed foods. These foods tend to be high in fat, sodium, and added sugar.  Shop around the outside edge of the grocery store. This includes fresh fruits and vegetables, bulk grains, fresh meats, and fresh dairy. Cooking  Use low-heat cooking methods, such as baking, instead of high-heat cooking methods like deep frying.  Cook using healthy oils, such as olive, canola, or sunflower oil.  Avoid cooking with butter, cream, or high-fat meats. Meal planning  Eat meals and snacks regularly, preferably at the same times every day. Avoid going long periods of time without eating.  Eat foods high in fiber, such as fresh fruits,  vegetables, beans, and whole grains. Talk to your dietitian about how many servings of carbohydrates you can eat at each meal.  Eat 4-6 ounces of lean protein each day, such as lean meat, chicken, fish, eggs, or tofu. 1 ounce is equal to 1 ounce of meat, chicken, or fish, 1 egg, or 1/4 cup of tofu.  Eat some foods each day that contain healthy fats, such as avocado, nuts, seeds, and fish. Lifestyle   Check your blood glucose regularly.  Exercise at least 30 minutes 5 or more days each week, or as told by your health care provider.  Take medicines as told by your health care provider.  Do not use any products that contain nicotine or tobacco, such as cigarettes and e-cigarettes. If you need help quitting, ask your health care provider.  Work with a Social worker or diabetes educator to identify strategies to manage stress and any emotional and social challenges. What are some questions to ask my health care provider?  Do I need to meet with a diabetes educator?  Do I need to meet with a dietitian?  What number can I call if I have questions?  When are the best times to check my blood glucose? Where to find more information:  American Diabetes Association: diabetes.org/food-and-fitness/food  Academy of Nutrition and Dietetics: PokerClues.dk  [employer] of Diabetes and Digestive and Kidney Diseases (NIH): ContactWire.be Summary  A healthy meal plan will help you control your blood glucose and maintain a healthy lifestyle.  Working with a diet and nutrition specialist (dietitian) can help you make a meal plan that is best for you.  Keep in mind that carbohydrates and alcohol have immediate effects on your blood glucose levels. It is important to count carbohydrates and to use alcohol carefully. This information is not intended to replace advice given to  you by your health care provider. Make sure you discuss any questions you have with your health care provider. Document Released: 03/22/2005 Document Revised: 07/30/2016 Document Reviewed: 07/30/2016 Elsevier Interactive Patient Education  2018 [place] Heart-healthy meal planning includes:  Limiting unhealthy fats.  Increasing healthy fats.  Making other small dietary changes.  You may need to talk with your doctor or a diet specialist (dietitian) to create an eating plan that is right for you. What types of fat should I choose?  Choose healthy fats. These include olive oil and canola oil, flaxseeds, walnuts, almonds, and seeds.  Eat more omega-3 fats. These include salmon, mackerel, sardines, tuna, flaxseed oil, and ground flaxseeds. Try to eat fish at least twice each week.  Limit saturated fats. ?  Saturated fats are often found in animal products, such as meats, butter, and cream. ? Plant sources of saturated fats include palm oil, palm kernel oil, and coconut oil.  Avoid foods with partially hydrogenated oils in them. These include stick margarine, some tub margarines, cookies, crackers, and other baked goods. These contain trans fats. What general guidelines do I need to follow?  Check food labels carefully. Identify foods with trans fats or high amounts of saturated fat.  Fill one half of your plate with vegetables and green salads. Eat 4-5 servings of vegetables per day. A serving of vegetables is: ? 1 cup of raw leafy vegetables. ?  cup of raw or cooked cut-up vegetables. ?  cup of vegetable juice.  Fill one fourth of your plate with whole grains. Look for the word "whole" as the first word in the ingredient list.  Fill one fourth of your plate with lean protein foods.  Eat 4-5 servings of fruit per day. A serving of fruit is: ? One medium whole fruit. ?  cup of dried fruit. ?  cup of fresh, frozen, or canned fruit. ?  cup of  100% fruit juice.  Eat more foods that contain soluble fiber. These include apples, broccoli, carrots, beans, peas, and barley. Try to get 20-30 g of fiber per day.  Eat more home-cooked food. Eat less restaurant, buffet, and fast food.  Limit or avoid alcohol.  Limit foods high in starch and sugar.  Avoid fried foods.  Avoid frying your food. Try baking, boiling, grilling, or broiling it instead. You can also reduce fat by: ? Removing the skin from poultry. ? Removing all visible fats from meats. ? Skimming the fat off of stews, soups, and gravies before serving them. ? Steaming vegetables in water or broth.  Lose weight if you are overweight.  Eat 4-5 servings of nuts, legumes, and seeds per week: ? One serving of dried beans or legumes equals  cup after being cooked. ? One serving of nuts equals 1 ounces. ? One serving of seeds equals  ounce or one tablespoon.  You may need to keep track of how much salt or sodium you eat. This is especially true if you have high blood pressure. Talk with your doctor or dietitian to get more information. What foods can I eat? Grains Breads, including [place], white, pita, wheat, raisin, rye, oatmeal, and [place]. Tortillas that are neither fried nor made with lard or trans fat. Low-fat rolls, including hotdog and hamburger buns and English muffins. Biscuits. Muffins. Waffles. Pancakes. Light popcorn. Whole-grain cereals. Flatbread. Melba toast. Pretzels. Breadsticks. Rusks. Low-fat snacks. Low-fat crackers, including oyster, saltine, matzo, graham, animal, and rye. Rice and pasta, including brown rice and pastas that are made with whole wheat. Vegetables All vegetables. Fruits All fruits, but limit coconut. Meats and Other Protein Sources Lean, well-trimmed beef, veal, pork, and lamb. Chicken and [place] without skin. All fish and shellfish. Wild duck, rabbit, pheasant, and venison. Egg whites or low-cholesterol egg substitutes. Dried beans,  peas, lentils, and tofu. Seeds and most nuts. Dairy Low-fat or nonfat cheeses, including ricotta, string, and mozzarella. Skim or 1% milk that is liquid, powdered, or evaporated. Buttermilk that is made with low-fat milk. Nonfat or low-fat yogurt. Beverages Mineral water. Diet carbonated beverages. Sweets and Desserts Sherbets and fruit ices. Honey, jam, marmalade, jelly, and syrups. Meringues and gelatins. Pure sugar candy, such as hard candy, jelly beans, gumdrops, mints, marshmallows, and small amounts of dark chocolate. [employer] Eat  all sweets and desserts in moderation. Fats and Oils Nonhydrogenated (trans-free) margarines. Vegetable oils, including soybean, sesame, sunflower, olive, peanut, safflower, corn, canola, and cottonseed. Salad dressings or mayonnaise made with a vegetable oil. Limit added fats and oils that you use for cooking, baking, salads, and as spreads. Other Cocoa powder. Coffee and tea. All seasonings and condiments. The items listed above may not be a complete list of recommended foods or beverages. Contact your dietitian for more options. What foods are not recommended? Grains Breads that are made with saturated or trans fats, oils, or whole milk. Croissants. Butter rolls. Cheese breads. Sweet rolls. Donuts. Buttered popcorn. Chow mein noodles. High-fat crackers, such as cheese or butter crackers. Meats and Other Protein Sources Fatty meats, such as hotdogs, short ribs, sausage, spareribs, bacon, rib eye roast or steak, and mutton. High-fat deli meats, such as salami and bologna. Caviar. Domestic duck and goose. Organ meats, such as kidney, liver, sweetbreads, and heart. Dairy Cream, sour cream, cream cheese, and creamed cottage cheese. Whole-milk cheeses, including blue (bleu), Monte[place] Jack, [place], [place], American, [place], Swiss, cheddar, [place], and [place]. Whole or 2% milk that is liquid, evaporated, or condensed. Whole buttermilk. Cream sauce or high-fat  cheese sauce. Yogurt that is made from whole milk. Beverages Regular sodas and juice drinks with added sugar. Sweets and Desserts Frosting. Pudding. Cookies. Cakes other than angel food cake. Candy that has milk chocolate or white chocolate, hydrogenated fat, butter, coconut, or unknown ingredients. Buttered syrups. Full-fat ice cream or ice cream drinks. Fats and Oils Gravy that has suet, meat fat, or shortening. Cocoa butter, hydrogenated oils, palm oil, coconut oil, palm kernel oil. These can often be found in baked products, candy, fried foods, nondairy creamers, and whipped toppings. Solid fats and shortenings, including bacon fat, salt pork, lard, and butter. Nondairy cream substitutes, such as coffee creamers and sour cream substitutes. Salad dressings that are made of unknown oils, cheese, or sour cream. The items listed above may not be a complete list of foods and beverages to avoid. Contact your dietitian for more information. This information is not intended to replace advice given to you by your health care provider. Make sure you discuss any questions you have with your health care provider. Document Released: 12/25/2011 Document Revised: 12/01/2015 Document Reviewed: 12/17/2013 Elsevier Interactive Patient Education  2018 [employer].   Ticagrelor oral tablet What is this medicine? TICAGRELOR (TYE ka GREL or) helps to prevent blood clots. This medicine is used to prevent heart attack, stroke, or other vascular events in people who have had a recent heart attack or who have severe chest pain. This medicine may be used for other purposes; ask your health care provider or pharmacist if you have questions. COMMON BRAND NAME(S): BRILINTA What should I tell my health care provider before I take this medicine? They need to know if you have any of these conditions: -bleeding disorders -bleeding in the brain -having surgery -history of irregular heartbeat -history of stomach  bleeding -liver disease -an unusual or allergic reaction to ticagrelor, other medicines, foods, dyes, or preservatives -pregnant or trying to get pregnant -breast-feeding How should I use this medicine? Take this medicine by mouth with a glass of water. Follow the directions on the prescription label. You can take it with or without food. If it upsets your stomach, take it with food. Take your medicine at regular intervals. Do not take it more often than directed. Do not stop taking except on your doctor's advice. Talk to  you pediatrician regarding the use of this medicine in children. Special care may be needed. Overdosage: If you think you have taken too much of this medicine contact a poison control center or emergency room at once. NOTE: This medicine is only for you. Do not share this medicine with others. What if I miss a dose? If you miss a dose, take it as soon as you can. If it is almost time for your next dose, take only that dose. Do not take double or extra doses. What may interact with this medicine? -certain antibiotics like clarithromycin and telithromycin -certain medicines for fungal infections like itraconazole, ketoconazole, and voriconazole -certain medicines for HIV infection like atazanavir, indinavir, nelfinavir, ritonavir, and saquinavir -certain medicines for seizures like carbamazepine, phenobarbital, and phenytoin -certain medicines that treat or prevent blood clots like warfarin -dexamethasone -digoxin -lovastatin -nefazodone -rifampin -simvastatin This list may not describe all possible interactions. Give your health care provider a list of all the medicines, herbs, non-prescription drugs, or dietary supplements you use. Also tell them if you smoke, drink alcohol, or use illegal drugs. Some items may interact with your medicine. What should I watch for while using this medicine? Visit your doctor or health care professional for regular check ups. Do not stop  taking you medicine unless your doctor tells you to. Notify your doctor or health care professional and seek emergency treatment if you develop breathing problems; changes in vision; chest pain; severe, sudden headache; pain, swelling, warmth in the leg; trouble speaking; sudden numbness or weakness of the face, arm, or leg. These can be signs that your condition has gotten worse. If you are going to have surgery or dental work, tell your doctor or health care professional that you are taking this medicine. You should take aspirin every day with this medicine. Do not take more than 100 mg each day. Talk to your doctor if you have questions. What side effects may I notice from receiving this medicine? Side effects that you should report to your doctor or health care professional as soon as possible: -allergic reactions like skin rash, itching or hives, swelling of the face, lips, or tongue -breathing problems -fast or irregular heartbeat -feeling faint or light-headed, falls -signs and symptoms of bleeding such as bloody or black, tarry stools; red or dark-brown urine; spitting up blood or brown material that looks like coffee grounds; red spots on the skin; unusual bruising or bleeding from the eye, gums, or nose Side effects that usually do not require medical attention (report to your doctor or health care professional if they continue or are bothersome): -breast enlargement in both males and females -diarrhea -dizziness -headache -tiredness -upset stomach This list may not describe all possible side effects. Call your doctor for medical advice about side effects. You may report side effects to FDA at 1-800-FDA-1088. Where should I keep my medicine? Keep out of the reach of children. Store at room temperature of 59 to 86 degrees F (15 to 30 degrees C). Throw away any unused medicine after the expiration date. NOTE: This sheet is a summary. It may not cover all possible information. If you  have questions about this medicine, talk to your doctor, pharmacist, or health care provider.  2018 Elsevier/Gold Standard (2015-07-28 11:53:14)

## 2017-09-04 ENCOUNTER — Telehealth: Payer: Self-pay

## 2017-09-04 NOTE — Telephone Encounter (Signed)
-----   Message from Desma Paganini sent at 09/03/2017 11:08 AM EST ----- Scheduled for [facility] w/ [facility] on March 14  Thank you

## 2017-09-04 NOTE — Telephone Encounter (Signed)
Called pt., no answer. I left detailed message on pt's voicemail, with instructions to return call.

## 2017-09-05 ENCOUNTER — Telehealth: Payer: Self-pay

## 2017-09-05 NOTE — Telephone Encounter (Signed)
[id number]   Patient contacted regarding discharge from [facility] on 09/03/17.  Patient understands to follow up with provider [facility] on 09/19/17 at 1040 at [place]. Patient understands discharge instructions? yes Patient understands medications and regiment? yes Patient understands to bring all medications to this visit? yes  Pt will need to see Financial assistance rep as she has no insurance and is concerned about her bill

## 2017-09-05 NOTE — Telephone Encounter (Signed)
-----   Message from Desma Paganini sent at 09/03/2017 11:08 AM EST ----- Scheduled for [facility] w/ [facility] on March 14  Thank you

## 2017-09-19 ENCOUNTER — Ambulatory Visit (INDEPENDENT_AMBULATORY_CARE_PROVIDER_SITE_OTHER): Payer: Self-pay | Admitting: Cardiology

## 2017-09-19 ENCOUNTER — Encounter: Payer: Self-pay | Admitting: Cardiology

## 2017-09-19 VITALS — BP 104/78 | HR 80 | Ht 60.0 in | Wt 168.6 lb

## 2017-09-19 DIAGNOSIS — R002 Palpitations: Secondary | ICD-10-CM

## 2017-09-19 DIAGNOSIS — I2510 Atherosclerotic heart disease of native coronary artery without angina pectoris: Secondary | ICD-10-CM

## 2017-09-19 MED ORDER — CLOPIDOGREL BISULFATE 75 MG PO TABS: 75.0000 mg | ORAL_TABLET | Freq: Every day | ORAL | 3 refills | Status: DC

## 2017-09-19 MED ORDER — CARVEDILOL 3.125 MG PO TABS: 3.1250 mg | ORAL_TABLET | Freq: Two times a day (BID) | ORAL | 3 refills | Status: DC

## 2017-09-19 NOTE — Progress Notes (Signed)
Clinical Summary [name] is a 47 y.o.female seen today for hospital follow up, this is our first visit together. She is seen for the following medical problems.   1. 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 Full report as indicated below, received DES to RCA. LM 50%, LAD 65%, LCX 80% treated medically.  - 08/2017 echo LVEF 55-60%, inferior hypokinesis - medical therapy limited due to soft bp's during admission.   - no recent chest pain. Some SOB with activities and fatigue which is new after discharge    2. Left arm pain - pain starts in neck, radiates into elbow. Has not had any other chest pain symptoms. Right cath radial site is asymptomatic, has healed.   3. Palpitations - occasional palpitations, 2-3 times per day - last about 15 minutes. Most often come on with activity or stress. Can have some SOB associated - coffee x 3 pots daily.   4. Tobacco abuse - ongoing use     Past Medical History:  Diagnosis Date  . Anxiety   . Cancer ([place]) 1994   ovarian  . Coronary artery disease    2/19 PCI/DES x1 to dRCA with LM, LAD disease (planned for possible CABG in near future), normal EF  . Diabetes ([place])   . Hypertension   . Itching   . Myocardial infarction ([place])   . RLS (restless legs syndrome)   . Tobacco use      No Known Allergies   Current Outpatient Medications  Medication Sig Dispense Refill  . ALPRAZolam (XANAX) 0.5 MG tablet Take 1 tablet (0.5 mg total) by mouth 3 (three) times daily as needed for anxiety. 75 tablet 2  . aspirin 81 MG chewable tablet Chew 1 tablet (81 mg total) by mouth daily.    Marland Kitchen atorvastatin (LIPITOR) 80 MG tablet Take 1 tablet (80 mg total) by mouth daily at 6 PM. 30 tablet 0  . busPIRone (BUSPAR) 10 MG tablet Take 1 tablet (10 mg total) by mouth 3 (three) times daily. 90 tablet 1  . carvedilol (COREG) 6.25 MG tablet Take 1 tablet (6.25 mg total) by mouth 2 (two) times daily with a meal. 60 tablet 0  . citalopram (CELEXA) 40 MG tablet Take  1 tablet (40 mg total) by mouth daily. 30 tablet 3  . cyclobenzaprine (FLEXERIL) 10 MG tablet Take 1 tablet (10 mg total) by mouth 3 (three) times daily as needed for muscle spasms. 30 tablet 1  . gabapentin (NEURONTIN) 800 MG tablet Take 1 tablet (800 mg total) by mouth 4 (four) times daily. 120 tablet 5  . hydrOXYzine (ATARAX/VISTARIL) 50 MG tablet Take 1 tablet (50 mg total) by mouth every 8 (eight) hours as needed. (Patient taking differently: Take 50 mg by mouth every 8 (eight) hours as needed for anxiety or itching. ) 270 tablet 3  . insulin NPH Human (HUMULIN N,NOVOLIN N) 100 UNIT/ML injection Inject 0.4 mLs (40 Units total) 2 (two) times daily before a meal into the skin. (Patient taking differently: Inject 30-40 Units into the skin See admin instructions. 40 units in the morning and 30 units at night) 30 mL 3  . INSULIN SYRINGE .5CC/29G 29G X 1/2" 0.5 ML MISC 1 Syringe by Does not apply route 2 (two) times daily. 100 each 11  . metFORMIN (GLUCOPHAGE) 1000 MG tablet Take 1 tablet (1,000 mg total) by mouth 2 (two) times daily with a meal. 60 tablet 11  . nitroGLYCERIN (NITROSTAT) 0.4 MG SL tablet Place 1 tablet (  0.4 mg total) under the tongue every 5 (five) minutes as needed. 25 tablet 0  . omeprazole (PRILOSEC) 20 MG capsule Take 20 mg by mouth daily.    . ticagrelor (BRILINTA) 90 MG TABS tablet Take 1 tablet (90 mg total) by mouth 2 (two) times daily. 60 tablet 11   No current facility-administered medications for this visit.      Past Surgical History:  Procedure Laterality Date  . CARDIAC CATHETERIZATION    . CORONARY STENT INTERVENTION N/A 09/01/2017   Procedure: CORONARY STENT INTERVENTION;  Surgeon: Martinique, Peter M, MD;  Location: [facility];  Service: Cardiovascular;  Laterality: N/A;  . CORONARY/GRAFT ACUTE MI REVASCULARIZATION N/A 09/01/2017   Procedure: Coronary/Graft Acute MI Revascularization;  Surgeon: Martinique, Peter M, MD;  Location: [facility];  Service:  Cardiovascular;  Laterality: N/A;  . LEFT HEART CATH AND CORONARY ANGIOGRAPHY N/A 09/01/2017   Procedure: LEFT HEART CATH AND CORONARY ANGIOGRAPHY;  Surgeon: Martinique, Peter M, MD;  Location: [facility];  Service: Cardiovascular;  Laterality: N/A;  . PARTIAL HYSTERECTOMY  2009     No Known Allergies    No family history on file.   Social History [name] reports that she has been smoking.  She has been smoking about 0.50 packs per day. she has never used smokeless tobacco. [name] reports that she drinks alcohol.   Review of Systems CONSTITUTIONAL: No weight loss, fever, chills, weakness or fatigue.  HEENT: Eyes: No visual loss, blurred vision, double vision or yellow sclerae.No hearing loss, sneezing, congestion, runny nose or sore throat.  SKIN: No rash or itching.  CARDIOVASCULAR: per hpi RESPIRATORY: per hpi GASTROINTESTINAL: No anorexia, nausea, vomiting or diarrhea. No abdominal pain or blood.  GENITOURINARY: No burning on urination, no polyuria NEUROLOGICAL: No headache, dizziness, syncope, paralysis, ataxia, numbness or tingling in the extremities. No change in bowel or bladder control.  MUSCULOSKELETAL: No muscle, back pain, joint pain or stiffness.  LYMPHATICS: No enlarged nodes. No history of splenectomy.  PSYCHIATRIC: No history of depression or anxiety.  ENDOCRINOLOGIC: No reports of sweating, cold or heat intolerance. No polyuria or polydipsia.  Marland Kitchen   Physical Examination Vitals:   09/19/17 1042  BP: 104/78  Pulse: 80  SpO2: 98%   Vitals:   09/19/17 1042  Weight: 168 lb 9.6 oz (76.5 kg)  Height: 5' (1.524 m)    Gen: resting comfortably, no acute distress HEENT: no scleral icterus, pupils equal round and reactive, no palptable cervical adenopathy,  CV: RRR, no m/r/g, no jvd Resp: Clear to auscultation bilaterally GI: abdomen is soft, non-tender, non-distended, normal bowel sounds, no hepatosplenomegaly MSK: extremities are warm, no edema.    Skin: warm, no rash Neuro:  no focal deficits Psych: appropriate affect   Diagnostic Studies 08/2017 cath  Dist RCA lesion is 100% stenosed.  Prox RCA to Mid RCA lesion is 25% stenosed.  Dist LM-2 lesion is 50% stenosed.  Dist LM to Ost LAD lesion is 65% stenosed.  Ost Cx to Prox Cx lesion is 80% stenosed.  Post intervention, there is a 0% residual stenosis.  A drug-eluting stent was successfully placed using a STENT SYNERGY DES 2.5X32.  The left ventricular systolic function is normal.  LV end diastolic pressure is mildly elevated.  The left ventricular ejection fraction is 55-65% by visual estimate.   1. 3 vessel obstructive CAD. 100% distal RCA is the culprit vessel. She also has 50% distal left main, 65% ostial LAD, and 80% ostial  LCx 2. Inferior wall motion abnormality with preserved LV systolic function 3. Mildly elevated LVEDP 4. Successful stenting of the distal RCA with DES  Plan: DAPT for one year. Will need to review films with interventional colleagues to decide management of residual disease in LCA. For now will treat medically. It is not well suited for PCI. Will need to decide whether CABG is needed after she has healed from MI.     Assessment and Plan  1. CAD - recent inferior STEMI, s/p stenting. She has residual LM 50% and ostial LAD 65%, LCX 80%. Recs from interventional cardiology were to consider CT surgery evaluation once she has recovered from her MI - no recent chest pain. Recent SOB and fatigue that may be more consistent with medication side effects as brillinta and coreg were started during recent admission. We will change brillinta to plavix, lower coreg to 3.125mg  bid. May consider chagning to lopressor if ongoing symptoms - refer to CT surgery to evaluate her remaining disease.   2. Left arm pain - neck and left arm pain, worst with movement and positional. Noncardiac, consistent with cervical radiculopathy. Defer to pcp.   3.  Palpitations - high caffeine intake (at least 3 coffee pots daily), wean caffeine. Continue beta blocker - if persistent, could consider home monitor.     Arnoldo Lenis, M.D.

## 2017-09-19 NOTE — Patient Instructions (Signed)
Medication Instructions:  STOP BRILINTA   DECREASE COREG TO 3.125 MG TWO TIMES DAILY   START PLAVIX - ON THE FIRST DAY- TAKE 300 MG (4 tablets) after the first day take 75 MG DAILY   Labwork: NONE  Testing/Procedures: NONE  Follow-Up: Your physician recommends that you schedule a follow-up appointment in: 1 MONTH    Any Other Special Instructions Will Be Listed Below (If Applicable).     If you need a refill on your cardiac medications before your next appointment, please call your pharmacy.

## 2017-09-23 ENCOUNTER — Encounter: Payer: Self-pay | Admitting: Cardiology

## 2017-09-24 ENCOUNTER — Other Ambulatory Visit: Payer: Self-pay

## 2017-09-25 ENCOUNTER — Telehealth: Payer: Self-pay

## 2017-09-25 DIAGNOSIS — R0602 Shortness of breath: Secondary | ICD-10-CM

## 2017-09-25 NOTE — Telephone Encounter (Signed)
-----   Message from Arnoldo Lenis, MD sent at 09/23/2017  8:22 AM EDT ----- Can we refer Ms Hedy Camara to CT surgery Dr Roxy Manns. Let her know I know we had disucssed setting the referal after our f/u in 1 month but it may take some time to get her appt and I'd like to go ahead and get her scheduled w CT surgery. Carlyle Dolly MD

## 2017-09-25 NOTE — Telephone Encounter (Signed)
-----   Message from Arnoldo Lenis, MD sent at 09/23/2017  8:22 AM EDT ----- Can we refer Ms Melissa Rubio to CT surgery Dr Roxy Manns. Let her know I know we had disucssed setting the referal after our f/u in 1 month but it may take some time to get her appt and I'd like to go ahead and get her scheduled w CT surgery. Carlyle Dolly MD

## 2017-09-25 NOTE — Telephone Encounter (Addendum)
Called pt. No answer. Left message for pt to return call. Placed referral to CT surgery. Pt must contact [facility] herself. I will provide number.

## 2017-09-30 ENCOUNTER — Other Ambulatory Visit: Payer: Self-pay | Admitting: Cardiology

## 2017-10-21 ENCOUNTER — Institutional Professional Consult (permissible substitution) (INDEPENDENT_AMBULATORY_CARE_PROVIDER_SITE_OTHER): Payer: Self-pay | Admitting: Thoracic Surgery (Cardiothoracic Vascular Surgery)

## 2017-10-21 ENCOUNTER — Other Ambulatory Visit: Payer: Self-pay | Admitting: *Deleted

## 2017-10-21 ENCOUNTER — Other Ambulatory Visit: Payer: Self-pay

## 2017-10-21 ENCOUNTER — Encounter: Payer: Self-pay | Admitting: Thoracic Surgery (Cardiothoracic Vascular Surgery)

## 2017-10-21 VITALS — BP 108/64 | HR 87 | Resp 16 | Ht 60.0 in | Wt 162.0 lb

## 2017-10-21 DIAGNOSIS — Z955 Presence of coronary angioplasty implant and graft: Secondary | ICD-10-CM

## 2017-10-21 DIAGNOSIS — I2111 ST elevation (STEMI) myocardial infarction involving right coronary artery: Secondary | ICD-10-CM

## 2017-10-21 DIAGNOSIS — I2510 Atherosclerotic heart disease of native coronary artery without angina pectoris: Secondary | ICD-10-CM

## 2017-10-21 DIAGNOSIS — I25119 Atherosclerotic heart disease of native coronary artery with unspecified angina pectoris: Secondary | ICD-10-CM

## 2017-10-21 NOTE — Progress Notes (Signed)
[address]       [place] [address]             [phone]     CARDIOTHORACIC SURGERY CONSULTATION REPORT  Referring Provider is Branch, Alphonse Guild, MD PCP is Timmothy Euler, MD  Chief Complaint  Patient presents with  . Coronary Artery Disease    eval for CABG s/p STEMI 09/01/17.. ...CATH 09/01/17, ECHO 09/02/17.Marland KitchenMarland KitchenRCA DES PLACED AT TIME OF CATH    HPI:  Patient is a 47 year old moderately obese female with history of hypertension, type 2 diabetes mellitus, hyperlipidemia, long-standing tobacco abuse, and a strong family history of coronary artery disease who recently presented with an acute inferior wall ST segment elevation myocardial infarction, was treated with PCI and stenting of the right coronary artery, and has now been referred for elective surgical consultation to discuss treatment options for management of left main and severe three-vessel coronary artery disease.  The patient states that she was in her usual state of health until early February of this year when she began to experience episodes of substernal chest discomfort described as a burning pain which she initially attributed to indigestion.  Symptoms were frequent and not necessarily related to physical activity or meals.  On the morning of September 01, 2017 the patient developed similar symptoms of burning substernal chest pain associated with shortness of breath that persisted.  She presented to the emergency department where she was diagnosed with acute evolving inferior wall ST segment elevation myocardial infarction.  She was taken directly to the Cath Lab by Dr. Martinique where she was found to have 100% occlusion of the mid right coronary artery.  She was treated with PCI and stenting using a single drug-eluting stent in the mid right coronary artery.  She was also found to have 50% left main coronary artery stenosis with ostial stenosis of the left anterior descending coronary artery and  the left circumflex coronary artery.  Follow-up echocardiogram performed the next day revealed preserved left ventricular systolic function.  The patient recovered uneventfully and was recently seen in follow-up by Dr. Harl Bowie.  Brilinta was stopped and she was started on Plavix as an alternative because of cost.  She was referred for elective surgical consultation.  The patient is single but currently lives with her fianc in [place].  She is not currently working.  She has 2 adult children.  She has a long-standing history of tobacco abuse.  She states that since hospital discharge from her recent heart attack she has smoked only 1 cigarette.  She lives a fairly sedentary lifestyle.  Prior to her heart attack she states that she was limited by lower back pain and bilateral hip pain related to degenerative arthritis and lumbar disc disease.  Since hospital discharge she has not had any symptoms of substernal chest pain or chest tightness similar to that which occurred prior to her recent heart attack.  She states that she has poor energy and she gets short of breath with activity.  She denies any resting shortness of breath.  She has had some palpitations.  She reports occasional mild dizzy spells.  She denies lower extremity edema.  Past Medical History:  Diagnosis Date  . Anxiety   . Cancer ([place]) 1994   ovarian  . Coronary artery disease    2/19 PCI/DES x1 to dRCA with LM, LAD disease (planned for possible CABG in near future), normal EF  . Coronary artery disease  involving native coronary artery of native heart with angina pectoris ([place])   . Diabetes ([place])   . Hypertension   . Itching   . Myocardial infarction ([place])   . RLS (restless legs syndrome)   . Tobacco use     Past Surgical History:  Procedure Laterality Date  . CARDIAC CATHETERIZATION    . CORONARY STENT INTERVENTION N/A 09/01/2017   Procedure: CORONARY STENT INTERVENTION;  Surgeon: Martinique, Peter M, MD;  Location: [facility];  Service: Cardiovascular;  Laterality: N/A;  . CORONARY/GRAFT ACUTE MI REVASCULARIZATION N/A 09/01/2017   Procedure: Coronary/Graft Acute MI Revascularization;  Surgeon: Martinique, Peter M, MD;  Location: [facility];  Service: Cardiovascular;  Laterality: N/A;  . LEFT HEART CATH AND CORONARY ANGIOGRAPHY N/A 09/01/2017   Procedure: LEFT HEART CATH AND CORONARY ANGIOGRAPHY;  Surgeon: Martinique, Peter M, MD;  Location: [facility];  Service: Cardiovascular;  Laterality: N/A;  . PARTIAL HYSTERECTOMY  2009    Family History  Problem Relation Age of Onset  . Cancer Mother   . Heart attack Father   . Diabetes Sister   . Diabetes Brother   . Diabetes Sister   . Heart attack Sister     Social History   Socioeconomic History  . Marital status: Legally Separated    Spouse name: Not on file  . Number of children: Not on file  . Years of education: Not on file  . Highest education level: Not on file  Occupational History  . Not on file  Social Needs  . Financial resource strain: Not on file  . Food insecurity:    Worry: Not on file    Inability: Not on file  . Transportation needs:    Medical: Not on file    Non-medical: Not on file  Tobacco Use  . Smoking status: Former Smoker    Packs/day: 0.75    Years: 35.00    Pack years: 26.25    Types: Cigarettes    Start date: 09/01/1981    Last attempt to quit: 09/01/2017    Years since quitting: 0.1  . Smokeless tobacco: Never Used  Substance and Sexual Activity  . Alcohol use: No    Frequency: Never  . Drug use: Not on file  . Sexual activity: Not on file  Lifestyle  . Physical activity:    Days per week: Not on file    Minutes per session: Not on file  . Stress: Not on file  Relationships  . Social connections:    Talks on phone: Not on file    Gets together: Not on file    Attends religious service: Not on file    Active member of club or organization: Not on file    Attends meetings of clubs or  organizations: Not on file    Relationship status: Not on file  . Intimate partner violence:    Fear of current or ex partner: Not on file    Emotionally abused: Not on file    Physically abused: Not on file    Forced sexual activity: Not on file  Other Topics Concern  . Not on file  Social History Narrative  . Not on file    Current Outpatient Medications  Medication Sig Dispense Refill  . ALPRAZolam (XANAX) 0.5 MG tablet Take 1 tablet (0.5 mg total) by mouth 3 (three) times daily as needed for anxiety. 75 tablet 2  . aspirin 81 MG chewable tablet Chew 1 tablet (  81 mg total) by mouth daily.    Marland Kitchen atorvastatin (LIPITOR) 80 MG tablet TAKE 1 TABLET BY MOUTH ONCE DAILY AT  6PM 90 tablet 1  . busPIRone (BUSPAR) 10 MG tablet Take 1 tablet (10 mg total) by mouth 3 (three) times daily. 90 tablet 1  . carvedilol (COREG) 3.125 MG tablet Take 1 tablet (3.125 mg total) by mouth 2 (two) times daily with a meal. 180 tablet 3  . citalopram (CELEXA) 40 MG tablet Take 1 tablet (40 mg total) by mouth daily. 30 tablet 3  . clopidogrel (PLAVIX) 75 MG tablet Take 1 tablet (75 mg total) by mouth daily. On day 1 take 300 mg (4 tablets) , then take 75 mg daily. 94 tablet 3  . cyclobenzaprine (FLEXERIL) 10 MG tablet Take 1 tablet (10 mg total) by mouth 3 (three) times daily as needed for muscle spasms. 30 tablet 1  . gabapentin (NEURONTIN) 800 MG tablet Take 1 tablet (800 mg total) by mouth 4 (four) times daily. 120 tablet 5  . hydrOXYzine (ATARAX/VISTARIL) 50 MG tablet Take 1 tablet (50 mg total) by mouth every 8 (eight) hours as needed. (Patient taking differently: Take 50 mg by mouth every 8 (eight) hours as needed for anxiety or itching. ) 270 tablet 3  . insulin NPH Human (HUMULIN N,NOVOLIN N) 100 UNIT/ML injection Inject 0.4 mLs (40 Units total) 2 (two) times daily before a meal into the skin. (Patient taking differently: Inject 30-40 Units into the skin See admin instructions. 40 units in the morning and 30  units at night) 30 mL 3  . INSULIN SYRINGE .5CC/29G 29G X 1/2" 0.5 ML MISC 1 Syringe by Does not apply route 2 (two) times daily. 100 each 11  . metFORMIN (GLUCOPHAGE) 1000 MG tablet Take 1 tablet (1,000 mg total) by mouth 2 (two) times daily with a meal. 60 tablet 11  . omeprazole (PRILOSEC) 20 MG capsule Take 20 mg by mouth daily.    . nitroGLYCERIN (NITROSTAT) 0.4 MG SL tablet Place 1 tablet (0.4 mg total) under the tongue every 5 (five) minutes as needed. 25 tablet 0   No current facility-administered medications for this visit.     No Known Allergies    Review of Systems:   General:  normal appetite, decreased energy, + weight gain, no weight loss, no fever  Cardiac:  + chest pain with exertion, + chest pain at rest, +SOB with exertion, no resting SOB, no PND, no orthopnea, + palpitations, no arrhythmia, no atrial fibrillation, no LE edema, + dizzy spells, no syncope  Respiratory:  + shortness of breath, no home oxygen, no productive cough, intermittent dry cough, no bronchitis, no wheezing, no hemoptysis, no asthma, no pain with inspiration or cough, no sleep apnea, no CPAP at night  GI:   no difficulty swallowing, no reflux, no frequent heartburn, no hiatal hernia, no abdominal pain, no constipation, no diarrhea, no hematochezia, no hematemesis, no melena  GU:   no dysuria,  no frequency, no urinary tract infection, no hematuria, no kidney stones, no kidney disease  Vascular:  no pain suggestive of claudication, + pain in feet, no leg cramps, no varicose veins, no DVT, no non-healing foot ulcer  Neuro:   no stroke, no TIA's, no seizures, + headaches, no temporary blindness one eye,  no slurred speech, + peripheral neuropathy, + chronic pain, + instability of gait, no memory/cognitive dysfunction  Musculoskeletal: + arthritis, no joint swelling, no myalgias, + difficulty walking, limited mobility   Skin:  no rash, no itching, no skin infections, no pressure sores or  ulcerations  Psych:   + anxiety, + depression, no nervousness, no unusual recent stress  Eyes:   no blurry vision, no floaters, no recent vision changes, + wears glasses or contacts  ENT:   no hearing loss, no loose or painful teeth, + dentures, last saw dentist 2017  Hematologic:  no easy bruising, no abnormal bleeding, no clotting disorder, no frequent epistaxis  Endocrine:  + diabetes, does ncheck CBG's at home     Physical Exam:   BP 108/64 (BP Location: Left Arm, Patient Position: Sitting, Cuff Size: Large)   Pulse 87   Resp 16   Ht 5' (1.524 m)   Wt 162 lb (73.5 kg)   SpO2 97% Comment: ON RA  BMI 31.64 kg/m   General:  Mildly obese,  well-appearing  HEENT:  Unremarkable   Neck:   no JVD, no bruits, no adenopathy   Chest:   clear to auscultation, symmetrical breath sounds, no wheezes, no rhonchi   CV:   RRR, no  murmur   Abdomen:  soft, non-tender, no masses   Extremities:  warm, well-perfused, pulses diminished but palpable, no LE edema  Rectal/GU  Deferred  Neuro:   Grossly non-focal and symmetrical throughout  Skin:   Clean and dry, no rashes, no breakdown   Diagnostic Tests:  Coronary/Graft Acute MI Revascularization  CORONARY STENT INTERVENTION  LEFT HEART CATH AND CORONARY ANGIOGRAPHY  Conclusion     Dist RCA lesion is 100% stenosed.  Prox RCA to Mid RCA lesion is 25% stenosed.  Dist LM-2 lesion is 50% stenosed.  Dist LM to Ost LAD lesion is 65% stenosed.  Ost Cx to Prox Cx lesion is 80% stenosed.  Post intervention, there is a 0% residual stenosis.  A drug-eluting stent was successfully placed using a STENT SYNERGY DES 2.5X32.  The left ventricular systolic function is normal.  LV end diastolic pressure is mildly elevated.  The left ventricular ejection fraction is 55-65% by visual estimate.   1. 3 vessel obstructive CAD. 100% distal RCA is the culprit vessel. She also has 50% distal left main, 65% ostial LAD, and 80% ostial LCx 2. Inferior  wall motion abnormality with preserved LV systolic function 3. Mildly elevated LVEDP 4. Successful stenting of the distal RCA with DES  Plan: DAPT for one year. Will need to review films with interventional colleagues to decide management of residual disease in LCA. For now will treat medically. It is not well suited for PCI. Will need to decide whether CABG is needed after she has healed from MI.      Indications   ST elevation myocardial infarction involving right coronary artery (HCC) [I21.11 (ICD-10-CM)]  Procedural Details/Technique   Technical Details Indication: 47 yo WF with history of DM presents with an inferior STEMI   Procedural Details: The right wrist was prepped, draped, and anesthetized with 1% lidocaine. Using the modified Seldinger technique and US guidance, a 6 French slender sheath was introduced into the right radial artery. 3 mg of verapamil was administered through the sheath, weight-based unfractionated heparin was administered intravenously. A JR4 catheter was used to image the RCA. A LA1 guide was used for PCI and to image the LCA. A Pigtail catheter was used for left ventriculography. Catheter exchanges were performed over an exchange length guidewire.    PCI Note: Following the diagnostic procedure, the decision was made to proceed with PCI. Weight-based heparin was given for anticoagulation.  Brilinta 180 mg was given orally. Once a therapeutic ACT was achieved, a 6 [place] LA1 guide catheter was inserted. A Choice PT MS coronary guidewire was used to cross the lesion. The lesion was predilated with a 2.0 mm balloon. The lesion was then stented with a 2.5 x 32 mm Synergy stent. The stent was postdilated with a 2.75 mm noncompliant balloon to 18 atm. With reperfusion the patient developed sustained AIVR and hypotension. She was given IV Dopamine for support but by the end of the case her rhythm and BP were stable and Dopamine was discontinued. Following PCI, there  was 0% residual stenosis and TIMI-3 flow. Final angiography confirmed an excellent result. The patient tolerated the procedure well. There were no immediate procedural complications. A TR band was used for radial hemostasis. The patient was transferred to the post catheterization recovery area for further monitoring. Contrast: 150 cc   Estimated blood loss <50 mL.  During this procedure the patient was administered the following to achieve and maintain moderate conscious sedation: Versed 2 mg, Fentanyl mcg, while the patient's heart rate, blood pressure, and oxygen saturation were continuously monitored. The period of conscious sedation was 32 minutes, of which I was present face-to-face 100% of this time.  Complications   Complications documented before study signed (09/01/2017 5:03 PM EST)    No complications were associated with this study.  Documented by Martinique, Peter M, MD - 09/01/2017 4:56 PM EST    Coronary Findings   Diagnostic  Dominance: Right  Left Main  Dist LM-2 lesion 50% stenosed  Dist LM-2 lesion is 50% stenosed.  Dist LM to Ost LAD lesion 65% stenosed  Dist LM to Ost LAD lesion is 65% stenosed.  Left Circumflex  Ost Cx to Prox Cx lesion 80% stenosed  Ost Cx to Prox Cx lesion is 80% stenosed.  Right Coronary Artery  Prox RCA to Mid RCA lesion 25% stenosed  Prox RCA to Mid RCA lesion is 25% stenosed.  Dist RCA lesion 100% stenosed  Dist RCA lesion is 100% stenosed.  Intervention   Dist RCA lesion  Stent  Lesion crossed with guidewire using a WIRE PT2 MS 185. Pre-stent angioplasty was performed using a BALLOON SAPPHIRE 2.0X12. A drug-eluting stent was successfully placed using a STENT SYNERGY DES 2.5X32. Stent strut is well apposed. Post-stent angioplasty was performed using a BALLOON SAPPHIRE [place] [id number]. Maximum pressure: 18 atm.  Post-Intervention Lesion Assessment  The intervention was successful. Pre-interventional TIMI flow is 0. Post-intervention TIMI flow is  3. No complications occurred at this lesion.  There is no residual stenosis post intervention.  Wall Motion              Left Heart   Left Ventricle The left ventricular size is normal. The left ventricular systolic function is normal. LV end diastolic pressure is mildly elevated. The left ventricular ejection fraction is 55-65% by visual estimate. There are LV function abnormalities due to segmental dysfunction.  Coronary Diagrams   Diagnostic Diagram       Post-Intervention Diagram         Transthoracic Echocardiography  Patient:    Melissa Rubio, Melissa Rubio MR #:       [id number] Study Date: 09/02/2017 Gender:     F Age:        74 Height:     154.9 cm Weight:     75.2 kg BSA:        1.83 m^2 Pt. Status: Room:       [facility]  ADMITTING    Peter Martinique, M.D.  ATTENDING    Peter Martinique, M.D.  ORDERING     Peter Martinique, M.D.  REFERRING    Peter Martinique, M.D.  PERFORMING   [facility], Inpatient  SONOGRAPHER  Jannett Celestine, RDCS  cc:  ------------------------------------------------------------------- LV EF: 55% -   60%  ------------------------------------------------------------------- History:   PMH:  Inferior STEMI. CAD Native Vessel 414.01.  Risk factors:  Current tobacco use. Hypertension. Diabetes mellitus.  ------------------------------------------------------------------- Study Conclusions  - Left ventricle: The cavity size was normal. Wall thickness was   normal. Systolic function was normal. The estimated ejection   fraction was in the range of 55% to 60% with hypokinesis   hypokinesis of the distal inferior, inferoseptal walls.  ------------------------------------------------------------------- Study data:  No prior study was available for comparison.  Study status:  Routine.  Procedure:  The patient reported no pain pre or post test. Transthoracic echocardiography. Image quality was adequate.  Study completion:  There were no  complications. Transthoracic echocardiography.  M-mode, complete 2D, spectral Doppler, and color Doppler.  Birthdate:  Patient birthdate: 08-06-1970.  Age:  Patient is 47 yr old.  Sex:  Gender: female. BMI: 31.3 kg/m^2.  Blood pressure:     96/76  Patient status: Inpatient.  Study date:  Study date: 09/02/2017. Study time: 08:06 AM.  Location:  ICU/CCU  -------------------------------------------------------------------  ------------------------------------------------------------------- Left ventricle:  The cavity size was normal. Wall thickness was normal. Systolic function was normal. The estimated ejection fraction was in the range of 55% to 60% with hypokinesis hypokinesis of the distal inferior, inferoseptal walls.  ------------------------------------------------------------------- Aortic valve:   Structurally normal valve.   Cusp separation was normal.  Doppler:  Transvalvular velocity was within the normal range. There was no stenosis. There was no regurgitation.  ------------------------------------------------------------------- Mitral valve:   Structurally normal valve.   Leaflet separation was normal.  Doppler:  Transvalvular velocity was within the normal range. There was no evidence for stenosis. There was no regurgitation.    Peak gradient (D): 5 mm Hg.  ------------------------------------------------------------------- Left atrium:  The atrium was normal in size.  ------------------------------------------------------------------- Right ventricle:  The cavity size was normal. Wall thickness was normal. Systolic function was normal.  ------------------------------------------------------------------- Pulmonic valve:   Poorly visualized.  Doppler:  There was trivial regurgitation.  ------------------------------------------------------------------- Tricuspid valve:   Structurally normal valve.   Leaflet separation was normal.  Doppler:  Transvalvular  velocity was within the normal range. There was no regurgitation.  ------------------------------------------------------------------- Right atrium:  The atrium was normal in size.  ------------------------------------------------------------------- Pericardium:  There was no pericardial effusion.  ------------------------------------------------------------------- Systemic veins: Inferior vena cava: The vessel was normal in size. The respirophasic diameter changes were in the normal range (>= 50%), consistent with normal central venous pressure.  ------------------------------------------------------------------- Measurements   Left ventricle                            Value        Reference  LV ID, ED, PLAX chordal                   44.3  mm     43 - 52  LV ID, ES, PLAX chordal                   32.2  mm     23 - 38  LV fx shortening, PLAX chordal  (L)       27    %      >=  29  LV PW thickness, ED                       11.4  mm     ----------  IVS/LV PW ratio, ED                       0.96         <=1.3  Stroke volume, 2D                         39    ml     ----------  Stroke volume/bsa, 2D                     21    ml/m^2 ----------  LV e&', lateral                            6.85  cm/s   ----------  LV E/e&', lateral                          16.06        ----------  LV e&', medial                             6.42  cm/s   ----------  LV E/e&', medial                           17.13        ----------  LV e&', average                            6.64  cm/s   ----------  LV E/e&', average                          16.58        ----------    Ventricular septum                        Value        Reference  IVS thickness, ED                         11    mm     ----------    LVOT                                      Value        Reference  LVOT ID, S                                19    mm     ----------  LVOT area                                 2.84  cm^2   ----------  LVOT  peak velocity, S  75.5  cm/s   ----------  LVOT mean velocity, S                     45.1  cm/s   ----------  LVOT VTI, S                               13.6  cm     ----------    Aorta                                     Value        Reference  Aortic root ID, ED                        28    mm     ----------    Left atrium                               Value        Reference  LA ID, A-P, ES                            36    mm     ----------  LA ID/bsa, A-P                            1.97  cm/m^2 <=2.2  LA volume, S                              28.6  ml     ----------  LA volume/bsa, S                          15.6  ml/m^2 ----------  LA volume, ES, 1-p A4C                    31.5  ml     ----------  LA volume/bsa, ES, 1-p A4C                17.2  ml/m^2 ----------  LA volume, ES, 1-p A2C                    24.1  ml     ----------  LA volume/bsa, ES, 1-p A2C                13.2  ml/m^2 ----------    Mitral valve                              Value        Reference  Mitral E-wave peak velocity               110   cm/s   ----------  Mitral A-wave peak velocity               74.1  cm/s   ----------  Mitral deceleration time                  190   ms  150 - 230  Mitral peak gradient, D                   5     mm Hg  ----------  Mitral E/A ratio, peak                    1.5          ----------    Right atrium                              Value        Reference  RA ID, S-I, ES, A4C                       40.9  mm     34 - 49  RA area, ES, A4C                          8.79  cm^2   8.3 - 19.5  RA volume, ES, A/L                        14.5  ml     ----------  RA volume/bsa, ES, A/L                    7.9   ml/m^2 ----------    Right ventricle                           Value        Reference  RV ID, minor axis, ED, A4C base           33.8  mm     ----------  RV ID, minor axis, ED, A4C mid            31.2  mm     ----------  RV ID, major axis, ED, A4C                64.8   mm     55 - 91  TAPSE                                     15.1  mm     ----------  RV s&', lateral, S                         9.68  cm/s   ----------  Legend: (L)  and  (H)  mark values outside specified reference range.  ------------------------------------------------------------------- Prepared and Electronically Authenticated by  Dorris Carnes, M.D. 2019-02-25T13:48:41    Impression:  Patient has left main and three-vessel coronary artery disease.  She recently presented with an acute inferior wall ST segment elevation myocardial infarction that was successfully treated using PCI and stenting of the mid right coronary artery using a drug eluting stent.  She had an angiographically good result following PCI and she has remained clinically stable with no recurrent symptoms of chest pain or chest tightness.  She does still get short of breath with moderate and low-level activity.  I have personally reviewed the patient's diagnostic cardiac catheterization and transthoracic echocardiogram.  She has significant greater than 50% stenosis of the distal left main coronary artery  with ostial stenosis of both the left anterior descending coronary artery and the left circumflex coronary artery.  I agree she would likely benefit from surgical revascularization.   Plan:  I have reviewed the indications, risks, and potential benefits of coronary artery bypass grafting with the patient and her aunt.  Her fianc listened in to the conversation over telephone.  Alternative treatment strategies have been discussed, including the relative risks, benefits and long term prognosis associated with medical therapy, percutaneous coronary intervention, and surgical revascularization.  The importance of long-term risk modification have been emphasized.  All of her questions have been addressed.  Because the patient remains clinically stable at this time, it seems reasonable to wait until 3 months following her  stent procedure before proceeding with elective surgery so that we can interrupt dual antiplatelet therapy with relatively low associated risk of stent thrombosis.  We tentatively plan to proceed with surgery the first week of June.  The patient will return to our office for follow-up prior to surgery on Nov 25, 2017.  The importance that she find a way to stop smoking permanently has been discussed at length.  We have not recommended any changes to her current medications.  The patient will contact Dr. Harl Bowie and will go directly to the emergency room if she develops recurrent symptoms of chest pain or chest tightness unrelieved by administration of nitroglycerin.  All of her questions have been addressed.    I spent in excess of 90 minutes during the conduct of this office consultation and >50% of this time involved direct face-to-face encounter with the patient for counseling and/or coordination of their care.   Valentina Gu. Roxy Manns, MD 10/21/2017 12:09 PM

## 2017-10-21 NOTE — Patient Instructions (Signed)
Continue all previous medications without any changes at this time  Avoid strenuous activity  Contact your cardiologist or go directly to Emergency if you develop chest pain unrelieved by nitroglycerine  Stop smoking immediately and permanently.

## 2017-10-22 ENCOUNTER — Encounter: Payer: Self-pay | Admitting: Family Medicine

## 2017-10-22 ENCOUNTER — Ambulatory Visit (INDEPENDENT_AMBULATORY_CARE_PROVIDER_SITE_OTHER): Payer: MEDICAID | Admitting: Family Medicine

## 2017-10-22 VITALS — BP 117/76 | HR 92 | Temp 98.6°F | Ht 60.0 in | Wt 158.0 lb

## 2017-10-22 DIAGNOSIS — F419 Anxiety disorder, unspecified: Secondary | ICD-10-CM

## 2017-10-22 DIAGNOSIS — E1165 Type 2 diabetes mellitus with hyperglycemia: Secondary | ICD-10-CM

## 2017-10-22 MED ORDER — GABAPENTIN 800 MG PO TABS: 800.0000 mg | ORAL_TABLET | Freq: Four times a day (QID) | ORAL | 5 refills | Status: DC

## 2017-10-22 MED ORDER — ALPRAZOLAM 0.5 MG PO TABS: 0.5000 mg | ORAL_TABLET | Freq: Three times a day (TID) | ORAL | 2 refills | Status: DC | PRN

## 2017-10-22 NOTE — Progress Notes (Signed)
Cardiology Office Note    Date:  10/23/2017   ID:  Melissa Rubio, DOB October 12, 1970, MRN [id number]  PCP:  Timmothy Euler, MD  Cardiologist: Carlyle Dolly, MD    Chief Complaint  Patient presents with  . Follow-up    1 month visit    History of Present Illness:    Melissa Rubio is a 47 y.o. female past medical history of CAD (s/p inferior STEMI in 08/2017 with DES to RCA and residual distal LM, ostial LAD, and LCx disease noted with possible CABG in the future), HTN, HLD, IDDM, and tobacco use who presents to the office today for 32-month follow-up.   She was last examined by Dr. Harl Bowie on 09/09/2017 and reported some dyspnea on exertion with activities but denied any recent chest discomfort.  It was thought that her episodes of shortness of breath were possibly due to Brilinta and she was switched to Plavix at the time of her visit.  Was continued on Carvedilol and statin therapy.  She was referred to CT surgery and evaluated by Dr. Roxy Manns on 10/21/2017. With her remaining clinically stable at that time, he recommended waiting approximately 3 months before proceeding with elective surgery as to not interrupt dual antiplatelet therapy in the setting of recent stent placement.  In talking with the patient today, she reports chronic fatigue since her recent myocardial infarction in 08/2017.  Notes associated dyspnea on exertion which occurs when walking for more than 100 feet. Says this has been consistent and denies any acute changes in her respiratory status. No recent episodes of chest discomfort. She does note occasional palpitations which occurs 5-6 times per day and can last for up to 5-minute intervals. Denies any associated lightheadedness, dizziness, or presyncope but feels like her "heart is going to beat out of her chest".  She reports good compliance with her medication regimen including ASA and Plavix and denies any evidence of active bleeding.   Past Medical History:    Diagnosis Date  . Anxiety   . Cancer [facility]) 1994   cervical  . Coronary artery disease    2/19 PCI/DES x1 to dRCA with LM, LAD disease (planned for possible CABG in near future), normal EF  . Coronary artery disease involving native coronary artery of native heart with angina pectoris ([place])   . Diabetes ([place])   . Hypertension   . Itching   . Myocardial infarction ([place])   . RLS (restless legs syndrome)   . Tobacco use     Past Surgical History:  Procedure Laterality Date  . CARDIAC CATHETERIZATION    . CORONARY STENT INTERVENTION N/A 09/01/2017   Procedure: CORONARY STENT INTERVENTION;  Surgeon: Martinique, Peter M, MD;  Location: [place] [facility];  Service: Cardiovascular;  Laterality: N/A;  . CORONARY/GRAFT ACUTE MI REVASCULARIZATION N/A 09/01/2017   Procedure: Coronary/Graft Acute MI Revascularization;  Surgeon: Martinique, Peter M, MD;  Location: [place] [facility];  Service: Cardiovascular;  Laterality: N/A;  . LEFT HEART CATH AND CORONARY ANGIOGRAPHY N/A 09/01/2017   Procedure: LEFT HEART CATH AND CORONARY ANGIOGRAPHY;  Surgeon: Martinique, Peter M, MD;  Location: [place] [facility];  Service: Cardiovascular;  Laterality: N/A;  . PARTIAL HYSTERECTOMY  2009    Current Medications: Outpatient Medications Prior to Visit  Medication Sig Dispense Refill  . ALPRAZolam (XANAX) 0.5 MG tablet Take 1 tablet (0.5 mg total) by mouth 3 (three) times daily as needed for anxiety. 75 tablet 2  . aspirin 81 MG chewable tablet Chew  1 tablet (81 mg total) by mouth daily.    Marland Kitchen atorvastatin (LIPITOR) 80 MG tablet TAKE 1 TABLET BY MOUTH ONCE DAILY AT  6PM 90 tablet 1  . busPIRone (BUSPAR) 10 MG tablet Take 1 tablet (10 mg total) by mouth 3 (three) times daily. 90 tablet 1  . carvedilol (COREG) 3.125 MG tablet Take 1 tablet (3.125 mg total) by mouth 2 (two) times daily with a meal. 180 tablet 3  . citalopram (CELEXA) 40 MG tablet Take 1 tablet (40 mg total) by mouth daily. 30 tablet 3  . clopidogrel (PLAVIX)  75 MG tablet Take 1 tablet (75 mg total) by mouth daily. On day 1 take 300 mg (4 tablets) , then take 75 mg daily. 94 tablet 3  . cyclobenzaprine (FLEXERIL) 10 MG tablet Take 1 tablet (10 mg total) by mouth 3 (three) times daily as needed for muscle spasms. 30 tablet 1  . gabapentin (NEURONTIN) 800 MG tablet Take 1 tablet (800 mg total) by mouth 4 (four) times daily. 120 tablet 5  . hydrOXYzine (ATARAX/VISTARIL) 50 MG tablet Take 1 tablet (50 mg total) by mouth every 8 (eight) hours as needed. (Patient taking differently: Take 50 mg by mouth every 8 (eight) hours as needed for anxiety or itching. ) 270 tablet 3  . insulin NPH Human (HUMULIN N,NOVOLIN N) 100 UNIT/ML injection Inject 0.4 mLs (40 Units total) 2 (two) times daily before a meal into the skin. (Patient taking differently: Inject 30-40 Units into the skin See admin instructions. 40 units in the morning and 30 units at night) 30 mL 3  . INSULIN SYRINGE .5CC/29G 29G X 1/2" 0.5 ML MISC 1 Syringe by Does not apply route 2 (two) times daily. 100 each 11  . metFORMIN (GLUCOPHAGE) 1000 MG tablet Take 1 tablet (1,000 mg total) by mouth 2 (two) times daily with a meal. 60 tablet 11  . nitroGLYCERIN (NITROSTAT) 0.4 MG SL tablet Place 1 tablet (0.4 mg total) under the tongue every 5 (five) minutes as needed. 25 tablet 0  . omeprazole (PRILOSEC) 20 MG capsule Take 20 mg by mouth daily.     No facility-administered medications prior to visit.      Allergies:   Patient has no known allergies.   Social History   Socioeconomic History  . Marital status: Legally Separated    Spouse name: Not on file  . Number of children: Not on file  . Years of education: Not on file  . Highest education level: Not on file  Occupational History  . Not on file  Social Needs  . Financial resource strain: Not on file  . Food insecurity:    Worry: Not on file    Inability: Not on file  . Transportation needs:    Medical: Not on file    Non-medical: Not on file   Tobacco Use  . Smoking status: Former Smoker    Packs/day: 0.75    Years: 35.00    Pack years: 26.25    Types: Cigarettes    Start date: 09/01/1981    Last attempt to quit: 09/01/2017    Years since quitting: 0.1  . Smokeless tobacco: Never Used  Substance and Sexual Activity  . Alcohol use: No    Frequency: Never  . Drug use: Never  . Sexual activity: Not on file  Lifestyle  . Physical activity:    Days per week: Not on file    Minutes per session: Not on file  . Stress: Not  on file  Relationships  . Social connections:    Talks on phone: Not on file    Gets together: Not on file    Attends religious service: Not on file    Active member of club or organization: Not on file    Attends meetings of clubs or organizations: Not on file    Relationship status: Not on file  Other Topics Concern  . Not on file  Social History Narrative  . Not on file     Family History:  The patient's family history includes Cancer in her mother; Diabetes in her brother, sister, and sister; Heart attack in her father and sister.   Review of Systems:   Please see the history of present illness.     General:  No chills, fever, night sweats or weight changes.  Cardiovascular:  No chest pain, edema, orthopnea, paroxysmal nocturnal dyspnea. Positive for palpitations and dyspnea on exertion.  Dermatological: No rash, lesions/masses Respiratory: No cough, dyspnea Urologic: No hematuria, dysuria Abdominal:   No nausea, vomiting, diarrhea, bright red blood per rectum, melena, or hematemesis Neurologic:  No visual changes, wkns, changes in mental status. All other systems reviewed and are otherwise negative except as noted above.   Physical Exam:    VS:  BP 108/76   Pulse 89   Ht 5' (1.524 m)   Wt 158 lb (71.7 kg)   SpO2 98%   BMI 30.86 kg/m    General: Well developed, well nourished Caucasian female appearing in no acute distress. Head: Normocephalic, atraumatic, sclera non-icteric, no  xanthomas, nares are without discharge.  Neck: No carotid bruits. JVD not elevated.  Lungs: Respirations regular and unlabored, without wheezes or rales.  Heart: Regular rate and rhythm. No S3 or S4.  No murmur, no rubs, or gallops appreciated. Abdomen: Soft, non-tender, non-distended with normoactive bowel sounds. No hepatomegaly. No rebound/guarding. No obvious abdominal masses. Msk:  Strength and tone appear normal for age. No joint deformities or effusions. Extremities: No clubbing or cyanosis. No lower extremity edema.  Distal pedal pulses are 2+ bilaterally. Neuro: Alert and oriented X 3. Moves all extremities spontaneously. No focal deficits noted. Psych:  Responds to questions appropriately with a normal affect. Skin: No rashes or lesions noted  Wt Readings from Last 3 Encounters:  10/23/17 158 lb (71.7 kg)  10/22/17 158 lb (71.7 kg)  10/21/17 162 lb (73.5 kg)     Studies/Labs Reviewed:   EKG:  EKG is not ordered today.   Recent Labs: 09/01/2017: ALT 18; Magnesium 1.8 09/02/2017: BUN 12; Creatinine, Ser 0.60; Hemoglobin 12.6; Platelets 294; Potassium 3.8; Sodium 138   Lipid Panel    Component Value Date/Time   CHOL 217 (H) 09/02/2017 0231   TRIG 222 (H) 09/02/2017 0231   HDL 26 (L) 09/02/2017 0231   CHOLHDL 8.3 09/02/2017 0231   VLDL 44 (H) 09/02/2017 0231   LDLCALC 147 (H) 09/02/2017 0231    Additional studies/ records that were reviewed today include:   Cardiac Catheterization: 09/01/2017  Dist RCA lesion is 100% stenosed.  Prox RCA to Mid RCA lesion is 25% stenosed.  Dist LM-2 lesion is 50% stenosed.  Dist LM to Ost LAD lesion is 65% stenosed.  Ost Cx to Prox Cx lesion is 80% stenosed.  Post intervention, there is a 0% residual stenosis.  A drug-eluting stent was successfully placed using a STENT SYNERGY DES 2.5X32.  The left ventricular systolic function is normal.  LV end diastolic pressure is mildly elevated.  The  left ventricular ejection  fraction is 55-65% by visual estimate.   1. 3 vessel obstructive CAD. 100% distal RCA is the culprit vessel. She also has 50% distal left main, 65% ostial LAD, and 80% ostial LCx 2. Inferior wall motion abnormality with preserved LV systolic function 3. Mildly elevated LVEDP 4. Successful stenting of the distal RCA with DES  Plan: DAPT for one year. Will need to review films with interventional colleagues to decide management of residual disease in LCA. For now will treat medically. It is not well suited for PCI. Will need to decide whether CABG is needed after she has healed from MI.   Echocardiogram: 09/02/2017 Study Conclusions  - Left ventricle: The cavity size was normal. Wall thickness was   normal. Systolic function was normal. The estimated ejection   fraction was in the range of 55% to 60% with hypokinesis   hypokinesis of the distal inferior, inferoseptal walls.  Assessment:    1. Coronary artery disease involving native coronary artery of native heart without angina pectoris   2. Palpitations   3. Hyperlipidemia LDL goal <70   4. IDDM (insulin dependent diabetes mellitus) ([place])   5. Medication management   6. Tobacco use      Plan:   In order of problems listed above:  1. CAD - s/p inferior STEMI in 08/2017 with DES to RCA and residual distal LM, ostial LAD, and LCx disease noted with possible CABG in the future. She has been evaluated by Dr. Roxy Manns and is scheduled for CABG on 12/12/2017. - she reports persistent fatigue and dyspnea since her initial MI but denies any recurrent chest pain.  - will continue on ASA, Plavix, BB, and statin therapy. Pending results of her Holter monitor, can further titrate Coreg to 6.25mg  BID or switch to Lopressor.   2. Palpitations - reports frequent palpitations occurring 5-6 times per day and lasting for up to 5-minute intervals. Denies any associated lightheadedness, dizziness, or presyncope. - will obtain a 48-hour Holter monitor  as this has been occurring on a daily basis. Will check BMET and Mg. Recommended reduction in caffeine intake as she was previously consuming a pot of coffee per day.   3. HLD - FLP in 08/2017 showed total cholesterol 217, triglycerides 222, HDL 26, and LDL 147. Was started on Atorvastatin 80mg  daily at that time.  - will obtain repeat FLP and LFT's.  4. IDDM - Hgb A1c at 6.7 in 08/2017. - Followed by PCP.  5. Tobacco Use - She continues to smoke but has reduced her use to less than 0.5 ppd. Complete cessation advised.   Medication Adjustments/Labs and Tests Ordered: Current medicines are reviewed at length with the patient today.  Concerns regarding medicines are outlined above.  Medication changes, Labs and Tests ordered today are listed in the Patient Instructions below. Patient Instructions  Medication Instructions:  Your physician recommends that you continue on your current medications as directed. Please refer to the Current Medication list given to you today.  Labwork: Your physician recommends that you return for lab work fasting.  Testing/Procedures: Your physician has recommended that you wear a holter monitor. Holter monitors are medical devices that record the heart's electrical activity. Doctors most often use these monitors to diagnose arrhythmias. Arrhythmias are problems with the speed or rhythm of the heartbeat. The monitor is a small, portable device. You can wear one while you do your normal daily activities. This is usually used to diagnose what is causing palpitations/syncope (passing out).  Follow-Up: Your physician recommends that you schedule a follow-up appointment in: June with Dr. Harl Bowie  Any Other Special Instructions Will Be Listed Below (If Applicable).  If you need a refill on your cardiac medications before your next appointment, please call your pharmacy. Thank you for choosing [facility]!    Signed, Erma Heritage, PA-C  10/23/2017  2:04 PM    [place] [address] [place], [place] [address] Phone: [phone]

## 2017-10-22 NOTE — Patient Instructions (Signed)
Great to see you!   

## 2017-10-22 NOTE — Progress Notes (Signed)
   HPI  Patient presents today here for follow-up.  Pt is uninsured- cash pay  Anxiety Patient states that since her MI she has had more problems with anxiety.  She reports good medication compliance with citalopram, BuSpar, and Xanax.  She states that Xanax is not quite as effective as it was.  Diabetes Fasting blood sugar 80-120 She is taking 40 units of NPH in the morning and 30 units at night. No hypoglycemia.  Hypertension Good medication compliance She has had an MI since our last visit and she is scheduled for a CABG  PMH: Smoking status noted ROS: Per HPI  Objective: BP 117/76 (BP Location: Left Arm)   Pulse 92   Temp 98.6 F (37 C) (Oral)   Ht 5' (1.524 m)   Wt 158 lb (71.7 kg)   BMI 30.86 kg/m  Gen: NAD, alert, cooperative with exam HEENT: NCAT, EOMI, PERRL CV: RRR, good S1/S2, no murmur Resp: CTABL, no wheezes, non-labored Ext: No edema, warm Neuro: Alert and oriented, No gross deficits  Assessment and plan:  #Type 2 diabetes Well controlled with A1c of 6.7 No changes  #Anxiety Refill Xanax Continue BuSpar plus citalopram No changes   Meds ordered this encounter  Medications  . DISCONTD: ALPRAZolam (XANAX) 0.5 MG tablet    Sig: Take 1 tablet (0.5 mg total) by mouth 3 (three) times daily as needed for anxiety.    Dispense:  75 tablet    Refill:  2  . DISCONTD: gabapentin (NEURONTIN) 800 MG tablet    Sig: Take 1 tablet (800 mg total) by mouth 4 (four) times daily.    Dispense:  120 tablet    Refill:  5  . ALPRAZolam (XANAX) 0.5 MG tablet    Sig: Take 1 tablet (0.5 mg total) by mouth 3 (three) times daily as needed for anxiety.    Dispense:  75 tablet    Refill:  2  . gabapentin (NEURONTIN) 800 MG tablet    Sig: Take 1 tablet (800 mg total) by mouth 4 (four) times daily.    Dispense:  120 tablet    Refill:  [place], MD [facility] 10/22/2017, 4:59 PM

## 2017-10-23 ENCOUNTER — Encounter: Payer: Self-pay | Admitting: Student

## 2017-10-23 ENCOUNTER — Ambulatory Visit (HOSPITAL_COMMUNITY)
Admission: RE | Admit: 2017-10-23 | Discharge: 2017-10-23 | Disposition: A | Discharge status: Home / Self Care | Payer: Self-pay | Source: Ambulatory Visit | Attending: Student | Admitting: Student

## 2017-10-23 ENCOUNTER — Ambulatory Visit (INDEPENDENT_AMBULATORY_CARE_PROVIDER_SITE_OTHER): Payer: Self-pay | Admitting: Student

## 2017-10-23 VITALS — BP 108/76 | HR 89 | Ht 60.0 in | Wt 158.0 lb

## 2017-10-23 DIAGNOSIS — IMO0001 Reserved for inherently not codable concepts without codable children: Secondary | ICD-10-CM

## 2017-10-23 DIAGNOSIS — Z720 Tobacco use: Secondary | ICD-10-CM

## 2017-10-23 DIAGNOSIS — E119 Type 2 diabetes mellitus without complications: Secondary | ICD-10-CM

## 2017-10-23 DIAGNOSIS — Z794 Long term (current) use of insulin: Secondary | ICD-10-CM

## 2017-10-23 DIAGNOSIS — Z79899 Other long term (current) drug therapy: Secondary | ICD-10-CM

## 2017-10-23 DIAGNOSIS — E785 Hyperlipidemia, unspecified: Secondary | ICD-10-CM

## 2017-10-23 DIAGNOSIS — R002 Palpitations: Secondary | ICD-10-CM | POA: Insufficient documentation

## 2017-10-23 DIAGNOSIS — I2510 Atherosclerotic heart disease of native coronary artery without angina pectoris: Secondary | ICD-10-CM

## 2017-10-23 NOTE — Patient Instructions (Signed)
Medication Instructions:  Your physician recommends that you continue on your current medications as directed. Please refer to the Current Medication list given to you today.   Labwork: Your physician recommends that you return for lab work fasting.   Testing/Procedures: Your physician has recommended that you wear a holter monitor. Holter monitors are medical devices that record the heart's electrical activity. Doctors most often use these monitors to diagnose arrhythmias. Arrhythmias are problems with the speed or rhythm of the heartbeat. The monitor is a small, portable device. You can wear one while you do your normal daily activities. This is usually used to diagnose what is causing palpitations/syncope (passing out).    Follow-Up: Your physician recommends that you schedule a follow-up appointment in: June with Dr. Harl Bowie   Any Other Special Instructions Will Be Listed Below (If Applicable).     If you need a refill on your cardiac medications before your next appointment, please call your pharmacy. Thank you for choosing [facility]!

## 2017-10-25 ENCOUNTER — Other Ambulatory Visit (HOSPITAL_COMMUNITY)
Admission: RE | Admit: 2017-10-25 | Discharge: 2017-10-25 | Disposition: A | Discharge status: Home / Self Care | Payer: Self-pay | Source: Ambulatory Visit | Attending: Student | Admitting: Student

## 2017-10-25 DIAGNOSIS — E785 Hyperlipidemia, unspecified: Secondary | ICD-10-CM | POA: Insufficient documentation

## 2017-10-25 DIAGNOSIS — Z79899 Other long term (current) drug therapy: Secondary | ICD-10-CM | POA: Insufficient documentation

## 2017-10-25 LAB — LIPID PANEL
Cholesterol: 165 mg/dL (ref 0–200)
HDL: 35 mg/dL — ABNORMAL LOW (ref >40)
LDL CALC: 98 mg/dL (ref 0–99)
Total CHOL/HDL Ratio: 4.7 RATIO
Triglycerides: 159 mg/dL — ABNORMAL HIGH (ref <150)
VLDL: 32 mg/dL (ref 0–40)

## 2017-10-25 LAB — COMPREHENSIVE METABOLIC PANEL
ALBUMIN: 3.9 g/dL (ref 3.5–5.0)
ALK PHOS: 90 U/L (ref 38–126)
ALT: 17 U/L (ref 14–54)
ANION GAP: 9 (ref 5–15)
AST: 17 U/L (ref 15–41)
BILIRUBIN TOTAL: 0.4 mg/dL (ref 0.3–1.2)
BUN: 13 mg/dL (ref 6–20)
CALCIUM: 9.7 mg/dL (ref 8.9–10.3)
CO2: 26 mmol/L (ref 22–32)
Chloride: 104 mmol/L (ref 101–111)
Creatinine, Ser: 0.65 mg/dL (ref 0.44–1.00)
GFR calc Af Amer: >60 mL/min (ref >60)
GFR calc non Af Amer: >60 mL/min (ref >60)
GLUCOSE: 193 mg/dL — AB (ref 65–99)
Potassium: 4.8 mmol/L (ref 3.5–5.1)
SODIUM: 139 mmol/L (ref 135–145)
TOTAL PROTEIN: 8.1 g/dL (ref 6.5–8.1)

## 2017-10-25 LAB — MAGNESIUM: MAGNESIUM: 1.9 mg/dL (ref 1.7–2.4)

## 2017-10-28 ENCOUNTER — Encounter: Payer: Self-pay | Admitting: Thoracic Surgery (Cardiothoracic Vascular Surgery)

## 2017-11-24 ENCOUNTER — Other Ambulatory Visit: Payer: Self-pay | Admitting: Family Medicine

## 2017-11-24 DIAGNOSIS — I10 Essential (primary) hypertension: Secondary | ICD-10-CM

## 2017-11-25 ENCOUNTER — Ambulatory Visit (INDEPENDENT_AMBULATORY_CARE_PROVIDER_SITE_OTHER): Payer: Self-pay | Admitting: Thoracic Surgery (Cardiothoracic Vascular Surgery)

## 2017-11-25 ENCOUNTER — Encounter: Payer: Self-pay | Admitting: Thoracic Surgery (Cardiothoracic Vascular Surgery)

## 2017-11-25 ENCOUNTER — Other Ambulatory Visit: Payer: Self-pay

## 2017-11-25 VITALS — BP 124/88 | HR 97 | Resp 18 | Ht 60.0 in | Wt 160.4 lb

## 2017-11-25 DIAGNOSIS — I25119 Atherosclerotic heart disease of native coronary artery with unspecified angina pectoris: Secondary | ICD-10-CM

## 2017-11-25 NOTE — Progress Notes (Signed)
[address]       [place] [address]             [phone]     CARDIOTHORACIC SURGERY OFFICE NOTE  Referring Provider is Branch, Alphonse Guild, MD PCP is Timmothy Euler, MD  HPI:  Patient is a 47 year old moderately obese female with history of hypertension, type 2 diabetes mellitus, hyperlipidemia, long-standing tobacco abuse, and a strong family history of coronary artery disease who presented with an acute inferior wall ST segment elevation myocardial infarction, was treated with PCI and stenting of the right coronary artery in early February of this year who returns to the office today with tentative plans to proceed with elective coronary artery bypass grafting on December 12, 2017.  She was originally seen in consultation on October 21, 2017.  She reports that since then she has remained clinically stable.  At her last office visit with Bernerd Pho at [facility] she complained of palpitations.  She underwent a 48-hour Holter monitor which revealed only sinus rhythm with sinus tachycardia and no sustained irregular heart rhythms.  She returns for office today and reports that she is doing reasonably well.  She is still having palpitations.  She has tried to stop smoking.  She states that she only occasionally smokes a cigarette.  She has not had any substernal chest pain or chest tightness similar to that which prompted her presentation in February.    Current Outpatient Medications  Medication Sig Dispense Refill  . ALPRAZolam (XANAX) 0.5 MG tablet Take 1 tablet (0.5 mg total) by mouth 3 (three) times daily as needed for anxiety. 75 tablet 2  . aspirin 81 MG chewable tablet Chew 1 tablet (81 mg total) by mouth daily.    Marland Kitchen atorvastatin (LIPITOR) 80 MG tablet TAKE 1 TABLET BY MOUTH ONCE DAILY AT  6PM 90 tablet 1  . busPIRone (BUSPAR) 10 MG tablet Take 1 tablet (10 mg total) by mouth 3 (three) times daily. 90 tablet 1  . carvedilol (COREG) 3.125 MG tablet Take  1 tablet (3.125 mg total) by mouth 2 (two) times daily with a meal. 180 tablet 3  . citalopram (CELEXA) 40 MG tablet TAKE 1 TABLET BY MOUTH ONCE DAILY 90 tablet 0  . clopidogrel (PLAVIX) 75 MG tablet Take 1 tablet (75 mg total) by mouth daily. On day 1 take 300 mg (4 tablets) , then take 75 mg daily. 94 tablet 3  . cyclobenzaprine (FLEXERIL) 10 MG tablet Take 1 tablet (10 mg total) by mouth 3 (three) times daily as needed for muscle spasms. 30 tablet 1  . gabapentin (NEURONTIN) 800 MG tablet Take 1 tablet (800 mg total) by mouth 4 (four) times daily. 120 tablet 5  . hydrochlorothiazide (MICROZIDE) 12.5 MG capsule TAKE 1 CAPSULE BY MOUTH ONCE DAILY 90 capsule 0  . hydrOXYzine (ATARAX/VISTARIL) 50 MG tablet Take 1 tablet (50 mg total) by mouth every 8 (eight) hours as needed. (Patient taking differently: Take 50 mg by mouth every 8 (eight) hours as needed for anxiety or itching. ) 270 tablet 3  . insulin NPH Human (HUMULIN N,NOVOLIN N) 100 UNIT/ML injection Inject 0.4 mLs (40 Units total) 2 (two) times daily before a meal into the skin. (Patient taking differently: Inject 30-40 Units into the skin See admin instructions. 40 units in the morning and 30 units at night) 30 mL 3  . INSULIN SYRINGE .5CC/29G 29G X 1/2" 0.5 ML MISC 1 Syringe by Does not  apply route 2 (two) times daily. 100 each 11  . lisinopril-hydrochlorothiazide (PRINZIDE,ZESTORETIC) 20-12.5 MG tablet   3  . metFORMIN (GLUCOPHAGE) 1000 MG tablet Take 1 tablet (1,000 mg total) by mouth 2 (two) times daily with a meal. 60 tablet 11  . nitroGLYCERIN (NITROSTAT) 0.4 MG SL tablet Place 1 tablet (0.4 mg total) under the tongue every 5 (five) minutes as needed. 25 tablet 0  . omeprazole (PRILOSEC) 20 MG capsule Take 20 mg by mouth daily.     No current facility-administered medications for this visit.       Physical Exam:   BP 124/88   Pulse 97   Resp 18   Ht 5' (1.524 m)   Wt 160 lb 6.4 oz (72.8 kg)   SpO2 99% Comment: RA  BMI 31.33  kg/m   General:  Moderately obese female in no distress  Chest:   Clear to auscultation with symmetrical breath sounds  CV:   Regular rate and rhythm  Incisions:  n/a  Abdomen:  Soft and nontender  Extremities:  Warm and well-perfused  Diagnostic Tests:  n/a   Impression:  Patient has left main and three-vessel coronary artery disease.  She recently presented with an acute inferior wall ST segment elevation myocardial infarction that was successfully treated using PCI and stenting of the mid right coronary artery using a drug eluting stent.  She had an angiographically good result following PCI and she has remained clinically stable with no recurrent symptoms of chest pain or chest tightness.  She does still get short of breath with moderate and low-level activity.  I have personally reviewed the patient's diagnostic cardiac catheterization and transthoracic echocardiogram.  She has significant greater than 50% stenosis of the distal left main coronary artery with ostial stenosis of both the left anterior descending coronary artery and the left circumflex coronary artery.  I agree she would likely benefit from surgical revascularization.   Plan:  I have again reviewed the indications, risk, and potential benefits of coronary artery bypass grafting with the patient in the office today.  Expectations for her postoperative convalescence have been discussed..  The patient understands and accepts all potential associated risks of surgery including but not limited to risk of death, stroke or other neurologic complication, myocardial infarction, congestive heart failure, respiratory failure, renal failure, bleeding requiring blood transfusion and/or reexploration, aortic dissection or other major vascular complication, arrhythmia, heart block or bradycardia requiring permanent pacemaker, pneumonia, pleural effusion, wound infection, pulmonary embolus or other thromboembolic complication, chronic pain  or other delayed complications related to median sternotomy, or the late recurrence of symptomatic ischemic heart disease and/or congestive heart failure.  The importance of long term risk modification have been emphasized.   She has been instructed to stop taking Plavix 7 days prior to surgery.  She will remain on all other medications through the night before surgery.  All questions answered.     I spent in excess of 15 minutes during the conduct of this office consultation and >50% of this time involved direct face-to-face encounter with the patient for counseling and/or coordination of their care.   Valentina Gu. Roxy Manns, MD 11/25/2017 12:12 PM

## 2017-11-25 NOTE — Patient Instructions (Addendum)
Stop smoking immediately  Stop taking Plavix 7 days prior to surgery  Continue taking all other medications without change through the day before surgery.  Have nothing to eat or drink after midnight the night before surgery.  On the morning of surgery take only your carvedilol with a sip of water.  Take 1/2 your regular dose of insulin.

## 2017-12-04 ENCOUNTER — Other Ambulatory Visit: Payer: Self-pay

## 2017-12-04 ENCOUNTER — Emergency Department (HOSPITAL_COMMUNITY): Payer: PRIVATE HEALTH INSURANCE

## 2017-12-04 ENCOUNTER — Observation Stay (HOSPITAL_COMMUNITY)
Admission: EM | Admit: 2017-12-04 | Discharge: 2017-12-06 | Disposition: A | Discharge status: Home / Self Care | Payer: PRIVATE HEALTH INSURANCE | Attending: Internal Medicine | Admitting: Internal Medicine

## 2017-12-04 DIAGNOSIS — R0602 Shortness of breath: Secondary | ICD-10-CM | POA: Insufficient documentation

## 2017-12-04 DIAGNOSIS — I10 Essential (primary) hypertension: Secondary | ICD-10-CM | POA: Insufficient documentation

## 2017-12-04 DIAGNOSIS — E785 Hyperlipidemia, unspecified: Secondary | ICD-10-CM

## 2017-12-04 DIAGNOSIS — E119 Type 2 diabetes mellitus without complications: Secondary | ICD-10-CM | POA: Insufficient documentation

## 2017-12-04 DIAGNOSIS — Z7902 Long term (current) use of antithrombotics/antiplatelets: Secondary | ICD-10-CM | POA: Insufficient documentation

## 2017-12-04 DIAGNOSIS — I2510 Atherosclerotic heart disease of native coronary artery without angina pectoris: Secondary | ICD-10-CM

## 2017-12-04 DIAGNOSIS — R079 Chest pain, unspecified: Secondary | ICD-10-CM

## 2017-12-04 DIAGNOSIS — Z7982 Long term (current) use of aspirin: Secondary | ICD-10-CM | POA: Insufficient documentation

## 2017-12-04 DIAGNOSIS — Z794 Long term (current) use of insulin: Secondary | ICD-10-CM | POA: Insufficient documentation

## 2017-12-04 DIAGNOSIS — I249 Acute ischemic heart disease, unspecified: Secondary | ICD-10-CM | POA: Insufficient documentation

## 2017-12-04 DIAGNOSIS — F32A Depression, unspecified: Secondary | ICD-10-CM | POA: Diagnosis present

## 2017-12-04 DIAGNOSIS — F329 Major depressive disorder, single episode, unspecified: Secondary | ICD-10-CM | POA: Insufficient documentation

## 2017-12-04 DIAGNOSIS — Z720 Tobacco use: Secondary | ICD-10-CM | POA: Diagnosis present

## 2017-12-04 DIAGNOSIS — F17210 Nicotine dependence, cigarettes, uncomplicated: Secondary | ICD-10-CM | POA: Insufficient documentation

## 2017-12-04 DIAGNOSIS — Z955 Presence of coronary angioplasty implant and graft: Secondary | ICD-10-CM | POA: Insufficient documentation

## 2017-12-04 DIAGNOSIS — F419 Anxiety disorder, unspecified: Principal | ICD-10-CM | POA: Insufficient documentation

## 2017-12-04 DIAGNOSIS — I252 Old myocardial infarction: Secondary | ICD-10-CM | POA: Insufficient documentation

## 2017-12-04 DIAGNOSIS — K219 Gastro-esophageal reflux disease without esophagitis: Secondary | ICD-10-CM | POA: Diagnosis present

## 2017-12-04 DIAGNOSIS — I2583 Coronary atherosclerosis due to lipid rich plaque: Secondary | ICD-10-CM

## 2017-12-04 DIAGNOSIS — Z8541 Personal history of malignant neoplasm of cervix uteri: Secondary | ICD-10-CM | POA: Insufficient documentation

## 2017-12-04 LAB — CBC WITH DIFFERENTIAL/PLATELET
ABS IMMATURE GRANULOCYTES: 0 10*3/uL (ref 0.0–0.1)
BASOS ABS: 0.1 10*3/uL (ref 0.0–0.1)
BASOS PCT: 0 %
EOS ABS: 0.3 10*3/uL (ref 0.0–0.7)
Eosinophils Relative: 2 %
HCT: 39.4 % (ref 36.0–46.0)
Hemoglobin: 13.2 g/dL (ref 12.0–15.0)
Immature Granulocytes: 0 %
Lymphocytes Relative: 35 %
Lymphs Abs: 4.2 10*3/uL — ABNORMAL HIGH (ref 0.7–4.0)
MCH: 29.9 pg (ref 26.0–34.0)
MCHC: 33.5 g/dL (ref 30.0–36.0)
MCV: 89.3 fL (ref 78.0–100.0)
MONO ABS: 0.7 10*3/uL (ref 0.1–1.0)
Monocytes Relative: 6 %
NEUTROS ABS: 6.5 10*3/uL (ref 1.7–7.7)
NEUTROS PCT: 57 %
PLATELETS: 319 10*3/uL (ref 150–400)
RBC: 4.41 MIL/uL (ref 3.87–5.11)
RDW: 14.5 % (ref 11.5–15.5)
WBC: 11.8 10*3/uL — ABNORMAL HIGH (ref 4.0–10.5)

## 2017-12-04 NOTE — ED Triage Notes (Signed)
Pt to ED from home, midsternal cp radiation to r chest with nausea but no vomiting. 324 ASA and 2 SLN prior to EMS arrival. EMS administered 3 more SLN with no relief so gave 10 mg morphine, and now pain free. Pt scheduled for bypass surgery next week. NAD

## 2017-12-04 NOTE — ED Provider Notes (Signed)
[facility] EMERGENCY DEPARTMENT Provider Note   CSN: [id number] Arrival date & time: 12/04/17  2245     History   Chief Complaint Chief Complaint  Patient presents with  . Chest Pain    HPI Melissa Rubio is a 47 y.o. female.   47 y.o. female past medical history of CAD (s/p inferior STEMI in 08/2017 with DES to RCA and residual distal LM, ostial LAD, and LCx disease noted with possible CABG in the future), HTN, HLD, IDDM, and tobacco use  Patient presents via EMS with substernal chest pain onset around 9:20 PM while she was watching television.  2 nitroglycerin at home without relief and EMS was called.  She received a total of 5 nitroglycerin and 10 mg of morphine is now chest pain-free.  The pain radiates to her right breast is associated with shortness of breath and nausea.  The pain is similar to when she had an MI in February.  She is being scheduled for a CABG on June 6.  She has not had any chest pain since her MI in February.  Her had to use nitroglycerin.  She denies any abdominal pain, nausea, vomiting, cough or fever.  No focal weakness, numbness or tingling.  She denies any history of acid reflux or ulcers.  The history is provided by the patient and the EMS personnel.  Chest Pain   Associated symptoms include nausea and shortness of breath. Pertinent negatives include no abdominal pain, no dizziness, no fever, no headaches, no vomiting and no weakness.    Past Medical History:  Diagnosis Date  . Anxiety   . Cancer [facility]) 1994   cervical  . Coronary artery disease    2/19 PCI/DES x1 to dRCA with LM, LAD disease (planned for possible CABG in near future), normal EF  . Coronary artery disease involving native coronary artery of native heart with angina pectoris ([place])   . Diabetes ([place])   . Hypertension   . Itching   . Myocardial infarction ([place])   . RLS (restless legs syndrome)   . Tobacco use     Patient Active Problem List   Diagnosis Date  Noted  . Coronary artery disease involving native coronary artery of native heart with angina pectoris ([place])   . Tobacco use 09/03/2017  . STEMI (ST elevation myocardial infarction) ([place]) 09/01/2017  . STEMI involving right coronary artery ([place]) 09/01/2017  . Insomnia 07/23/2015  . BMI 29.0-29.9,adult 07/22/2015  . GERD (gastroesophageal reflux disease) 11/15/2014  . Depression 11/15/2014  . Diabetes ([place])   . Anxiety   . Hypertension   . RLS (restless legs syndrome)     Past Surgical History:  Procedure Laterality Date  . CARDIAC CATHETERIZATION    . CORONARY STENT INTERVENTION N/A 09/01/2017   Procedure: CORONARY STENT INTERVENTION;  Surgeon: Martinique, Peter M, MD;  Location: [facility];  Service: Cardiovascular;  Laterality: N/A;  . CORONARY/GRAFT ACUTE MI REVASCULARIZATION N/A 09/01/2017   Procedure: Coronary/Graft Acute MI Revascularization;  Surgeon: Martinique, Peter M, MD;  Location: [facility];  Service: Cardiovascular;  Laterality: N/A;  . LEFT HEART CATH AND CORONARY ANGIOGRAPHY N/A 09/01/2017   Procedure: LEFT HEART CATH AND CORONARY ANGIOGRAPHY;  Surgeon: Martinique, Peter M, MD;  Location: [facility];  Service: Cardiovascular;  Laterality: N/A;  . PARTIAL HYSTERECTOMY  2009     OB History   None      Home Medications    Prior to Admission medications  Medication Sig Start Date End Date Taking? Authorizing Provider  acetaminophen (TYLENOL) 325 MG tablet Take 650 mg by mouth 2 (two) times daily as needed for moderate pain or headache.    [provider]  ALPRAZolam Duanne Moron) 0.5 MG tablet Take 1 tablet (0.5 mg total) by mouth 3 (three) times daily as needed for anxiety. 10/22/17   Timmothy Euler, MD  aspirin 81 MG chewable tablet Chew 1 tablet (81 mg total) by mouth daily. 09/04/17   Cheryln Manly, NP  atorvastatin (LIPITOR) 80 MG tablet TAKE 1 TABLET BY MOUTH ONCE DAILY AT  [facility] 09/30/17   Arnoldo Lenis, MD  busPIRone (BUSPAR) 10 MG  tablet Take 1 tablet (10 mg total) by mouth 3 (three) times daily. 03/12/17   Claretta Fraise, MD  carvedilol (COREG) 3.125 MG tablet Take 1 tablet (3.125 mg total) by mouth 2 (two) times daily with a meal. 09/19/17   Branch, Alphonse Guild, MD  cephALEXin (KEFLEX) 500 MG capsule Take 500 mg by mouth 3 (three) times daily. 12/02/17   [provider]  citalopram (CELEXA) 40 MG tablet TAKE 1 TABLET BY MOUTH ONCE DAILY 11/25/17   Claretta Fraise, MD  clopidogrel (PLAVIX) 75 MG tablet Take 1 tablet (75 mg total) by mouth daily. On day 1 take 300 mg (4 tablets) , then take 75 mg daily. Patient taking differently: Take 75 mg by mouth daily.  09/19/17   Arnoldo Lenis, MD  cyclobenzaprine (FLEXERIL) 10 MG tablet Take 1 tablet (10 mg total) by mouth 3 (three) times daily as needed for muscle spasms. 04/11/17   Timmothy Euler, MD  gabapentin (NEURONTIN) 800 MG tablet Take 1 tablet (800 mg total) by mouth 4 (four) times daily. 10/22/17   Timmothy Euler, MD  hydrochlorothiazide (MICROZIDE) 12.5 MG capsule TAKE 1 CAPSULE BY MOUTH ONCE DAILY 11/25/17   Claretta Fraise, MD  hydrOXYzine (ATARAX/VISTARIL) 50 MG tablet Take 1 tablet (50 mg total) by mouth every 8 (eight) hours as needed. Patient taking differently: Take 50 mg by mouth every 8 (eight) hours as needed for anxiety or itching.  02/06/17   Claretta Fraise, MD  ibuprofen (ADVIL,MOTRIN) 800 MG tablet Take 800 mg by mouth 2 (two) times daily as needed for moderate pain.    [provider]  insulin NPH Human (HUMULIN N,NOVOLIN N) 100 UNIT/ML injection Inject 0.4 mLs (40 Units total) 2 (two) times daily before a meal into the skin. Patient taking differently: Inject 30-40 Units into the skin See admin instructions. 40 units in the morning and 30 units at night 05/23/17   Timmothy Euler, MD  INSULIN SYRINGE .5CC/29G 29G X 1/2" 0.5 ML MISC 1 Syringe by Does not apply route 2 (two) times daily. 04/11/17   Timmothy Euler, MD  Menthol, Topical  Analgesic, (ICY HOT EX) Apply 1 application topically daily as needed (back pain).    [provider]  metFORMIN (GLUCOPHAGE) 1000 MG tablet Take 1 tablet (1,000 mg total) by mouth 2 (two) times daily with a meal. 02/06/17   Claretta Fraise, MD  nitroGLYCERIN (NITROSTAT) 0.4 MG SL tablet Place 1 tablet (0.4 mg total) under the tongue every 5 (five) minutes as needed. Patient taking differently: Place 0.4 mg under the tongue every 5 (five) minutes as needed for chest pain.  09/03/17   Cheryln Manly, NP  omeprazole (PRILOSEC) 20 MG capsule Take 20 mg by mouth daily.    [provider]    Family History Family  History  Problem Relation Age of Onset  . Cancer Mother   . Heart attack Father   . Diabetes Sister   . Diabetes Brother   . Diabetes Sister   . Heart attack Sister     Social History Social History   Tobacco Use  . Smoking status: Current Some Day Smoker    Packs/day: 0.75    Years: 35.00    Pack years: 26.25    Types: Cigarettes    Start date: 09/01/1981  . Smokeless tobacco: Never Used  . Tobacco comment: 1/2 pack daily  Substance Use Topics  . Alcohol use: No    Frequency: Never  . Drug use: Never     Allergies   Patient has no known allergies.   Review of Systems Review of Systems  Constitutional: Negative for activity change, appetite change and fever.  HENT: Negative for congestion and rhinorrhea.   Respiratory: Positive for chest tightness and shortness of breath.   Cardiovascular: Positive for chest pain.  Gastrointestinal: Positive for nausea. Negative for abdominal pain and vomiting.  Genitourinary: Negative for dysuria, hematuria and vaginal bleeding.  Musculoskeletal: Negative for arthralgias and myalgias.  Skin: Negative for rash.  Neurological: Positive for light-headedness. Negative for dizziness, weakness and headaches.    all other systems are negative except as noted in the HPI and PMH.    Physical Exam Updated Vital  Signs BP 109/90 (BP Location: Right Arm)   Pulse 88   Temp 98.4 F (36.9 C) (Oral)   Ht 5' (1.524 m)   Wt 72.6 kg (160 lb)   SpO2 99%   BMI 31.25 kg/m   Physical Exam  Constitutional: She is oriented to person, place, and time. She appears well-developed and well-nourished. No distress.  Appears anxious  HENT:  Head: Normocephalic and atraumatic.  Mouth/Throat: Oropharynx is clear and moist. No oropharyngeal exudate.  Eyes: Pupils are equal, round, and reactive to light. Conjunctivae and EOM are normal.  Neck: Normal range of motion. Neck supple.  No meningismus.  Cardiovascular: Normal rate, regular rhythm, normal heart sounds and intact distal pulses.  No murmur heard. Equal radial and DP pulses.  Pulmonary/Chest: Effort normal and breath sounds normal. No respiratory distress. She exhibits no tenderness.  Abdominal: Soft. There is no tenderness. There is no rebound and no guarding.  Musculoskeletal: Normal range of motion. She exhibits no edema or tenderness.  Neurological: She is alert and oriented to person, place, and time. No cranial nerve deficit. She exhibits normal muscle tone. Coordination normal.  No ataxia on finger to nose bilaterally. No pronator drift. 5/5 strength throughout. CN 2-12 intact.Equal grip strength. Sensation intact.   Skin: Skin is warm. Capillary refill takes less than 2 seconds.  Psychiatric: She has a normal mood and affect. Her behavior is normal.  Nursing note and vitals reviewed.    ED Treatments / Results  Labs (all labs ordered are listed, but only abnormal results are displayed) Labs Reviewed  CBC WITH DIFFERENTIAL/PLATELET - Abnormal; Notable for the following components:      Result Value   WBC 11.8 (*)    Lymphs Abs 4.2 (*)    All other components within normal limits  COMPREHENSIVE METABOLIC PANEL - Abnormal; Notable for the following components:   Potassium 3.4 (*)    All other components within normal limits  RAPID URINE DRUG  SCREEN, HOSP PERFORMED - Abnormal; Notable for the following components:   Opiates POSITIVE (*)    All other components within normal  limits  HEMOGLOBIN A1C - Abnormal; Notable for the following components:   Hgb A1c MFr Bld 7.1 (*)    All other components within normal limits  BRAIN NATRIURETIC PEPTIDE  TROPONIN I  TROPONIN I  HCG, QUANTITATIVE, PREGNANCY  LIPID PANEL  TROPONIN I  TROPONIN I    EKG EKG Interpretation  Date/Time:  Wednesday Dec 04 2017 23:00:18 EDT Ventricular Rate:  89 PR Interval:    QRS Duration: 80 QT Interval:  369 QTC Calculation: 447 R Axis:   53 Text Interpretation:  Sinus rhythm Inferior Q waves noted Confirmed by Ezequiel Essex [phone]) on 12/04/2017 11:06:34 PM   Radiology Dg Chest 2 View  Result Date: 12/04/2017 CLINICAL DATA:  Acute onset of generalized chest pain. EXAM: CHEST - 2 VIEW COMPARISON:  None. FINDINGS: The lungs are well-aerated. Mild peribronchial thickening is noted. There is no evidence of focal opacification, pleural effusion or pneumothorax. The heart is normal in size; the mediastinal contour is within normal limits. No acute osseous abnormalities are seen. IMPRESSION: Mild peribronchial thickening noted.  Lungs otherwise clear. Electronically Signed   By: Garald Balding M.D.   On: 12/04/2017 23:27    Procedures Procedures (including critical care time)  Medications Ordered in ED Medications - No data to display  [facility] Feb 2019 1. 3 vessel obstructive CAD. 100% distal RCA is the culprit vessel. She also has 50% distal left main, 65% ostial LAD, and 80% ostial LCx 2. Inferior wall motion abnormality with preserved LV systolic function 3. Mildly elevated LVEDP 4. Successful stenting of the distal RCA with DES    Initial Impression / Assessment and Plan / ED Course  I have reviewed the triage vital signs and the nursing notes.  Pertinent labs & imaging results that were available during my care of the patient were reviewed  by me and considered in my medical decision making (see chart for details).    Patient with known severe CAD presenting with central chest pain onset while she was resting.  Pain is now resolved after nitroglycerin and morphine.  EKG without acute ST elevation.   patient remains chest pain-free.  Troponin is negative.  Discussed with cardiology Dr. Therisa Doyne. He agrees with cardiac rule out but does not feel patient needs stress test or Catheterization. States heparin not necessary unless enzymes become positive.   Low suspicion for PE or aortic dissection. Plan ACS observation r/o.  D/w Dr. Blaine Hamper.   Final Clinical Impressions(s) / ED Diagnoses   Final diagnoses:  Chest pain, unspecified type  Acute coronary syndrome [facility])    ED Discharge Orders    None       Ezequiel Essex, MD 12/05/17 [phone]

## 2017-12-04 NOTE — ED Notes (Signed)
ED Provider at bedside. 

## 2017-12-04 NOTE — Progress Notes (Signed)
STEMI alert by EMS. Repeat EKG compared and reviewed. Not STEMI, alert canceled. Patient's prior records under the name "Melissa Rubio", sees [facility].

## 2017-12-05 ENCOUNTER — Other Ambulatory Visit (HOSPITAL_COMMUNITY): Payer: Self-pay

## 2017-12-05 ENCOUNTER — Encounter (HOSPITAL_COMMUNITY): Payer: Self-pay | Admitting: General Practice

## 2017-12-05 ENCOUNTER — Other Ambulatory Visit: Payer: Self-pay

## 2017-12-05 DIAGNOSIS — I2510 Atherosclerotic heart disease of native coronary artery without angina pectoris: Secondary | ICD-10-CM

## 2017-12-05 DIAGNOSIS — E785 Hyperlipidemia, unspecified: Secondary | ICD-10-CM

## 2017-12-05 DIAGNOSIS — I2583 Coronary atherosclerosis due to lipid rich plaque: Secondary | ICD-10-CM

## 2017-12-05 DIAGNOSIS — R079 Chest pain, unspecified: Secondary | ICD-10-CM | POA: Diagnosis present

## 2017-12-05 DIAGNOSIS — E119 Type 2 diabetes mellitus without complications: Secondary | ICD-10-CM

## 2017-12-05 LAB — CBG MONITORING, ED: Glucose-Capillary: 70 mg/dL (ref 65–99)

## 2017-12-05 LAB — RAPID URINE DRUG SCREEN, HOSP PERFORMED
AMPHETAMINES: NOT DETECTED
BENZODIAZEPINES: NOT DETECTED
Barbiturates: NOT DETECTED
COCAINE: NOT DETECTED
OPIATES: POSITIVE — AB
TETRAHYDROCANNABINOL: NOT DETECTED

## 2017-12-05 LAB — COMPREHENSIVE METABOLIC PANEL
ALBUMIN: 3.5 g/dL (ref 3.5–5.0)
ALT: 16 U/L (ref 14–54)
AST: 16 U/L (ref 15–41)
Alkaline Phosphatase: 79 U/L (ref 38–126)
Anion gap: 10 (ref 5–15)
BILIRUBIN TOTAL: 0.4 mg/dL (ref 0.3–1.2)
BUN: 11 mg/dL (ref 6–20)
CHLORIDE: 110 mmol/L (ref 101–111)
CO2: 23 mmol/L (ref 22–32)
CREATININE: 0.96 mg/dL (ref 0.44–1.00)
Calcium: 9.6 mg/dL (ref 8.9–10.3)
GFR calc Af Amer: >60 mL/min (ref >60)
GLUCOSE: 67 mg/dL (ref 65–99)
Potassium: 3.4 mmol/L — ABNORMAL LOW (ref 3.5–5.1)
Sodium: 143 mmol/L (ref 135–145)
TOTAL PROTEIN: 6.8 g/dL (ref 6.5–8.1)

## 2017-12-05 LAB — TROPONIN I
Troponin I: <0.03 ng/mL (ref <0.03)
Troponin I: <0.03 ng/mL (ref <0.03)

## 2017-12-05 LAB — HEPARIN LEVEL (UNFRACTIONATED): Heparin Unfractionated: 0.24 IU/mL — ABNORMAL LOW (ref 0.30–0.70)

## 2017-12-05 LAB — LIPID PANEL
CHOL/HDL RATIO: 5.8 ratio
Cholesterol: 162 mg/dL (ref 0–200)
HDL: 28 mg/dL — AB (ref >40)
LDL Cholesterol: 96 mg/dL (ref 0–99)
Triglycerides: 191 mg/dL — ABNORMAL HIGH (ref <150)
VLDL: 38 mg/dL (ref 0–40)

## 2017-12-05 LAB — HEMOGLOBIN A1C
Hgb A1c MFr Bld: 7.1 % — ABNORMAL HIGH (ref 4.8–5.6)
Mean Plasma Glucose: 157.07 mg/dL

## 2017-12-05 LAB — HCG, QUANTITATIVE, PREGNANCY: HCG, BETA CHAIN, QUANT, S: 2 m[IU]/mL (ref <5)

## 2017-12-05 LAB — GLUCOSE, CAPILLARY
GLUCOSE-CAPILLARY: 196 mg/dL — AB (ref 65–99)
Glucose-Capillary: 78 mg/dL (ref 65–99)
Glucose-Capillary: 75 mg/dL (ref 65–99)

## 2017-12-05 LAB — MAGNESIUM: Magnesium: 1.8 mg/dL (ref 1.7–2.4)

## 2017-12-05 LAB — BRAIN NATRIURETIC PEPTIDE: B NATRIURETIC PEPTIDE 5: 17.7 pg/mL (ref 0.0–100.0)

## 2017-12-05 MED ORDER — ONDANSETRON HCL 4 MG/2ML IJ SOLN: 4.0000 mg | Freq: Four times a day (QID) | INTRAMUSCULAR | Status: DC | PRN

## 2017-12-05 MED ORDER — HEPARIN (PORCINE) IN NACL 100-0.45 UNIT/ML-% IJ SOLN
850.0000 [IU]/h | INTRAMUSCULAR | Status: DC
  Filled 2017-12-05: qty 250

## 2017-12-05 MED ORDER — CARVEDILOL 3.125 MG PO TABS
3.1250 mg | ORAL_TABLET | Freq: Two times a day (BID) | ORAL | Status: DC
  Administered 2017-12-05 – 2017-12-06 (×2): 3.125 mg via ORAL
  Filled 2017-12-05 (×4): qty 1

## 2017-12-05 MED ORDER — CEPHALEXIN 500 MG PO CAPS
500.0000 mg | ORAL_CAPSULE | Freq: Three times a day (TID) | ORAL | Status: DC
  Administered 2017-12-05 – 2017-12-06 (×4): 500 mg via ORAL
  Filled 2017-12-05 (×3): qty 1
  Filled 2017-12-05: qty 2

## 2017-12-05 MED ORDER — HEPARIN BOLUS VIA INFUSION
3000.0000 [IU] | Freq: Once | INTRAVENOUS | Status: DC
  Filled 2017-12-05: qty 3000

## 2017-12-05 MED ORDER — BUSPIRONE HCL 5 MG PO TABS
10.0000 mg | ORAL_TABLET | Freq: Three times a day (TID) | ORAL | Status: DC
  Administered 2017-12-05 – 2017-12-06 (×4): 10 mg via ORAL
  Filled 2017-12-05 (×2): qty 2
  Filled 2017-12-05: qty 1
  Filled 2017-12-05: qty 2

## 2017-12-05 MED ORDER — NICOTINE 21 MG/24HR TD PT24
21.0000 mg | MEDICATED_PATCH | Freq: Every day | TRANSDERMAL | Status: DC
  Filled 2017-12-05 (×2): qty 1

## 2017-12-05 MED ORDER — HYDROCHLOROTHIAZIDE 12.5 MG PO CAPS
12.5000 mg | ORAL_CAPSULE | Freq: Every day | ORAL | Status: DC
  Administered 2017-12-05 – 2017-12-06 (×2): 12.5 mg via ORAL
  Filled 2017-12-05 (×2): qty 1

## 2017-12-05 MED ORDER — ISOSORBIDE MONONITRATE ER 30 MG PO TB24
15.0000 mg | ORAL_TABLET | Freq: Every day | ORAL | Status: DC
  Administered 2017-12-05: 15 mg via ORAL
  Filled 2017-12-05: qty 1

## 2017-12-05 MED ORDER — NITROGLYCERIN 0.4 MG SL SUBL: 0.4000 mg | SUBLINGUAL_TABLET | SUBLINGUAL | Status: DC | PRN

## 2017-12-05 MED ORDER — CITALOPRAM HYDROBROMIDE 20 MG PO TABS
40.0000 mg | ORAL_TABLET | Freq: Every day | ORAL | Status: DC
  Administered 2017-12-05 – 2017-12-06 (×2): 40 mg via ORAL
  Filled 2017-12-05: qty 2
  Filled 2017-12-05: qty 4

## 2017-12-05 MED ORDER — ISOSORBIDE MONONITRATE ER 30 MG PO TB24
15.0000 mg | ORAL_TABLET | Freq: Once | ORAL | Status: AC
  Administered 2017-12-05: 15 mg via ORAL
  Filled 2017-12-05: qty 1

## 2017-12-05 MED ORDER — GRX ANALGESIC BALM EX OINT
1.0000 "application " | TOPICAL_OINTMENT | Freq: Every day | CUTANEOUS | Status: DC | PRN
  Filled 2017-12-05: qty 28

## 2017-12-05 MED ORDER — HEPARIN BOLUS VIA INFUSION
4000.0000 [IU] | Freq: Once | INTRAVENOUS | Status: AC
  Administered 2017-12-05: 4000 [IU] via INTRAVENOUS
  Filled 2017-12-05: qty 4000

## 2017-12-05 MED ORDER — ACETAMINOPHEN 325 MG PO TABS
650.0000 mg | ORAL_TABLET | Freq: Four times a day (QID) | ORAL | Status: DC | PRN
  Administered 2017-12-06: 650 mg via ORAL
  Filled 2017-12-05: qty 2

## 2017-12-05 MED ORDER — ATORVASTATIN CALCIUM 80 MG PO TABS
80.0000 mg | ORAL_TABLET | Freq: Every day | ORAL | Status: DC
  Administered 2017-12-05: 80 mg via ORAL
  Filled 2017-12-05 (×2): qty 1

## 2017-12-05 MED ORDER — ENOXAPARIN SODIUM 40 MG/0.4ML ~~LOC~~ SOLN
40.0000 mg | SUBCUTANEOUS | Status: DC
  Administered 2017-12-05: 40 mg via SUBCUTANEOUS
  Filled 2017-12-05: qty 0.4

## 2017-12-05 MED ORDER — PANTOPRAZOLE SODIUM 40 MG PO TBEC
40.0000 mg | DELAYED_RELEASE_TABLET | Freq: Every day | ORAL | Status: DC
  Administered 2017-12-05 – 2017-12-06 (×2): 40 mg via ORAL
  Filled 2017-12-05 (×2): qty 1

## 2017-12-05 MED ORDER — METOCLOPRAMIDE HCL 5 MG/ML IJ SOLN
10.0000 mg | Freq: Once | INTRAMUSCULAR | Status: AC
  Administered 2017-12-05: 10 mg via INTRAVENOUS
  Filled 2017-12-05: qty 2

## 2017-12-05 MED ORDER — CYCLOBENZAPRINE HCL 10 MG PO TABS: 10.0000 mg | ORAL_TABLET | Freq: Three times a day (TID) | ORAL | Status: DC | PRN

## 2017-12-05 MED ORDER — HYDRALAZINE HCL 20 MG/ML IJ SOLN: 5.0000 mg | INTRAMUSCULAR | Status: DC | PRN

## 2017-12-05 MED ORDER — POTASSIUM CHLORIDE 20 MEQ/15ML (10%) PO SOLN
20.0000 meq | Freq: Once | ORAL | Status: AC
Start: 2017-12-05 — End: 2017-12-05
  Administered 2017-12-05: 20 meq via ORAL
  Filled 2017-12-05: qty 15

## 2017-12-05 MED ORDER — ZOLPIDEM TARTRATE 5 MG PO TABS: 5.0000 mg | ORAL_TABLET | Freq: Every evening | ORAL | Status: DC | PRN

## 2017-12-05 MED ORDER — INSULIN ASPART 100 UNIT/ML ~~LOC~~ SOLN: 0.0000 [IU] | Freq: Three times a day (TID) | SUBCUTANEOUS | Status: DC

## 2017-12-05 MED ORDER — MORPHINE SULFATE (PF) 4 MG/ML IV SOLN
2.0000 mg | INTRAVENOUS | Status: DC | PRN
  Administered 2017-12-05: 2 mg via INTRAVENOUS
  Filled 2017-12-05: qty 1

## 2017-12-05 MED ORDER — GABAPENTIN 400 MG PO CAPS
800.0000 mg | ORAL_CAPSULE | Freq: Four times a day (QID) | ORAL | Status: DC
  Administered 2017-12-05 – 2017-12-06 (×5): 800 mg via ORAL
  Filled 2017-12-05 (×5): qty 2

## 2017-12-05 MED ORDER — HEPARIN (PORCINE) IN NACL 100-0.45 UNIT/ML-% IJ SOLN
1100.0000 [IU]/h | INTRAMUSCULAR | Status: DC
  Administered 2017-12-05: 900 [IU]/h via INTRAVENOUS
  Administered 2017-12-06: 1100 [IU]/h via INTRAVENOUS
  Filled 2017-12-05 (×2): qty 250

## 2017-12-05 MED ORDER — CLOPIDOGREL BISULFATE 75 MG PO TABS
75.0000 mg | ORAL_TABLET | Freq: Every day | ORAL | Status: DC
  Administered 2017-12-05: 75 mg via ORAL
  Filled 2017-12-05: qty 1

## 2017-12-05 MED ORDER — HEPARIN BOLUS VIA INFUSION
4000.0000 [IU] | Freq: Once | INTRAVENOUS | Status: DC
  Filled 2017-12-05: qty 4000

## 2017-12-05 MED ORDER — HYDROXYZINE HCL 25 MG PO TABS: 50.0000 mg | ORAL_TABLET | Freq: Three times a day (TID) | ORAL | Status: DC | PRN

## 2017-12-05 MED ORDER — ALPRAZOLAM 0.5 MG PO TABS
0.5000 mg | ORAL_TABLET | Freq: Three times a day (TID) | ORAL | Status: DC | PRN
Start: 2017-12-05 — End: 2017-12-06

## 2017-12-05 MED ORDER — ASPIRIN 81 MG PO CHEW
81.0000 mg | CHEWABLE_TABLET | Freq: Every day | ORAL | Status: DC
  Administered 2017-12-05 – 2017-12-06 (×2): 81 mg via ORAL
  Filled 2017-12-05 (×2): qty 1

## 2017-12-05 MED ORDER — INSULIN NPH (HUMAN) (ISOPHANE) 100 UNIT/ML ~~LOC~~ SUSP
20.0000 [IU] | Freq: Two times a day (BID) | SUBCUTANEOUS | Status: DC
  Administered 2017-12-05 – 2017-12-06 (×2): 20 [IU] via SUBCUTANEOUS
  Filled 2017-12-05 (×2): qty 10

## 2017-12-05 MED ORDER — ALPRAZOLAM 0.25 MG PO TABS: 0.2500 mg | ORAL_TABLET | Freq: Two times a day (BID) | ORAL | Status: DC | PRN

## 2017-12-05 MED ORDER — ISOSORBIDE MONONITRATE ER 30 MG PO TB24
30.0000 mg | ORAL_TABLET | Freq: Every day | ORAL | Status: DC
  Administered 2017-12-06: 30 mg via ORAL
  Filled 2017-12-05: qty 1

## 2017-12-05 MED ORDER — KETOROLAC TROMETHAMINE 30 MG/ML IJ SOLN
30.0000 mg | Freq: Once | INTRAMUSCULAR | Status: AC
  Administered 2017-12-05: 30 mg via INTRAVENOUS
  Filled 2017-12-05: qty 1

## 2017-12-05 MED ORDER — DIPHENHYDRAMINE HCL 50 MG/ML IJ SOLN
25.0000 mg | Freq: Once | INTRAMUSCULAR | Status: AC
  Administered 2017-12-05: 25 mg via INTRAVENOUS
  Filled 2017-12-05: qty 1

## 2017-12-05 NOTE — Plan of Care (Signed)
  Problem: Education: Goal: Knowledge of General Education information will improve Outcome: Completed/Met   Problem: Activity: Goal: Risk for activity intolerance will decrease Outcome: Completed/Met

## 2017-12-05 NOTE — ED Notes (Signed)
Patient resting denies any pain at this time.

## 2017-12-05 NOTE — H&P (Signed)
History and Physical    Arlie Posch PZW:[id number] DOB: 11/09/1970 DOA: 12/04/2017  Referring MD/NP/PA:   PCP: Timmothy Euler, MD   Patient coming from:  The patient is coming from home.  At baseline, pt is independent for most of ADL.  Chief Complaint: Chest pain  HPI: Melissa Rubio is a 47 y.o. female with medical history significant of hypertension, hyperlipidemia, diabetes mellitus, GERD, depression, anxiety, CAD, STEMI, stent placement, tobacco abuse, who presents with chest pain.  Patient states that she had hx of CAD with stent placement, she is scheduled for CABG next week by Dr. Roxy Manns.  Patient started having chest pain at about 9:20 PM.  Her chest pain is located in substernal area, constant, sharp, 6 out of 10 severity initially, currently 0 out of 10 severity, associated with palpitation, no shortness of breath. Patient has mild dry cough, no fever or chills. Patient has nausea, no vomiting, diarrhea or abdominal pain.  No symptoms of UTI or unilateral weakness.  ED Course: pt was found to have negative troponin, BNP 17.7, WBC 11.8, potassium 3.4, creatinine normal, temperature normal, no tachycardia, oxygen saturation 99% on room air, chest x-ray showed peribronchial thickening.  Patient is placed on telemetry bed for observation.  Cardiology fellow, Dr. Teena Dunk was consulted by EDP, who recommended to not start heparin unless troponin becomes positive.  Review of Systems:   General: no fevers, chills, no body weight gain, has fatigue HEENT: no blurry vision, hearing changes or sore throat Respiratory: no dyspnea, has coughing, no wheezing CV: has chest pain, palpitations GI: has nausea, no vomiting, abdominal pain, diarrhea, constipation GU: no dysuria, burning on urination, increased urinary frequency, hematuria  Ext: no leg edema Neuro: no unilateral weakness, numbness, or tingling, no vision change or hearing loss Skin: no rash, no skin tear. MSK: No muscle spasm, no  deformity, no limitation of range of movement in spin Heme: No easy bruising.  Travel history: No recent long distant travel.  Allergy: No Known Allergies  Past Medical History:  Diagnosis Date  . Anxiety   . Cancer [facility]) 1994   cervical  . Coronary artery disease    2/19 PCI/DES x1 to dRCA with LM, LAD disease (planned for possible CABG in near future), normal EF  . Coronary artery disease involving native coronary artery of native heart with angina pectoris ([place])   . Diabetes ([place])   . Hypertension   . Itching   . Myocardial infarction ([place])   . RLS (restless legs syndrome)   . Tobacco use     Past Surgical History:  Procedure Laterality Date  . CARDIAC CATHETERIZATION    . CORONARY STENT INTERVENTION N/A 09/01/2017   Procedure: CORONARY STENT INTERVENTION;  Surgeon: Martinique, Peter M, MD;  Location: [place];  Service: Cardiovascular;  Laterality: N/A;  . CORONARY/GRAFT ACUTE MI REVASCULARIZATION N/A 09/01/2017   Procedure: Coronary/Graft Acute MI Revascularization;  Surgeon: Martinique, Peter M, MD;  Location: [place] [facility];  Service: Cardiovascular;  Laterality: N/A;  . LEFT HEART CATH AND CORONARY ANGIOGRAPHY N/A 09/01/2017   Procedure: LEFT HEART CATH AND CORONARY ANGIOGRAPHY;  Surgeon: Martinique, Peter M, MD;  Location: [employer] [facility];  Service: Cardiovascular;  Laterality: N/A;  . PARTIAL HYSTERECTOMY  2009    Social History:  reports that she has been smoking cigarettes.  She started smoking about 36 years ago. She has a 26.25 pack-year smoking history. She has never used smokeless tobacco. She reports that she does not drink  alcohol or use drugs.  Family History:  Family History  Problem Relation Age of Onset  . Cancer Mother   . Heart attack Father   . Diabetes Sister   . Diabetes Brother   . Diabetes Sister   . Heart attack Sister      Prior to Admission medications   Medication Sig Start Date End Date Taking? Authorizing Provider  acetaminophen  (TYLENOL) 325 MG tablet Take 650 mg by mouth 2 (two) times daily as needed for moderate pain or headache.   Yes [provider]  ALPRAZolam (XANAX) 0.5 MG tablet Take 1 tablet (0.5 mg total) by mouth 3 (three) times daily as needed for anxiety. 10/22/17  Yes Timmothy Euler, MD  aspirin 81 MG chewable tablet Chew 1 tablet (81 mg total) by mouth daily. 09/04/17  Yes Reino Bellis B, NP  atorvastatin (LIPITOR) 80 MG tablet TAKE 1 TABLET BY MOUTH ONCE DAILY AT  6PM 09/30/17  Yes Branch, Alphonse Guild, MD  busPIRone (BUSPAR) 10 MG tablet Take 1 tablet (10 mg total) by mouth 3 (three) times daily. 03/12/17  Yes Claretta Fraise, MD  carvedilol (COREG) 3.125 MG tablet Take 1 tablet (3.125 mg total) by mouth 2 (two) times daily with a meal. 09/19/17  Yes Branch, Alphonse Guild, MD  cephALEXin (KEFLEX) 500 MG capsule Take 500 mg by mouth 3 (three) times daily. 12/02/17  Yes [provider]  citalopram (CELEXA) 40 MG tablet TAKE 1 TABLET BY MOUTH ONCE DAILY 11/25/17  Yes Stacks, Cletus Gash, MD  clopidogrel (PLAVIX) 75 MG tablet Take 1 tablet (75 mg total) by mouth daily. On day 1 take 300 mg (4 tablets) , then take 75 mg daily. Patient taking differently: Take 75 mg by mouth daily.  09/19/17  Yes Branch, Alphonse Guild, MD  cyclobenzaprine (FLEXERIL) 10 MG tablet Take 1 tablet (10 mg total) by mouth 3 (three) times daily as needed for muscle spasms. 04/11/17  Yes Timmothy Euler, MD  gabapentin (NEURONTIN) 800 MG tablet Take 1 tablet (800 mg total) by mouth 4 (four) times daily. 10/22/17  Yes Timmothy Euler, MD  hydrochlorothiazide (MICROZIDE) 12.5 MG capsule TAKE 1 CAPSULE BY MOUTH ONCE DAILY 11/25/17  Yes Claretta Fraise, MD  hydrOXYzine (ATARAX/VISTARIL) 50 MG tablet Take 1 tablet (50 mg total) by mouth every 8 (eight) hours as needed. Patient taking differently: Take 50 mg by mouth every 8 (eight) hours as needed for anxiety or itching.  02/06/17  Yes Claretta Fraise, MD  ibuprofen (ADVIL,MOTRIN) 800 MG  tablet Take 800 mg by mouth 2 (two) times daily as needed for moderate pain.   Yes [provider]  insulin NPH Human (HUMULIN N,NOVOLIN N) 100 UNIT/ML injection Inject 0.4 mLs (40 Units total) 2 (two) times daily before a meal into the skin. Patient taking differently: Inject 30-40 Units into the skin See admin instructions. 40 units in the morning and 30 units at night 05/23/17  Yes Timmothy Euler, MD  Menthol, Topical Analgesic, (ICY HOT EX) Apply 1 application topically daily as needed (back pain).   Yes [provider]  metFORMIN (GLUCOPHAGE) 1000 MG tablet Take 1 tablet (1,000 mg total) by mouth 2 (two) times daily with a meal. 02/06/17  Yes Stacks, Cletus Gash, MD  nitroGLYCERIN (NITROSTAT) 0.4 MG SL tablet Place 1 tablet (0.4 mg total) under the tongue every 5 (five) minutes as needed. Patient taking differently: Place 0.4 mg under the tongue every 5 (five) minutes as needed for chest pain.  09/03/17  Yes Reino Bellis B, NP  omeprazole (PRILOSEC) 20 MG capsule Take 20 mg by mouth daily.   Yes [provider]  INSULIN SYRINGE .5CC/29G 29G X 1/2" 0.5 ML MISC 1 Syringe by Does not apply route 2 (two) times daily. 04/11/17   Timmothy Euler, MD    Physical Exam: Vitals:   12/05/17 0315 12/05/17 0400 12/05/17 0500 12/05/17 0515  BP: 102/66 106/70 102/62 108/67  Pulse: 65 71 73 70  Resp: (!) 21 11 12 11   Temp:      TempSrc:      SpO2: 96% 91%  90%  Weight:      Height:       General: Not in acute distress HEENT:       Eyes: PERRL, EOMI, no scleral icterus.       ENT: No discharge from the ears and nose, no pharynx injection, no tonsillar enlargement.        Neck: No JVD, no bruit, no mass felt. Heme: No neck lymph node enlargement. Cardiac: S1/S2, RRR, No murmurs, No gallops or rubs. Respiratory: No rales, wheezing, rhonchi or rubs. GI: Soft, nondistended, nontender, no rebound pain, no organomegaly, BS present. GU: No hematuria Ext: No pitting leg  edema bilaterally. 2+DP/PT pulse bilaterally. Musculoskeletal: No joint deformities, No joint redness or warmth, no limitation of ROM in spin. Skin: No rashes.  Neuro: Alert, oriented X3, cranial nerves II-XII grossly intact, moves all extremities normally. Psych: Patient is not psychotic, no suicidal or hemocidal ideation.  Labs on Admission: I have personally reviewed following labs and imaging studies  CBC: Recent Labs  Lab 12/04/17 2302  WBC 11.8*  NEUTROABS 6.5  HGB 13.2  HCT 39.4  MCV 89.3  PLT 412   Basic Metabolic Panel: Recent Labs  Lab 12/04/17 2302  NA 143  K 3.4*  CL 110  CO2 23  GLUCOSE 67  BUN 11  CREATININE 0.96  CALCIUM 9.6   GFR: Estimated Creatinine Clearance: 65.1 mL/min (by C-G formula based on SCr of 0.96 mg/dL). Liver Function Tests: Recent Labs  Lab 12/04/17 2302  AST 16  ALT 16  ALKPHOS 79  BILITOT 0.4  PROT 6.8  ALBUMIN 3.5   No results for input(s): LIPASE, AMYLASE in the last 168 hours. No results for input(s): AMMONIA in the last 168 hours. Coagulation Profile: No results for input(s): INR, PROTIME in the last 168 hours. Cardiac Enzymes: Recent Labs  Lab 12/04/17 2302 12/05/17 0316  TROPONINI <0.03 <0.03   BNP (last 3 results) No results for input(s): PROBNP in the last 8760 hours. HbA1C: No results for input(s): HGBA1C in the last 72 hours. CBG: No results for input(s): GLUCAP in the last 168 hours. Lipid Profile: No results for input(s): CHOL, HDL, LDLCALC, TRIG, CHOLHDL, LDLDIRECT in the last 72 hours. Thyroid Function Tests: No results for input(s): TSH, T4TOTAL, FREET4, T3FREE, THYROIDAB in the last 72 hours. Anemia Panel: No results for input(s): VITAMINB12, FOLATE, FERRITIN, TIBC, IRON, RETICCTPCT in the last 72 hours. Urine analysis: No results found for: COLORURINE, APPEARANCEUR, LABSPEC, PHURINE, GLUCOSEU, HGBUR, BILIRUBINUR, KETONESUR, PROTEINUR, UROBILINOGEN, NITRITE, LEUKOCYTESUR Sepsis  Labs: @LABRCNTIP (procalcitonin:4,lacticidven:4) )No results found for this or any previous visit (from the past 240 hour(s)).   Radiological Exams on Admission: Dg Chest 2 View  Result Date: 12/04/2017 CLINICAL DATA:  Acute onset of generalized chest pain. EXAM: CHEST - 2 VIEW COMPARISON:  None. FINDINGS: The lungs are well-aerated. Mild peribronchial thickening is noted. There is no evidence of focal opacification,  pleural effusion or pneumothorax. The heart is normal in size; the mediastinal contour is within normal limits. No acute osseous abnormalities are seen. IMPRESSION: Mild peribronchial thickening noted.  Lungs otherwise clear. Electronically Signed   By: Garald Balding M.D.   On: 12/04/2017 23:27     EKG: Independently reviewed.  Sinus rhythm, QTC 447, Q waves in inferior leads, low voltage, ST depression in lead III/aVF mildly  Assessment/Plan Principal Problem:   Chest pain Active Problems:   Anxiety   GERD (gastroesophageal reflux disease)   Depression   Tobacco use   Diabetes mellitus without complication (HCC)   Chest pain: Troponin negative.  Cardiology was consulted, Dr. Teena Dunk recommended not to start IV heparin unless troponin becomes positive.  Currently patient is chest pain-ree. Pt scheduled for CABG next week by Dr. Roxy Manns  - will place on Tele bed for obs - cycle CE q6 x3 and repeat EKG in the am  - prn Nitroglycerin, Morphine, and aspirin, Plavix Lipitor, coreg - Risk factor stratification: will check FLP,UDS and A1C  - 2d echo - inpt card consult was requested via Epic  Depression and anxiety: Stable, no suicidal or homicidal ideations. -Continue home medications: BuSpar, Celexa, prn Xanax  HLD: -Lipitor  HTN:  -Continue home medications: Coreg -IV hydralazine prn   Type II diabetes mellitus without complication: Last S9Q 6.7 on 09/02/2017, well controled. Patient is taking NPH insulin, metformin at home -will decrease NPH insulin dose from 40-30  to 20 U bid  -SSI  GERD: -Protonix  Tobacco abuse: -Did counseling about importance of quitting smoking -Nicotine patch    DVT ppx:  SQ Lovenox Code Status: Full code Family Communication: None at bed side.  Disposition Plan:  Anticipate discharge back to previous home environment Consults called:  Card, Dr. Teena Dunk Admission status: Obs / tele    Date of Service 12/05/2017    Ivor Costa [facility] Pager [phone]  If 7PM-7AM, please contact night-coverage [email or web address] Password [facility] 12/05/2017, 5:33 AM

## 2017-12-05 NOTE — Progress Notes (Signed)
ANTICOAGULATION CONSULT NOTE - Initial Consult  Pharmacy Consult for heparin Indication: chest pain/ACS  No Known Allergies  Patient Measurements: Height: 5' (152.4 cm) Weight: 160 lb (72.6 kg) IBW/kg (Calculated) : 45.5 Heparin Dosing Weight: 61.6 kg  Vital Signs: BP: 106/62 (05/30 1230) Pulse Rate: 63 (05/30 1334)  Labs: Recent Labs    12/04/17 2302 12/05/17 0316 12/05/17 0852  HGB 13.2  --   --   HCT 39.4  --   --   PLT 319  --   --   CREATININE 0.96  --   --   TROPONINI <0.03 <0.03 <0.03    Estimated Creatinine Clearance: 65.1 mL/min (by C-G formula based on SCr of 0.96 mg/dL).   Medical History: Past Medical History:  Diagnosis Date  . Anxiety   . Cancer [name]) 1994   cervical  . Coronary artery disease    2/19 PCI/DES x1 to dRCA with LM, LAD disease (planned for possible CABG in near future), normal EF  . Coronary artery disease involving native coronary artery of native heart with angina pectoris ([place])   . Diabetes ([place])   . Hypertension   . Itching   . Myocardial infarction ([place])   . RLS (restless legs syndrome)   . Tobacco use     Medications:  See medication history  Assessment: 47 yo lady to start heparin for CP.  Heparin held overnight. She was not on anticoagulation PTA. Goal of Therapy:  Heparin level 0.3-0.7 units/ml Monitor platelets by anticoagulation protocol: Yes   Plan:  Heparin bolus 4000 units and start drip at 900 units/hr Check heparin level in 6 hours Daily HL and CBC while on heparin Monitor for bleeding complications Thanks for allowing pharmacy to be a part of this patient's care.  Laqueta Linden PharmD Clinical Pharmacist 12/05/2017,1:45 PM

## 2017-12-05 NOTE — Consult Note (Signed)
Cardiology Consultation:   Patient ID: Melissa Rubio; [id number]; 1970/07/10   Admit date: 12/04/2017 Date of Consult: 12/05/2017  Primary Care Provider: Timmothy Euler, Rubio Primary Cardiologist: Melissa Dolly, Rubio   Patient Profile:   Melissa Rubio is a 47 y.o. female with a hx of hypertension, type 2 diabetes, hyperlipidemia, long-standing tobacco abuse, obesity, GERD, strong family history of coronary artery disease and inferior STEMI with RCA stent in 08/2017 who is being seen today for the evaluation of chest pain at the request of DR. Mikhail.  History of Present Illness:   Melissa Rubio had an acute inferior wall ST segment elevation myocardial infarction in 08/2017 which was treated with PCI and stenting of the right coronary artery which was felt to be the culprit.  She also had residual 50% stenosis of the distal left main, 65% ostial LAD and 80% ostial left circumflex disease.  She was started on dual antiplatelet therapy and at the time it was decided to treat her medically as her left coronary artery was not well suited to PCI.  It was noted that the patient may benefit from coronary artery bypass grafting due to her left main stenosis once healed from her MI.She was seen by cardiothoracic surgeon, Dr. Roxy Rubio and arranged for CABG on 12/12/17.  She was seen in our office on 10/21/2017 with complaints of palpitations.  She had not been having any chest pain but was having fatigue and dyspnea on exertion. She underwent a 48-hour Holter monitor which revealed normal sinus rhythm with sinus tachycardia and no sustained irregular rhythms.  Upon my assessment Melissa Rubio reports that she was sitting down watching television at about 920 last night when she developed substernal sharp, shooting chest pain that was similar to the discomfort with her MI in February but much less severe.  This was nonradiating and accompanied by mild dyspnea but no lightheadedness, nausea, diaphoresis.  The discomfort  continued for several minutes and she took a sublingual nitroglycerin.  After 5 minutes she was still having discomfort so she took a second sublingual nitroglycerin and called 911.  Her discomfort had improved but not resolved.  She was given 3 more nitroglycerin and morphine in the ambulance and her pain completely resolved.  She has had no chest discomfort since that time.  This is the first time that she is experienced any type of chest pain since her MI.  She denies orthopnea, PND or edema.  She continues to have palpitations every day that feel like a fluttering and sometimes feels like an ocean wave in her chest.  Occasionally the fluttering lasts for several minutes and is accompanied by mild shortness of breath but no dizziness or syncope.  She has been undergoing preparations for her surgery that is scheduled for June 6.  She has been taking Plavix and was to stop the Plavix today.  She was given a dose of Plavix this morning in the ED.  Past Medical History:  Diagnosis Date  . Anxiety   . Cancer [facility]) 1994   cervical  . Coronary artery disease    2/19 PCI/DES x1 to dRCA with LM, LAD disease (planned for possible CABG in near future), normal EF  . Coronary artery disease involving native coronary artery of native heart with angina pectoris ([place])   . Diabetes ([place])   . Hypertension   . Itching   . Myocardial infarction ([place])   . RLS (restless legs syndrome)   . Tobacco use     Past  Surgical History:  Procedure Laterality Date  . CARDIAC CATHETERIZATION    . CORONARY STENT INTERVENTION N/A 09/01/2017   Procedure: CORONARY STENT INTERVENTION;  Surgeon: Melissa Rubio;  Location: [place] [facility];  Service: Cardiovascular;  Laterality: N/A;  . CORONARY/GRAFT ACUTE MI REVASCULARIZATION N/A 09/01/2017   Procedure: Coronary/Graft Acute MI Revascularization;  Surgeon: Melissa Rubio;  Location: [place] [facility];  Service: Cardiovascular;  Laterality: N/A;  . LEFT HEART CATH  AND CORONARY ANGIOGRAPHY N/A 09/01/2017   Procedure: LEFT HEART CATH AND CORONARY ANGIOGRAPHY;  Surgeon: Melissa Rubio;  Location: [place] [facility];  Service: Cardiovascular;  Laterality: N/A;  . PARTIAL HYSTERECTOMY  2009     Home Medications:  Prior to Admission medications   Medication Sig Start Date End Date Taking? Authorizing Provider  acetaminophen (TYLENOL) 325 MG tablet Take 650 mg by mouth 2 (two) times daily as needed for moderate pain or headache.   Yes Provider, Historical, Rubio  ALPRAZolam (XANAX) 0.5 MG tablet Take 1 tablet (0.5 mg total) by mouth 3 (three) times daily as needed for anxiety. 10/22/17  Yes Melissa Euler, Rubio  aspirin 81 MG chewable tablet Chew 1 tablet (81 mg total) by mouth daily. 09/04/17  Yes Melissa Rubio  atorvastatin (LIPITOR) 80 MG tablet TAKE 1 TABLET BY MOUTH ONCE DAILY AT  6PM 09/30/17  Yes Melissa Rubio  busPIRone (BUSPAR) 10 MG tablet Take 1 tablet (10 mg total) by mouth 3 (three) times daily. 03/12/17  Yes Melissa Rubio  carvedilol (COREG) 3.125 MG tablet Take 1 tablet (3.125 mg total) by mouth 2 (two) times daily with a meal. 09/19/17  Yes Melissa Rubio  cephALEXin (KEFLEX) 500 MG capsule Take 500 mg by mouth 3 (three) times daily. 12/02/17  Yes Provider, Historical, Rubio  citalopram (CELEXA) 40 MG tablet TAKE 1 TABLET BY MOUTH ONCE DAILY 11/25/17  Yes Melissa Rubio  clopidogrel (PLAVIX) 75 MG tablet Take 1 tablet (75 mg total) by mouth daily. On day 1 take 300 mg (4 tablets) , then take 75 mg daily. Patient taking differently: Take 75 mg by mouth daily.  09/19/17  Yes Melissa Rubio  cyclobenzaprine (FLEXERIL) 10 MG tablet Take 1 tablet (10 mg total) by mouth 3 (three) times daily as needed for muscle spasms. 04/11/17  Yes Melissa Euler, Rubio  gabapentin (NEURONTIN) 800 MG tablet Take 1 tablet (800 mg total) by mouth 4 (four) times daily. 10/22/17  Yes Melissa Euler, Rubio  hydrochlorothiazide (MICROZIDE)  12.5 MG capsule TAKE 1 CAPSULE BY MOUTH ONCE DAILY 11/25/17  Yes Melissa Rubio  hydrOXYzine (ATARAX/VISTARIL) 50 MG tablet Take 1 tablet (50 mg total) by mouth every 8 (eight) hours as needed. Patient taking differently: Take 50 mg by mouth every 8 (eight) hours as needed for anxiety or itching.  02/06/17  Yes Melissa Rubio  ibuprofen (ADVIL,MOTRIN) 800 MG tablet Take 800 mg by mouth 2 (two) times daily as needed for moderate pain.   Yes Provider, Historical, Rubio  insulin NPH Human (HUMULIN N,NOVOLIN N) 100 UNIT/ML injection Inject 0.4 mLs (40 Units total) 2 (two) times daily before a meal into the skin. Patient taking differently: Inject 30-40 Units into the skin See admin instructions. 40 units in the morning and 30 units at night 05/23/17  Yes Melissa Euler, Rubio  Menthol, Topical Analgesic, (ICY HOT EX) Apply 1 application topically daily as needed (back pain).  Yes Provider, Historical, Rubio  metFORMIN (GLUCOPHAGE) 1000 MG tablet Take 1 tablet (1,000 mg total) by mouth 2 (two) times daily with a meal. 02/06/17  Yes Melissa Rubio  nitroGLYCERIN (NITROSTAT) 0.4 MG SL tablet Place 1 tablet (0.4 mg total) under the tongue every 5 (five) minutes as needed. Patient taking differently: Place 0.4 mg under the tongue every 5 (five) minutes as needed for chest pain.  09/03/17  Yes Cheryln Manly, Rubio  omeprazole (PRILOSEC) 20 MG capsule Take 20 mg by mouth daily.   Yes Provider, Historical, Rubio  INSULIN SYRINGE .5CC/29G 29G X 1/2" 0.5 ML MISC 1 Syringe by Does not apply route 2 (two) times daily. 04/11/17   Melissa Euler, Rubio    Inpatient Medications: Scheduled Meds: . aspirin  81 mg Oral Daily  . atorvastatin  80 mg Oral q1800  . busPIRone  10 mg Oral TID  . carvedilol  3.125 mg Oral BID WC  . cephALEXin  500 mg Oral TID  . citalopram  40 mg Oral Daily  . clopidogrel  75 mg Oral Daily  . enoxaparin (LOVENOX) injection  40 mg Subcutaneous Q24H  . gabapentin  800 mg Oral QID  .  hydrochlorothiazide  12.5 mg Oral Daily  . insulin aspart  0-9 Units Subcutaneous TID WC  . insulin NPH Human  20 Units Subcutaneous BID AC & HS  . nicotine  21 mg Transdermal Daily  . pantoprazole  40 mg Oral Daily   Continuous Infusions:  PRN Meds: acetaminophen, ALPRAZolam, cyclobenzaprine, GRX ANALGESIC BALM, hydrALAZINE, hydrOXYzine, morphine injection, nitroGLYCERIN, ondansetron (ZOFRAN) IV, zolpidem  Allergies:   No Known Allergies  Social History:   Social History   Socioeconomic History  . Marital status: Married    Spouse name: Not on file  . Number of children: Not on file  . Years of education: Not on file  . Highest education level: Not on file  Occupational History  . Not on file  Social Needs  . Financial resource strain: Not on file  . Food insecurity:    Worry: Not on file    Inability: Not on file  . Transportation needs:    Medical: Not on file    Non-medical: Not on file  Tobacco Use  . Smoking status: Current Some Day Smoker    Packs/day: 0.75    Years: 35.00    Pack years: 26.25    Types: Cigarettes    Start date: 09/01/1981  . Smokeless tobacco: Never Used  . Tobacco comment: 1/2 pack daily  Substance and Sexual Activity  . Alcohol use: No    Frequency: Never  . Drug use: Never  . Sexual activity: Not on file  Lifestyle  . Physical activity:    Days per week: Not on file    Minutes per session: Not on file  . Stress: Not on file  Relationships  . Social connections:    Talks on phone: Not on file    Gets together: Not on file    Attends religious service: Not on file    Active member of club or organization: Not on file    Attends meetings of clubs or organizations: Not on file    Relationship status: Not on file  . Intimate partner violence:    Fear of current or ex partner: Not on file    Emotionally abused: Not on file    Physically abused: Not on file    Forced sexual activity: Not on file  Other Topics Concern  . Not on file    Social History Narrative  . Not on file    Family History:    Family History  Problem Relation Age of Onset  . Cancer Mother   . Heart attack Father   . Diabetes Sister   . Diabetes Brother   . Diabetes Sister   . Heart attack Sister      ROS:  Please see the history of present illness.   All other ROS reviewed and negative.     Physical Exam/Data:   Vitals:   12/05/17 0831 12/05/17 0838 12/05/17 1130 12/05/17 1200  BP: (!) 137/58  121/69 122/65  Pulse: 60 67 (!) 58 62  Resp: 10 12 14 16   Temp:      TempSrc:      SpO2: 95% 96% 97% 95%  Weight:      Height:       No intake or output data in the 24 hours ending 12/05/17 1234 Filed Weights   12/04/17 2256  Weight: 160 lb (72.6 kg)   Body mass index is 31.25 kg/m.  General:  Well nourished, well developed, in no acute distress HEENT: normal Lymph: no adenopathy Neck: no JVD Endocrine:  No thryomegaly Vascular: No carotid bruits; FA pulses 2+ bilaterally without bruits  Cardiac:  normal S1, S2; RRR; no murmur  Lungs:  clear to auscultation bilaterally, no wheezing, rhonchi or rales  Abd: soft, nontender, no hepatomegaly  Ext: no edema Musculoskeletal:  No deformities, BUE and BLE strength normal and equal Skin: warm and dry  Neuro:  CNs 2-12 intact, no focal abnormalities noted Psych:  Normal affect   EKG:  The EKG was personally reviewed and demonstrates: Normal sinus rhythm with only mild nonspecific inferior T wave abnormality Telemetry:  Telemetry was personally reviewed and demonstrates:  Sinus rhythm 50'-80's  Relevant CV Studies:  CORONARY STENT INTERVENTION  LEFT HEART CATH AND CORONARY ANGIOGRAPHY 09/01/17  Conclusion    Dist RCA lesion is 100% stenosed.  Prox RCA to Mid RCA lesion is 25% stenosed.  Dist LM-2 lesion is 50% stenosed.  Dist LM to Ost LAD lesion is 65% stenosed.  Ost Cx to Prox Cx lesion is 80% stenosed.  Post intervention, there is a 0% residual stenosis.  A  drug-eluting stent was successfully placed using a STENT SYNERGY DES 2.5X32.  The left ventricular systolic function is normal.  LV end diastolic pressure is mildly elevated.  The left ventricular ejection fraction is 55-65% by visual estimate.   1. 3 vessel obstructive CAD. 100% distal RCA is the culprit vessel. She also has 50% distal left main, 65% ostial LAD, and 80% ostial LCx 2. Inferior wall motion abnormality with preserved LV systolic function 3. Mildly elevated LVEDP 4. Successful stenting of the distal RCA with DES  Plan: DAPT for one year. Will need to review films with interventional colleagues to decide management of residual disease in LCA. For now will treat medically. It is not well suited for PCI. Will need to decide whether CABG is needed after she has healed from MI.   Diagnostic Diagram       Post-Intervention Diagram          Echocardiogram 09/02/2017 Study Conclusions  - Left ventricle: The cavity size was normal. Wall thickness was   normal. Systolic function was normal. The estimated ejection   fraction was in the range of 55% to 60% with hypokinesis   hypokinesis of the distal inferior, inferoseptal walls.  Laboratory Data:  Chemistry Recent Labs  Lab 12/04/17 2302  NA 143  K 3.4*  CL 110  CO2 23  GLUCOSE 67  BUN 11  CREATININE 0.96  CALCIUM 9.6  GFRNONAA >60  GFRAA >60  ANIONGAP 10    Recent Labs  Lab 12/04/17 2302  PROT 6.8  ALBUMIN 3.5  AST 16  ALT 16  ALKPHOS 79  BILITOT 0.4   Hematology Recent Labs  Lab 12/04/17 2302  WBC 11.8*  RBC 4.41  HGB 13.2  HCT 39.4  MCV 89.3  MCH 29.9  MCHC 33.5  RDW 14.5  PLT 319   Cardiac Enzymes Recent Labs  Lab 12/04/17 2302 12/05/17 0316 12/05/17 0852  TROPONINI <0.03 <0.03 <0.03   No results for input(s): TROPIPOC in the last 168 hours.  BNP Recent Labs  Lab 12/04/17 2302  BNP 17.7    DDimer No results for input(s): DDIMER in the last 168  hours.  Radiology/Studies:  Dg Chest 2 View  Result Date: 12/04/2017 CLINICAL DATA:  Acute onset of generalized chest pain. EXAM: CHEST - 2 VIEW COMPARISON:  None. FINDINGS: The lungs are well-aerated. Mild peribronchial thickening is noted. There is no evidence of focal opacification, pleural effusion or pneumothorax. The heart is normal in size; the mediastinal contour is within normal limits. No acute osseous abnormalities are seen. IMPRESSION: Mild peribronchial thickening noted.  Lungs otherwise clear. Electronically Signed   By: Garald Balding M.D.   On: 12/04/2017 23:27    Assessment and Plan:   Unstable angina in pt with Coronary artery disease -Status post inferior STEMI in 08/2017 with DES to the RCA and residual distal left main, ostial LAD and left circumflex disease noted with possible CABG in the future once healed from her MI.  She was evaluated by Dr. Roxy Rubio and has been scheduled for CABG on 12/12/2017 -Patient developed chest pain last night at rest with mild shortness of breath and no other associated symptoms.  Resolved after sublingual nitroglycerin x5 and morphine.  No further chest pain since that time. -EKG without ischemic changes -Troponins negative x3.  BNP 17.7. -No significant findings on chest x-ray -No objective evidence of myocardial ischemia.  Patient's symptoms are similar to what she experienced with her MI in February but much less severe.  They were responsive to nitroglycerin. -Options include adding long-acting nitrate and allowing patient to discharge home and remain relatively inactive until her surgery versus keeping her in the hospital on IV heparin.  Would only move her surgery up if absolutely necessary considering she has been on Plavix.   -Discussed with Dr. Radford Pax. Will start IV heparin and low dose Imdur and see if her BP tolerates. No further Plavix.  -Dr. Radford Pax to see with further recommendations.   CAD -Status post inferior STEMI in 08/2017 with  DES to the RCA and residual distal left main, ostial LAD and left circumflex disease, planned for CABG on 12/12/17. -Medical therapy includes aspirin, Plavix, statin, BB.  Palpitations -Has been having palpitations daily, fluttering for up to a few minutes. Occasionally with mild shortness of breath but no dizziness or syncope.  -Outpatient 48-hour Holter monitor revealed normal sinus rhythm with sinus tachycardia and no sustained irregular rhythms. -Monitor on telemetry.   HLD - FLP in 08/2017 showed total cholesterol 217, triglycerides 222, HDL 26, and LDL 147. Was started on Atorvastatin 80mg  daily at that time.  - Today her lipid panel shows LDL of 96  IDDM - Hgb A1c at 6.7  in 08/2017, A1c is 7.1 today - SSI while hospitalized. Management per IM.  Tobacco Use - She is only smoking a few cigarettes per week, none since last Tursday. Complete cessation advised.   For questions or updates, please contact [place] Please consult [email or web address] for contact info under Cardiology/STEMI.   SignedDaune Perch, Rubio  12/05/2017 12:34 PM

## 2017-12-05 NOTE — ED Notes (Signed)
Pt CBG 70. Notified Gerald Stabs, Therapist, sports.

## 2017-12-05 NOTE — Progress Notes (Signed)
PROGRESS NOTE    Melissa Rubio  ZOX:[id number] DOB: 1971/03/16 DOA: 12/04/2017 PCP: Timmothy Euler, MD   Brief Narrative:  HPI On 12/05/2017 by Dr. Ivor Costa Catalaya Garr is a 47 y.o. female with medical history significant of hypertension, hyperlipidemia, diabetes mellitus, GERD, depression, anxiety, CAD, STEMI, stent placement, tobacco abuse, who presents with chest pain.  Patient states that she had hx of CAD with stent placement, she is scheduled for CABG next week by Dr. Roxy Manns.  Patient started having chest pain at about 9:20 PM.  Her chest pain is located in substernal area, constant, sharp, 6 out of 10 severity initially, currently 0 out of 10 severity, associated with palpitation, no shortness of breath. Patient has mild dry cough, no fever or chills. Patient has nausea, no vomiting, diarrhea or abdominal pain.  No symptoms of UTI or unilateral weakness.  Assessment & Plan   Patient admitted this morning by Dr. Ivor Costa. See full H&P for details.  Chest pain -Chest pain resolved with 5 nitroglycerin as well as morphine given prior to admission -Patient is scheduled for CABG next week with Dr. Roxy Manns -Troponin currently being cycled and unremarkable -Cardiology consulted and appreciated -Have made Dr. Johnn Hai office aware of patient's admission -Continue morphine, aspirin, statin, Coreg (Plavix held for upcoming CABG) -Echocardiogram 09/02/2017, EF 55 to 60%, with hypokinesis of the distal inferior and inferior septal walls -Patient had a catheterization 09/01/2017 which showed three-vessel obstructive CAD, 100% distal RCA.  50% distal left main, 65% ostial LAD, 80% ostial left circumflex.  Patient had successful stenting of the distal RCA with DES  Depression/anxiety -Continue BuSpar, Celexa, Xanax as needed  Hyperlipidemia -Continue statin  Essential hypertension -Continue Coreg, hydralazine as needed  Diabetes mellitus, type II -Hemoglobin A1c was 6.7 on  09/02/2017 -Metformin held -Continue NPH, insulin sliding scale and CBG monitoring  GERD -Continue PPI  Tobacco abuse -Discussed cessation  -Continue Nicotine patch  Hypokalemia -Placed, will continue to monitor BMP  DVT Prophylaxis Lovenox  Code Status: Full  Family Communication: None at bedside  Disposition Plan: Observation.  Pending cardiology consultation  Consultants Cardiology Cardiothoracic surgery, Dr. Roxy Manns, made aware of patient's admission  Procedures  None  Antibiotics   Anti-infectives (From admission, onward)   Start     Dose/Rate Route Frequency Ordered Stop   12/05/17 1000  cephALEXin (KEFLEX) capsule 500 mg     500 mg Oral 3 times daily 12/05/17 0250        Subjective:   Melissa Rubio seen and examined today.  No longer complaining of chest pain.  Denies current shortness of breath, abdominal pain, nausea, vomiting, diarrhea or constipation, dizziness or headache. Objective:   Vitals:   12/05/17 0831 12/05/17 0838 12/05/17 1130 12/05/17 1200  BP: (!) 137/58  121/69 122/65  Pulse: 60 67 (!) 58 62  Resp: 10 12 14 16   Temp:      TempSrc:      SpO2: 95% 96% 97% 95%  Weight:      Height:       No intake or output data in the 24 hours ending 12/05/17 1253 Filed Weights   12/04/17 2256  Weight: 72.6 kg (160 lb)    Exam  General: Well developed, well nourished, NAD, appears stated age  HEENT: NCAT, PERRLA, EOMI, Anicteic Sclera, mucous membranes moist.   Neck: Supple, no JVD  Cardiovascular: S1 S2 auscultated, no rubs, murmurs or gallops. Regular rate and rhythm.  Respiratory: Clear to auscultation bilaterally with equal chest  rise  Abdomen: Soft, nontender, nondistended, + bowel sounds  Extremities: warm dry without cyanosis clubbing or edema  Neuro: AAOx3, nonfocal  Skin: Without rashes exudates or nodules  Psych: Normal affect and demeanor with intact judgement and insight   Data Reviewed: I have personally reviewed following  labs and imaging studies  CBC: Recent Labs  Lab 12/04/17 2302  WBC 11.8*  NEUTROABS 6.5  HGB 13.2  HCT 39.4  MCV 89.3  PLT 242   Basic Metabolic Panel: Recent Labs  Lab 12/04/17 2302 12/05/17 0852  NA 143  --   K 3.4*  --   CL 110  --   CO2 23  --   GLUCOSE 67  --   BUN 11  --   CREATININE 0.96  --   CALCIUM 9.6  --   MG  --  1.8   GFR: Estimated Creatinine Clearance: 65.1 mL/min (by C-G formula based on SCr of 0.96 mg/dL). Liver Function Tests: Recent Labs  Lab 12/04/17 2302  AST 16  ALT 16  ALKPHOS 79  BILITOT 0.4  PROT 6.8  ALBUMIN 3.5   No results for input(s): LIPASE, AMYLASE in the last 168 hours. No results for input(s): AMMONIA in the last 168 hours. Coagulation Profile: No results for input(s): INR, PROTIME in the last 168 hours. Cardiac Enzymes: Recent Labs  Lab 12/04/17 2302 12/05/17 0316 12/05/17 0852  TROPONINI <0.03 <0.03 <0.03   BNP (last 3 results) No results for input(s): PROBNP in the last 8760 hours. HbA1C: Recent Labs    12/05/17 0338  HGBA1C 7.1*   CBG: Recent Labs  Lab 12/05/17 0731  GLUCAP 70   Lipid Profile: Recent Labs    12/05/17 0338  CHOL 162  HDL 28*  LDLCALC 96  TRIG 191*  CHOLHDL 5.8   Thyroid Function Tests: No results for input(s): TSH, T4TOTAL, FREET4, T3FREE, THYROIDAB in the last 72 hours. Anemia Panel: No results for input(s): VITAMINB12, FOLATE, FERRITIN, TIBC, IRON, RETICCTPCT in the last 72 hours. Urine analysis: No results found for: COLORURINE, APPEARANCEUR, LABSPEC, PHURINE, GLUCOSEU, HGBUR, BILIRUBINUR, KETONESUR, PROTEINUR, UROBILINOGEN, NITRITE, LEUKOCYTESUR Sepsis Labs: @LABRCNTIP (procalcitonin:4,lacticidven:4)  )No results found for this or any previous visit (from the past 240 hour(s)).    Radiology Studies: Dg Chest 2 View  Result Date: 12/04/2017 CLINICAL DATA:  Acute onset of generalized chest pain. EXAM: CHEST - 2 VIEW COMPARISON:  None. FINDINGS: The lungs are  well-aerated. Mild peribronchial thickening is noted. There is no evidence of focal opacification, pleural effusion or pneumothorax. The heart is normal in size; the mediastinal contour is within normal limits. No acute osseous abnormalities are seen. IMPRESSION: Mild peribronchial thickening noted.  Lungs otherwise clear. Electronically Signed   By: Garald Balding M.D.   On: 12/04/2017 23:27     Scheduled Meds: . aspirin  81 mg Oral Daily  . atorvastatin  80 mg Oral q1800  . busPIRone  10 mg Oral TID  . carvedilol  3.125 mg Oral BID WC  . cephALEXin  500 mg Oral TID  . citalopram  40 mg Oral Daily  . clopidogrel  75 mg Oral Daily  . enoxaparin (LOVENOX) injection  40 mg Subcutaneous Q24H  . gabapentin  800 mg Oral QID  . hydrochlorothiazide  12.5 mg Oral Daily  . insulin aspart  0-9 Units Subcutaneous TID WC  . insulin NPH Human  20 Units Subcutaneous BID AC & HS  . nicotine  21 mg Transdermal Daily  . pantoprazole  40 mg  Oral Daily   Continuous Infusions:   LOS: 0 days   Time Spent in minutes   45 minutes   Remi Rester D.O. on 12/05/2017 at 12:53 PM  Between 7am to 7pm - Pager - [phone]  After 7pm go to [email or web address] - password [email or web address]  And look for the night coverage person covering for me after hours  [facility] Office  [phone]

## 2017-12-05 NOTE — Progress Notes (Signed)
ANTICOAGULATION CONSULT NOTE - Initial Consult  Pharmacy Consult for heparin Indication: chest pain/ACS  No Known Allergies  Patient Measurements: Height: 5' (152.4 cm) Weight: 160 lb (72.6 kg) IBW/kg (Calculated) : 45.5 Heparin Dosing Weight: 61.6 kg  Vital Signs: Temp: 98.4 F (36.9 C) (05/29 2257) Temp Source: Oral (05/29 2257) BP: 109/90 (05/29 2257) Pulse Rate: 88 (05/29 2257)  Labs: Recent Labs    12/04/17 2302  HGB 13.2  HCT 39.4  PLT 319  CREATININE 0.96  TROPONINI <0.03    Estimated Creatinine Clearance: 65.1 mL/min (by C-G formula based on SCr of 0.96 mg/dL).   Medical History: Past Medical History:  Diagnosis Date  . Anxiety   . Cancer [facility]) 1994   cervical  . Coronary artery disease    2/19 PCI/DES x1 to dRCA with LM, LAD disease (planned for possible CABG in near future), normal EF  . Coronary artery disease involving native coronary artery of native heart with angina pectoris ([place])   . Diabetes ([place])   . Hypertension   . Itching   . Myocardial infarction ([place])   . RLS (restless legs syndrome)   . Tobacco use     Medications:  See medication history  Assessment: 47 yo lady to start heparin for CP.  She was not on anticoagulation PTA. Goal of Therapy:  Heparin level 0.3-0.7 units/ml Monitor platelets by anticoagulation protocol: Yes   Plan:  Give 3000 units bolus x 1 and start drip at 850 units/hr Check heparin level in 6 hours Daily HL and CBC while on heparin Monitor for bleeding complications Thanks for allowing pharmacy to be a part of this patient's care.  Excell Seltzer, PharmD Clinical Pharmacist 12/05/2017,12:32 AM

## 2017-12-05 NOTE — Progress Notes (Signed)
PROGRESS NOTE    Melissa Rubio  NAT:[id number] DOB: 1970/08/12 DOA: 12/04/2017 PCP: Timmothy Euler, MD   Brief Narrative:  HPI On 12/05/2017 by Dr. Ivor Costa Melissa Rubio is a 47 y.o. female with medical history significant of hypertension, hyperlipidemia, diabetes mellitus, GERD, depression, anxiety, CAD, STEMI, stent placement, tobacco abuse, who presents with chest pain.  Patient states that she had hx of CAD with stent placement, she is scheduled for CABG next week by Dr. Roxy Manns.  Patient started having chest pain at about 9:20 PM.  Her chest pain is located in substernal area, constant, sharp, 6 out of 10 severity initially, currently 0 out of 10 severity, associated with palpitation, no shortness of breath. Patient has mild dry cough, no fever or chills. Patient has nausea, no vomiting, diarrhea or abdominal pain.  No symptoms of UTI or unilateral weakness.  Assessment & Plan   Patient admitted this morning by Dr. Ivor Costa. See full H&P for details.  Chest pain -Chest pain resolved with 5 nitroglycerin as well as morphine given prior to admission -Patient is scheduled for CABG next week with Dr. Roxy Manns -Troponin currently being cycled and unremarkable -Cardiology consulted and appreciated -Have made Dr. Johnn Hai office aware of patient's admission -Continue morphine, aspirin, Plavix, statin, Coreg -Echocardiogram 09/02/2017, EF 55 to 60%, with hypokinesis of the distal inferior and inferior septal walls -Patient had a catheterization 09/01/2017 which showed three-vessel obstructive CAD, 100% distal RCA.  50% distal left main, 65% ostial LAD, 80% ostial left circumflex.  Patient had successful stenting of the distal RCA with DES  Depression/anxiety -Continue BuSpar, Celexa, Xanax as needed  Hyperlipidemia -Continue statin  Essential hypertension -Continue Coreg, hydralazine as needed  Diabetes mellitus, type II -Hemoglobin A1c was 6.7 on 09/02/2017 -Metformin held -Continue NPH,  insulin sliding scale and CBG monitoring  GERD -Continue PPI  Tobacco abuse -Discussed cessation  -Continue Nicotine patch  DVT Prophylaxis Lovenox  Code Status: Full  Family Communication: None at bedside  Disposition Plan: Observation.  Pending cardiology consultation  Consultants Cardiology Cardiothoracic surgery, Dr. Roxy Manns, made aware of patient's admission  Procedures  None  Antibiotics   Anti-infectives (From admission, onward)   Start     Dose/Rate Route Frequency Ordered Stop   12/05/17 1000  cephALEXin (KEFLEX) capsule 500 mg     500 mg Oral 3 times daily 12/05/17 0250        Subjective:   Melissa Rubio seen and examined today.  No longer complaining of chest pain.  Denies current shortness of breath, abdominal pain, nausea, vomiting, diarrhea or constipation, dizziness or headache. Objective:   Vitals:   12/05/17 0831 12/05/17 0838 12/05/17 1130 12/05/17 1200  BP: (!) 137/58  121/69 122/65  Pulse: 60 67 (!) 58 62  Resp: 10 12 14 16   Temp:      TempSrc:      SpO2: 95% 96% 97% 95%  Weight:      Height:       No intake or output data in the 24 hours ending 12/05/17 1243 Filed Weights   12/04/17 2256  Weight: 72.6 kg (160 lb)    Exam  General: Well developed, well nourished, NAD, appears stated age  HEENT: NCAT, PERRLA, EOMI, Anicteic Sclera, mucous membranes moist.   Neck: Supple, no JVD  Cardiovascular: S1 S2 auscultated, no rubs, murmurs or gallops. Regular rate and rhythm.  Respiratory: Clear to auscultation bilaterally with equal chest rise  Abdomen: Soft, nontender, nondistended, + bowel sounds  Extremities: warm  dry without cyanosis clubbing or edema  Neuro: AAOx3, nonfocal  Skin: Without rashes exudates or nodules  Psych: Normal affect and demeanor with intact judgement and insight   Data Reviewed: I have personally reviewed following labs and imaging studies  CBC: Recent Labs  Lab 12/04/17 2302  WBC 11.8*  NEUTROABS 6.5    HGB 13.2  HCT 39.4  MCV 89.3  PLT 235   Basic Metabolic Panel: Recent Labs  Lab 12/04/17 2302 12/05/17 0852  NA 143  --   K 3.4*  --   CL 110  --   CO2 23  --   GLUCOSE 67  --   BUN 11  --   CREATININE 0.96  --   CALCIUM 9.6  --   MG  --  1.8   GFR: Estimated Creatinine Clearance: 65.1 mL/min (by C-G formula based on SCr of 0.96 mg/dL). Liver Function Tests: Recent Labs  Lab 12/04/17 2302  AST 16  ALT 16  ALKPHOS 79  BILITOT 0.4  PROT 6.8  ALBUMIN 3.5   No results for input(s): LIPASE, AMYLASE in the last 168 hours. No results for input(s): AMMONIA in the last 168 hours. Coagulation Profile: No results for input(s): INR, PROTIME in the last 168 hours. Cardiac Enzymes: Recent Labs  Lab 12/04/17 2302 12/05/17 0316 12/05/17 0852  TROPONINI <0.03 <0.03 <0.03   BNP (last 3 results) No results for input(s): PROBNP in the last 8760 hours. HbA1C: Recent Labs    12/05/17 0338  HGBA1C 7.1*   CBG: Recent Labs  Lab 12/05/17 0731  GLUCAP 70   Lipid Profile: Recent Labs    12/05/17 0338  CHOL 162  HDL 28*  LDLCALC 96  TRIG 191*  CHOLHDL 5.8   Thyroid Function Tests: No results for input(s): TSH, T4TOTAL, FREET4, T3FREE, THYROIDAB in the last 72 hours. Anemia Panel: No results for input(s): VITAMINB12, FOLATE, FERRITIN, TIBC, IRON, RETICCTPCT in the last 72 hours. Urine analysis: No results found for: COLORURINE, APPEARANCEUR, LABSPEC, PHURINE, GLUCOSEU, HGBUR, BILIRUBINUR, KETONESUR, PROTEINUR, UROBILINOGEN, NITRITE, LEUKOCYTESUR Sepsis Labs: @LABRCNTIP (procalcitonin:4,lacticidven:4)  )No results found for this or any previous visit (from the past 240 hour(s)).    Radiology Studies: Dg Chest 2 View  Result Date: 12/04/2017 CLINICAL DATA:  Acute onset of generalized chest pain. EXAM: CHEST - 2 VIEW COMPARISON:  None. FINDINGS: The lungs are well-aerated. Mild peribronchial thickening is noted. There is no evidence of focal opacification, pleural  effusion or pneumothorax. The heart is normal in size; the mediastinal contour is within normal limits. No acute osseous abnormalities are seen. IMPRESSION: Mild peribronchial thickening noted.  Lungs otherwise clear. Electronically Signed   By: Garald Balding M.D.   On: 12/04/2017 23:27     Scheduled Meds: . aspirin  81 mg Oral Daily  . atorvastatin  80 mg Oral q1800  . busPIRone  10 mg Oral TID  . carvedilol  3.125 mg Oral BID WC  . cephALEXin  500 mg Oral TID  . citalopram  40 mg Oral Daily  . clopidogrel  75 mg Oral Daily  . enoxaparin (LOVENOX) injection  40 mg Subcutaneous Q24H  . gabapentin  800 mg Oral QID  . hydrochlorothiazide  12.5 mg Oral Daily  . insulin aspart  0-9 Units Subcutaneous TID WC  . insulin NPH Human  20 Units Subcutaneous BID AC & HS  . nicotine  21 mg Transdermal Daily  . pantoprazole  40 mg Oral Daily   Continuous Infusions:   LOS: 0 days  Time Spent in minutes   45 minutes   Aralyn Nowak D.O. on 12/05/2017 at 12:43 PM  Between 7am to 7pm - Pager - [phone]  After 7pm go to [email or web address] - password [email or web address]  And look for the night coverage person covering for me after hours  [facility] Office  [phone]

## 2017-12-06 ENCOUNTER — Observation Stay (HOSPITAL_BASED_OUTPATIENT_CLINIC_OR_DEPARTMENT_OTHER): Payer: PRIVATE HEALTH INSURANCE

## 2017-12-06 DIAGNOSIS — Z720 Tobacco use: Secondary | ICD-10-CM

## 2017-12-06 DIAGNOSIS — K219 Gastro-esophageal reflux disease without esophagitis: Secondary | ICD-10-CM

## 2017-12-06 DIAGNOSIS — I2510 Atherosclerotic heart disease of native coronary artery without angina pectoris: Secondary | ICD-10-CM

## 2017-12-06 DIAGNOSIS — E119 Type 2 diabetes mellitus without complications: Secondary | ICD-10-CM

## 2017-12-06 DIAGNOSIS — I200 Unstable angina: Secondary | ICD-10-CM

## 2017-12-06 DIAGNOSIS — R079 Chest pain, unspecified: Secondary | ICD-10-CM

## 2017-12-06 DIAGNOSIS — F419 Anxiety disorder, unspecified: Secondary | ICD-10-CM

## 2017-12-06 LAB — GLUCOSE, CAPILLARY
GLUCOSE-CAPILLARY: 73 mg/dL (ref 65–99)
Glucose-Capillary: 195 mg/dL — ABNORMAL HIGH (ref 65–99)
Glucose-Capillary: 195 mg/dL — ABNORMAL HIGH (ref 65–99)
Glucose-Capillary: 169 mg/dL — ABNORMAL HIGH (ref 65–99)

## 2017-12-06 LAB — BASIC METABOLIC PANEL
Anion gap: 6 (ref 5–15)
BUN: 14 mg/dL (ref 6–20)
CO2: 24 mmol/L (ref 22–32)
CREATININE: 0.84 mg/dL (ref 0.44–1.00)
Calcium: 9.1 mg/dL (ref 8.9–10.3)
Chloride: 108 mmol/L (ref 101–111)
GFR calc Af Amer: >60 mL/min (ref >60)
Glucose, Bld: 110 mg/dL — ABNORMAL HIGH (ref 65–99)
POTASSIUM: 3.8 mmol/L (ref 3.5–5.1)
Sodium: 138 mmol/L (ref 135–145)

## 2017-12-06 LAB — HEPARIN LEVEL (UNFRACTIONATED): Heparin Unfractionated: 0.2 IU/mL — ABNORMAL LOW (ref 0.30–0.70)

## 2017-12-06 LAB — CBC
HEMATOCRIT: 39.2 % (ref 36.0–46.0)
Hemoglobin: 12.5 g/dL (ref 12.0–15.0)
MCH: 28.9 pg (ref 26.0–34.0)
MCHC: 31.9 g/dL (ref 30.0–36.0)
MCV: 90.5 fL (ref 78.0–100.0)
Platelets: 263 10*3/uL (ref 150–400)
RBC: 4.33 MIL/uL (ref 3.87–5.11)
RDW: 14.5 % (ref 11.5–15.5)
WBC: 8.7 10*3/uL (ref 4.0–10.5)

## 2017-12-06 LAB — ECHOCARDIOGRAM COMPLETE
Height: 60 in
WEIGHTICAEL: 2737.23 [oz_av]

## 2017-12-06 MED ORDER — HEPARIN BOLUS VIA INFUSION
3000.0000 [IU] | Freq: Once | INTRAVENOUS | Status: AC
  Administered 2017-12-06: 3000 [IU] via INTRAVENOUS
  Filled 2017-12-06: qty 3000

## 2017-12-06 MED ORDER — ISOSORBIDE MONONITRATE ER 30 MG PO TB24: 15.0000 mg | ORAL_TABLET | Freq: Every day | ORAL | 0 refills | Status: DC

## 2017-12-06 MED ORDER — EZETIMIBE 10 MG PO TABS: 10.0000 mg | ORAL_TABLET | Freq: Every day | ORAL | Status: DC

## 2017-12-06 MED ORDER — NICOTINE 21 MG/24HR TD PT24: 21.0000 mg | MEDICATED_PATCH | Freq: Every day | TRANSDERMAL | 0 refills | Status: DC

## 2017-12-06 MED ORDER — ISOSORBIDE MONONITRATE ER 30 MG PO TB24: 15.0000 mg | ORAL_TABLET | Freq: Every day | ORAL | Status: DC

## 2017-12-06 MED ORDER — EZETIMIBE 10 MG PO TABS: 10.0000 mg | ORAL_TABLET | Freq: Every day | ORAL | 0 refills | Status: DC

## 2017-12-06 MED FILL — EZETIMIBE 10 MG TABLET: 10 | 30 days supply | Qty: 30 | Fill #0

## 2017-12-06 NOTE — Progress Notes (Addendum)
Progress Note  Patient Name: Melissa Rubio Date of Encounter: 12/06/2017  Primary Cardiologist: Carlyle Dolly, MD   Subjective   She denies any further CP or SOB  Inpatient Medications    Scheduled Meds: . aspirin  81 mg Oral Daily  . atorvastatin  80 mg Oral q1800  . busPIRone  10 mg Oral TID  . carvedilol  3.125 mg Oral BID WC  . cephALEXin  500 mg Oral TID  . citalopram  40 mg Oral Daily  . gabapentin  800 mg Oral QID  . hydrochlorothiazide  12.5 mg Oral Daily  . insulin aspart  0-9 Units Subcutaneous TID WC  . insulin NPH Human  20 Units Subcutaneous BID AC & HS  . isosorbide mononitrate  30 mg Oral Daily  . nicotine  21 mg Transdermal Daily  . pantoprazole  40 mg Oral Daily   Continuous Infusions: . heparin 1,100 Units/hr (12/06/17 0455)   PRN Meds: acetaminophen, ALPRAZolam, cyclobenzaprine, GRX ANALGESIC BALM, hydrALAZINE, hydrOXYzine, morphine injection, nitroGLYCERIN, ondansetron (ZOFRAN) IV, zolpidem   Vital Signs    Vitals:   12/05/17 1412 12/05/17 2019 12/06/17 0425 12/06/17 0455  BP: 121/76 107/70 (!) 89/54 103/70  Pulse: (!) 55 65 (!) 57   Resp:      Temp: 98.1 F (36.7 C) 98.5 F (36.9 C) 98.1 F (36.7 C)   TempSrc: Oral Oral Oral   SpO2: 97% 95% 93%   Weight:   171 lb 1.2 oz (77.6 kg)   Height:        Intake/Output Summary (Last 24 hours) at 12/06/2017 0916 Last data filed at 12/06/2017 0042 Gross per 24 hour  Intake 450.45 ml  Output -  Net 450.45 ml   Filed Weights   12/04/17 2256 12/06/17 0425  Weight: 160 lb (72.6 kg) 171 lb 1.2 oz (77.6 kg)    Telemetry    NSR - Personally Reviewed  ECG    No new EKG to review - Personally Reviewed  Physical Exam   GEN: No acute distress.   Neck: No JVD Cardiac: RRR, no murmurs, rubs, or gallops.  Respiratory: Clear to auscultation bilaterally. GI: Soft, nontender, non-distended  MS: No edema; No deformity. Neuro:  Nonfocal  Psych: Normal affect   Labs    Chemistry Recent Labs   Lab 12/04/17 2302 12/06/17 0321  NA 143 138  K 3.4* 3.8  CL 110 108  CO2 23 24  GLUCOSE 67 110*  BUN 11 14  CREATININE 0.96 0.84  CALCIUM 9.6 9.1  PROT 6.8  --   ALBUMIN 3.5  --   AST 16  --   ALT 16  --   ALKPHOS 79  --   BILITOT 0.4  --   GFRNONAA >60 >60  GFRAA >60 >60  ANIONGAP 10 6     Hematology Recent Labs  Lab 12/04/17 2302 12/06/17 0321  WBC 11.8* 8.7  RBC 4.41 4.33  HGB 13.2 12.5  HCT 39.4 39.2  MCV 89.3 90.5  MCH 29.9 28.9  MCHC 33.5 31.9  RDW 14.5 14.5  PLT 319 263    Cardiac Enzymes Recent Labs  Lab 12/04/17 2302 12/05/17 0316 12/05/17 0852 12/05/17 1510  TROPONINI <0.03 <0.03 <0.03 <0.03   No results for input(s): TROPIPOC in the last 168 hours.   BNP Recent Labs  Lab 12/04/17 2302  BNP 17.7     DDimer No results for input(s): DDIMER in the last 168 hours.   Radiology    Dg Chest 2  View  Result Date: 12/04/2017 CLINICAL DATA:  Acute onset of generalized chest pain. EXAM: CHEST - 2 VIEW COMPARISON:  None. FINDINGS: The lungs are well-aerated. Mild peribronchial thickening is noted. There is no evidence of focal opacification, pleural effusion or pneumothorax. The heart is normal in size; the mediastinal contour is within normal limits. No acute osseous abnormalities are seen. IMPRESSION: Mild peribronchial thickening noted.  Lungs otherwise clear. Electronically Signed   By: Garald Balding M.D.   On: 12/04/2017 23:27    Cardiac Studies   2D echo 09/02/2017 Study Conclusions  - Left ventricle: The cavity size was normal. Wall thickness was   normal. Systolic function was normal. The estimated ejection   fraction was in the range of 55% to 60% with hypokinesis   hypokinesis of the distal inferior, inferoseptal walls.  Cardiac Cath 09/01/2017 Conclusion     Dist RCA lesion is 100% stenosed.  Prox RCA to Mid RCA lesion is 25% stenosed.  Dist LM-2 lesion is 50% stenosed.  Dist LM to Ost LAD lesion is 65% stenosed.  Ost  Cx to Prox Cx lesion is 80% stenosed.  Post intervention, there is a 0% residual stenosis.  A drug-eluting stent was successfully placed using a STENT SYNERGY DES 2.5X32.  The left ventricular systolic function is normal.  LV end diastolic pressure is mildly elevated.  The left ventricular ejection fraction is 55-65% by visual estimate.   1. 3 vessel obstructive CAD. 100% distal RCA is the culprit vessel. She also has 50% distal left main, 65% ostial LAD, and 80% ostial LCx 2. Inferior wall motion abnormality with preserved LV systolic function 3. Mildly elevated LVEDP 4. Successful stenting of the distal RCA with DES  Plan: DAPT for one year. Will need to review films with interventional colleagues to decide management of residual disease in LCA. For now will treat medically. It is not well suited for PCI. Will need to decide whether CABG is needed after she has healed from MI.      Patient Profile     47 y.o. female with a history of hypertension, type 2 diabetes mellitus, hyperlipidemia, ongoing tobacco abuse and ASCHD with recent inferior STEMI in February 2019 treated with PCI of the RCA but with residual disease of 50% distal left main, 65% ostial LAD and 80% ostial left circumflex.  She has been on DAPT since then.  It was felt that her left coronary artery was not well suited for PCI.    She was seen by CVTS surgery and CABG was planned for 12/12/2017 but she developed substernal chest pain it was sharp and shooting and similar to her prior chest pain with her MI.  This started last evening while sitting down.  Assessment & Plan    Unstable angina in pt with Coronary artery disease -Status post inferior STEMI in 08/2017 with DES to the RCA and residual distal left main, ostial LAD and left circumflex disease noted with possible CABG in the future once healed from her MI.  She was evaluated by Dr. Roxy Manns and has been scheduled for CABG on 12/12/2017 -Patient developed chest pain at  rest with mild shortness of breath and no other associated symptoms.  Resolved after sublingual nitroglycerin x5 and morphine.  No further chest pain since that time. -EKG without ischemic changes -Troponins negative x3.  BNP 17.7. -No significant findings on chest x-ray -No objective evidence of myocardial ischemia.  Patient's symptoms are similar to what she experienced with her MI in  February but much less severe.  They were responsive to nitroglycerin. -she has been up ambulating in the room and hall without any further angina since starting Imdur. -will stop IV Heparin gtt -ok to discharge home on Imdur.  Instructed her to call if she has recurrent CP and if not improved after SL NTG to call EMS. -plan is for CABG next Thursday after Plavix washout.   CAD -Status post inferior STEMI in 08/2017 with DES to the RCA and residual distal left main, ostial LAD and left circumflex disease, planned for CABG on 12/12/17. -Medical therapy includes aspirin, statin, BB and long acting nitrate but decrease imdur to 15mg  daily due to soft BP -Plavix has been stopped to allow washout for CABG  Palpitations -Has been having palpitations daily, fluttering for up to a few minutes. Occasionally with mild shortness of breath but no dizziness or syncope.  -Outpatient 48-hour Holter monitor revealed normal sinus rhythm with sinus tachycardia and no sustained irregular rhythms. -Monitor on telemetry.   HLD -FLP in 08/2017 showed total cholesterol 217, triglycerides 222, HDL 26, and LDL 147.Was started on Atorvastatin 80mg  daily at that time.  - LDL of 96 this admit -add Zetia 10mg  daily -repeat FLP and ALT in 6 weeks  IDDM - HgbA1c at 6.7 in 08/2017, A1c is 7.1 today -SSI while hospitalized. Management per IM.  Tobacco Use -She is only smoking a few cigarettes per week, none since last Tursday. Complete cessation advised.  HTN -BP well controlled on exam and actually on the soft side.   -stop  diuretic and continue low dose BB     For questions or updates, please contact [place] Please consult [email or web address] for contact info under Cardiology/STEMI.      Signed, Fransico Him, MD  12/06/2017, 9:16 AM

## 2017-12-06 NOTE — Progress Notes (Signed)
  Echocardiogram 2D Echocardiogram has been performed.  Jennette Dubin 12/06/2017, 11:38 AM

## 2017-12-06 NOTE — Care Management Note (Signed)
Case Management Note  Patient Details  Name: Melissa Rubio MRN: [id number] Date of Birth: 1971-03-22  Subjective/Objective: Pt presented for Chest Pain -plan to return for CABG on 12-12-17. PTA from home- no HH needs identified. Pt has PCP- no insurance and problems affording medications. FC did speak with the patient.                     Action/Plan: CM called [facility] and Zetia is $78.00- pt is unable to afford. Nicotine patches $52.00 and unable to afford pt states she will not purchase. CM then called the [facility] and cost will be $9.00 for [place] received information to call [facility] in [place] to get Rx switched to [facility]. Pharmacy to call CM once medication is filled. No further needs from CM at this time.   Expected Discharge Date:  12/06/17               Expected Discharge Plan:  Home/Self Care  In-House Referral:  NA  Discharge planning Services  CM Consult, Medication Assistance  Post Acute Care Choice:  NA Choice offered to:  NA  DME Arranged:  N/A DME Agency:  NA  HH Arranged:  NA HH Agency:  NA  Status of Service:  Completed, signed off  If discussed at [place] of Stay Meetings, dates discussed:    Additional Comments:  Bethena Roys, RN 12/06/2017, 12:19 PM

## 2017-12-06 NOTE — Discharge Summary (Signed)
Physician Discharge Summary  Melissa Rubio DTO:[id number] DOB: 07/19/1970 DOA: 12/04/2017  PCP: Timmothy Euler, MD  Admit date: 12/04/2017 Discharge date: 12/06/2017  Time spent: 45 minutes  Recommendations for Outpatient Follow-up:  Patient will be discharged to home.  Patient will need to follow up with primary care provider within one week of discharge.  Plan is for CABG on 12/12/2017 with Dr. Roxy Manns. Patient should continue medications as prescribed.  Patient should follow a heart healthy/carb modified diet.  Discontinue plavix.  Discharge Diagnoses:  Chest pain Depression/anxiety Hyperlipidemia Essential hypertension Diabetes mellitus, type II GERD Tobacco abuse Hypokalemia  Discharge Condition: Stable  Diet recommendation: heart healthy/carb modified  Filed Weights   12/04/17 2256 12/06/17 0425  Weight: 72.6 kg (160 lb) 77.6 kg (171 lb 1.2 oz)    History of present illness:  On 12/05/2017 by Dr. Clide Cliff a 47 y.o.femalewith medical history significant ofhypertension, hyperlipidemia, diabetes mellitus, GERD, depression, anxiety, CAD, STEMI, stent placement, tobacco abuse, who presents with chest pain.  Patient states that she hadhx ofCAD with stent placement, she is scheduled for CABG next week by Dr.Owen.Patient startedhaving chest pain at about 9:20 PM.Her chest pain is located in substernal area, constant, sharp, 6 out of 10 severity initially, currently 0 out of 10 severity, associated with palpitation,no shortness of breath. Patient has mild dry cough, no fever or chills. Patient has nausea, no vomiting, diarrhea or abdominal pain. No symptoms of UTI or unilateral weakness.  Hospital Course:  Chest pain/Unstable angina/ CAD -Chest pain resolved with 5 nitroglycerin as well as morphine given prior to admission -Patient is scheduled for CABG next week with Dr. Roxy Manns -Troponin currently being cycled and unremarkable -was given morphine,  aspirin, statin, Coreg (Plavix held for upcoming CABG) -Echocardiogram 09/02/2017, EF 55 to 60%, with hypokinesis of the distal inferior and inferior septal walls -was placed on IV heparin temporarily -Patient had a catheterization 09/01/2017 which showed three-vessel obstructive CAD, 100% distal RCA.  50% distal left main, 65% ostial LAD, 80% ostial left circumflex.  Patient had successful stenting of the distal RCA with DES -Cardiology consulted and appreciated, recommended Imdur, if no further chest pain, may discharge home -cardiothoracic surgery, Dr. Roxy Manns, consulted and appreciated- will plan for CABG next week; patient may be discharged home -patient instructed that if her pain returns, use NTG, if no improvement, call EMS and to return to the ER  Depression/anxiety -Continue BuSpar, Celexa, Xanax as needed  Hyperlipidemia -Continue statin -Lipid panel: -Zetia added -repeat lipid panel and ALT in 6 weeks  Essential hypertension -Continue Coreg -diuretic discontinued per cardiology  Diabetes mellitus, type II -Hemoglobin A1c was 6.7 on 09/02/2017 -Metformin held -Continue NPH, insulin sliding scale and CBG monitoring  GERD -Continue PPI  Tobacco abuse -Discussed cessation  -Continue Nicotine patch  Hypokalemia -Placed, will continue to monitor BMP  Procedures: None  Consultations: Cardiology Cardiothoracic surgery  Discharge Exam: Vitals:   12/06/17 0425 12/06/17 0455  BP: (!) 89/54 103/70  Pulse: (!) 57   Resp:    Temp: 98.1 F (36.7 C)   SpO2: 93%    Patient feeling better today.  Does have slight headache.  Denies current chest pain, shortness of breath.  Denies abdominal pain, nausea or vomiting, diarrhea or constipation.   General: Well developed, well nourished, NAD, appears stated age  HEENT: NCAT, mucous membranes moist.  Neck: Supple  Cardiovascular: S1 S2 auscultated, no rubs, murmurs or gallops. Regular rate and rhythm.  Respiratory:  Clear to auscultation bilaterally  with equal chest rise  Abdomen: Soft, nontender, nondistended, + bowel sounds  Extremities: warm dry without cyanosis clubbing or edema  Neuro: AAOx3, nonfocal  Skin: Without rashes exudates or nodules  Psych: Normal affect and demeanor with intact judgement and insight  Discharge Instructions Discharge Instructions    Discharge instructions   Complete by:  As directed    Patient will be discharged to home.  Patient will need to follow up with primary care provider within one week of discharge.  Plan is for CABG on 12/12/2017 with Dr. Roxy Manns. Patient should continue medications as prescribed.  Patient should follow a heart healthy/carb modified diet.  Discontinue plavix.     Allergies as of 12/06/2017   No Known Allergies     Medication List    STOP taking these medications   clopidogrel 75 MG tablet Commonly known as:  PLAVIX   hydrochlorothiazide 12.5 MG capsule Commonly known as:  MICROZIDE   ibuprofen 800 MG tablet Commonly known as:  ADVIL,MOTRIN     TAKE these medications   acetaminophen 325 MG tablet Commonly known as:  TYLENOL Take 650 mg by mouth 2 (two) times daily as needed for moderate pain or headache.   ALPRAZolam 0.5 MG tablet Commonly known as:  XANAX Take 1 tablet (0.5 mg total) by mouth 3 (three) times daily as needed for anxiety.   aspirin 81 MG chewable tablet Chew 1 tablet (81 mg total) by mouth daily.   atorvastatin 80 MG tablet Commonly known as:  LIPITOR TAKE 1 TABLET BY MOUTH ONCE DAILY AT  6PM   busPIRone 10 MG tablet Commonly known as:  BUSPAR Take 1 tablet (10 mg total) by mouth 3 (three) times daily.   carvedilol 3.125 MG tablet Commonly known as:  COREG Take 1 tablet (3.125 mg total) by mouth 2 (two) times daily with a meal.   cephALEXin 500 MG capsule Commonly known as:  KEFLEX Take 500 mg by mouth 3 (three) times daily.   citalopram 40 MG tablet Commonly known as:  CELEXA TAKE 1 TABLET BY  MOUTH ONCE DAILY   cyclobenzaprine 10 MG tablet Commonly known as:  FLEXERIL Take 1 tablet (10 mg total) by mouth 3 (three) times daily as needed for muscle spasms.   ezetimibe 10 MG tablet Commonly known as:  ZETIA Take 1 tablet (10 mg total) by mouth daily.   gabapentin 800 MG tablet Commonly known as:  NEURONTIN Take 1 tablet (800 mg total) by mouth 4 (four) times daily.   hydrOXYzine 50 MG tablet Commonly known as:  ATARAX/VISTARIL Take 1 tablet (50 mg total) by mouth every 8 (eight) hours as needed. What changed:  reasons to take this   ICY HOT EX Apply 1 application topically daily as needed (back pain).   insulin NPH Human 100 UNIT/ML injection Commonly known as:  HUMULIN N,NOVOLIN N Inject 0.4 mLs (40 Units total) 2 (two) times daily before a meal into the skin. What changed:    how much to take  when to take this  additional instructions   INSULIN SYRINGE .5CC/29G 29G X 1/2" 0.5 ML Misc 1 Syringe by Does not apply route 2 (two) times daily.   isosorbide mononitrate 30 MG 24 hr tablet Commonly known as:  IMDUR Take 0.5 tablets (15 mg total) by mouth daily. Start taking on:  12/07/2017   metFORMIN 1000 MG tablet Commonly known as:  GLUCOPHAGE Take 1 tablet (1,000 mg total) by mouth 2 (two) times daily with a meal.  nicotine 21 mg/24hr patch Commonly known as:  NICODERM CQ - dosed in mg/24 hours Place 1 patch (21 mg total) onto the skin daily. Start taking on:  12/07/2017   nitroGLYCERIN 0.4 MG SL tablet Commonly known as:  NITROSTAT Place 1 tablet (0.4 mg total) under the tongue every 5 (five) minutes as needed. What changed:  reasons to take this   omeprazole 20 MG capsule Commonly known as:  PRILOSEC Take 20 mg by mouth daily.      No Known Allergies    The results of significant diagnostics from this hospitalization (including imaging, microbiology, ancillary and laboratory) are listed below for reference.    Significant Diagnostic  Studies: Dg Chest 2 View  Result Date: 12/04/2017 CLINICAL DATA:  Acute onset of generalized chest pain. EXAM: CHEST - 2 VIEW COMPARISON:  None. FINDINGS: The lungs are well-aerated. Mild peribronchial thickening is noted. There is no evidence of focal opacification, pleural effusion or pneumothorax. The heart is normal in size; the mediastinal contour is within normal limits. No acute osseous abnormalities are seen. IMPRESSION: Mild peribronchial thickening noted.  Lungs otherwise clear. Electronically Signed   By: Garald Balding M.D.   On: 12/04/2017 23:27    Microbiology: No results found for this or any previous visit (from the past 240 hour(s)).   Labs: Basic Metabolic Panel: Recent Labs  Lab 12/04/17 2302 12/05/17 0852 12/06/17 0321  NA 143  --  138  K 3.4*  --  3.8  CL 110  --  108  CO2 23  --  24  GLUCOSE 67  --  110*  BUN 11  --  14  CREATININE 0.96  --  0.84  CALCIUM 9.6  --  9.1  MG  --  1.8  --    Liver Function Tests: Recent Labs  Lab 12/04/17 2302  AST 16  ALT 16  ALKPHOS 79  BILITOT 0.4  PROT 6.8  ALBUMIN 3.5   No results for input(s): LIPASE, AMYLASE in the last 168 hours. No results for input(s): AMMONIA in the last 168 hours. CBC: Recent Labs  Lab 12/04/17 2302 12/06/17 0321  WBC 11.8* 8.7  NEUTROABS 6.5  --   HGB 13.2 12.5  HCT 39.4 39.2  MCV 89.3 90.5  PLT 319 263   Cardiac Enzymes: Recent Labs  Lab 12/04/17 2302 12/05/17 0316 12/05/17 0852 12/05/17 1510  TROPONINI <0.03 <0.03 <0.03 <0.03   BNP: BNP (last 3 results) Recent Labs    12/04/17 2302  BNP 17.7    ProBNP (last 3 results) No results for input(s): PROBNP in the last 8760 hours.  CBG: Recent Labs  Lab 12/05/17 1417 12/05/17 1634 12/05/17 2022 12/06/17 0735 12/06/17 1005  GLUCAP 78 75 196* 73 195*       Signed:  Bekki Tavenner  [facility] 12/06/2017, 11:20 AM

## 2017-12-06 NOTE — Discharge Summary (Signed)
Discharge Summary    Patient ID: Melissa Rubio,  MRN: [id number], DOB/AGE: August 24, 1970 47 y.o.  Admit date: 12/04/2017 Discharge date: 12/06/2017  Primary Care Provider: Timmothy Euler Primary Cardiologist: Carlyle Dolly, MD  Discharge Diagnoses    Principal Problem:   Chest pain Active Problems:   Anxiety   GERD (gastroesophageal reflux disease)   Depression   Tobacco use   Diabetes mellitus without complication ([place])   Coronary artery disease due to lipid rich plaque   Hyperlipidemia with target LDL less than 70   Allergies No Known Allergies  Diagnostic Studies/Procedures    Echocardiogram being done, Results pending _____________   History of Present Illness     Melissa Rubio is a 47 y.o. female with a hx of hypertension, type 2 diabetes, hyperlipidemia, long-standing tobacco abuse, obesity, GERD, strong family history of coronary artery disease and inferior STEMI with RCA stent in 08/2017 who is being for the evaluation of chest pain.  Melissa Rubio had an acute inferior wall ST segment elevation myocardial infarction in 08/2017 which was treated with PCI and stenting of the right coronary artery which was felt to be the culprit. She also had residual 50% stenosis of the distal left main, 65% ostial LAD and 80% ostial left circumflex disease.  She was started on dual antiplatelet therapy and at the time it was decided to treat her medically as her left coronary artery was not well suited to PCI.  It was noted that the patient may benefit from coronary artery bypass grafting due to her left main stenosis once healed from her MI. She was subsequently seen by cardiothoracic surgeon, Dr. Roxy Manns, and arranged for CABG on 12/12/17.  Melissa Rubio developed substernal sharp, shooting chest pain while watching television 2 nights ago.  This pain was similar to what she had with her MI but much less severe.  It was nonradiating and accompanied by mild dyspnea but no lightheadedness, nausea,  diaphoresis.  She took sublingual nitroglycerin without relief and called 911.  She was given 3 sublingual nitroglycerin and morphine in the ambulance with resolution of her pain.  She is admitted for further evaluation of the chest pain.   Hospital Course     Consultants: None  Troponins were negative x3 and BNP was 17.7.  She had no significant findings on chest x-ray.  EKG was without ischemic changes.  There was no objective evidence of myocardial ischemia.  She was started on IV heparin and long-acting nitrate and monitored overnight with no recurrence of chest pain.  Her Plavix is discontinued as directed by cardiothoracic surgery with plans for bypass surgery on 12/12/2017.   She has been up walking in the halls today without any further angina since starting Imdur.  Her blood pressure is soft we will discharge her on low-dose Imdur.  She is instructed to call if she has recurrent chest pain and if not improved after sublingual nitroglycerin to call EMS.  She is continued on her aspirin, statin, beta-blocker.  She is on atorvastatin 80 mg having been started in February after her MI.  Her current LDL is 96 which is not at goal.  We will add Zetia 10 mg daily.  She needs a repeat fasting lipid panel and ALT in 6 weeks.  She continues to smoke although sparingly.  She has been advised on complete cessation.  Due to her soft blood pressure we will stop her diuretic.   Patient has been seen by Dr. Radford Pax today  and deemed ready for discharge home. All follow up appointments have been scheduled. Discharge medications are listed below. _____________  Discharge Vitals Blood pressure 103/70, pulse (!) 57, temperature 98.1 F (36.7 C), temperature source Oral, resp. rate (!) 23, height 5' (1.524 m), weight 171 lb 1.2 oz (77.6 kg), SpO2 93 %.  Filed Weights   12/04/17 2256 12/06/17 0425  Weight: 160 lb (72.6 kg) 171 lb 1.2 oz (77.6 kg)    Labs & Radiologic Studies    CBC Recent Labs     12/04/17 2302 12/06/17 0321  WBC 11.8* 8.7  NEUTROABS 6.5  --   HGB 13.2 12.5  HCT 39.4 39.2  MCV 89.3 90.5  PLT 319 347   Basic Metabolic Panel Recent Labs    12/04/17 2302 12/05/17 0852 12/06/17 0321  NA 143  --  138  K 3.4*  --  3.8  CL 110  --  108  CO2 23  --  24  GLUCOSE 67  --  110*  BUN 11  --  14  CREATININE 0.96  --  0.84  CALCIUM 9.6  --  9.1  MG  --  1.8  --    Liver Function Tests Recent Labs    12/04/17 2302  AST 16  ALT 16  ALKPHOS 79  BILITOT 0.4  PROT 6.8  ALBUMIN 3.5   No results for input(s): LIPASE, AMYLASE in the last 72 hours. Cardiac Enzymes Recent Labs    12/05/17 0316 12/05/17 0852 12/05/17 1510  TROPONINI <0.03 <0.03 <0.03   BNP Invalid input(s): POCBNP D-Dimer No results for input(s): DDIMER in the last 72 hours. Hemoglobin A1C Recent Labs    12/05/17 0338  HGBA1C 7.1*   Fasting Lipid Panel Recent Labs    12/05/17 0338  CHOL 162  HDL 28*  LDLCALC 96  TRIG 191*  CHOLHDL 5.8   Thyroid Function Tests No results for input(s): TSH, T4TOTAL, T3FREE, THYROIDAB in the last 72 hours.  Invalid input(s): FREET3 _____________  Dg Chest 2 View  Result Date: 12/04/2017 CLINICAL DATA:  Acute onset of generalized chest pain. EXAM: CHEST - 2 VIEW COMPARISON:  None. FINDINGS: The lungs are well-aerated. Mild peribronchial thickening is noted. There is no evidence of focal opacification, pleural effusion or pneumothorax. The heart is normal in size; the mediastinal contour is within normal limits. No acute osseous abnormalities are seen. IMPRESSION: Mild peribronchial thickening noted.  Lungs otherwise clear. Electronically Signed   By: Garald Balding M.D.   On: 12/04/2017 23:27   Disposition   Pt is being discharged home today in good condition.  Follow-up Plans & Appointments    Follow-up Information    Branch, Alphonse Guild, MD Follow up.   Specialty:  Cardiology Why:  You have an appointment with Dr. Harl Bowie on 12/27/17 at  3:00.  Continue with plans for surgery on 12/12/17.  Contact information: [place] [address] [phone]          Discharge Instructions    Diet - low sodium heart healthy   Complete by:  As directed    Discharge instructions   Complete by:  As directed    Patient will be discharged to home.  Patient will need to follow up with primary care provider within one week of discharge.  Plan is for CABG on 12/12/2017 with Dr. Roxy Manns. Patient should continue medications as prescribed.  Patient should follow a heart healthy/carb modified diet.  Discontinue plavix.   Discharge  instructions   Complete by:  As directed    Use Nitroglycerin as needed for chest pain.  If a single episode of chest pain is not relieved by one tablet, the patient will try another within 5 minutes; and another in 5 minutes if needed.  If this doesn't relieve the pain, the patient is instructed to call 911 for transportation to an emergency department.   Increase activity slowly   Complete by:  As directed       Discharge Medications   Allergies as of 12/06/2017   No Known Allergies     Medication List    STOP taking these medications   clopidogrel 75 MG tablet Commonly known as:  PLAVIX   hydrochlorothiazide 12.5 MG capsule Commonly known as:  MICROZIDE   ibuprofen 800 MG tablet Commonly known as:  ADVIL,MOTRIN     TAKE these medications   acetaminophen 325 MG tablet Commonly known as:  TYLENOL Take 650 mg by mouth 2 (two) times daily as needed for moderate pain or headache.   ALPRAZolam 0.5 MG tablet Commonly known as:  XANAX Take 1 tablet (0.5 mg total) by mouth 3 (three) times daily as needed for anxiety.   aspirin 81 MG chewable tablet Chew 1 tablet (81 mg total) by mouth daily.   atorvastatin 80 MG tablet Commonly known as:  LIPITOR TAKE 1 TABLET BY MOUTH ONCE DAILY AT  6PM   busPIRone 10 MG tablet Commonly known as:  BUSPAR Take 1 tablet (10 mg total) by mouth 3  (three) times daily.   carvedilol 3.125 MG tablet Commonly known as:  COREG Take 1 tablet (3.125 mg total) by mouth 2 (two) times daily with a meal.   cephALEXin 500 MG capsule Commonly known as:  KEFLEX Take 500 mg by mouth 3 (three) times daily.   citalopram 40 MG tablet Commonly known as:  CELEXA TAKE 1 TABLET BY MOUTH ONCE DAILY   cyclobenzaprine 10 MG tablet Commonly known as:  FLEXERIL Take 1 tablet (10 mg total) by mouth 3 (three) times daily as needed for muscle spasms.   ezetimibe 10 MG tablet Commonly known as:  ZETIA Take 1 tablet (10 mg total) by mouth daily.   gabapentin 800 MG tablet Commonly known as:  NEURONTIN Take 1 tablet (800 mg total) by mouth 4 (four) times daily.   hydrOXYzine 50 MG tablet Commonly known as:  ATARAX/VISTARIL Take 1 tablet (50 mg total) by mouth every 8 (eight) hours as needed. What changed:  reasons to take this   ICY HOT EX Apply 1 application topically daily as needed (back pain).   insulin NPH Human 100 UNIT/ML injection Commonly known as:  HUMULIN N,NOVOLIN N Inject 0.4 mLs (40 Units total) 2 (two) times daily before a meal into the skin. What changed:    how much to take  when to take this  additional instructions   INSULIN SYRINGE .5CC/29G 29G X 1/2" 0.5 ML Misc 1 Syringe by Does not apply route 2 (two) times daily.   isosorbide mononitrate 30 MG 24 hr tablet Commonly known as:  IMDUR Take 0.5 tablets (15 mg total) by mouth daily. Start taking on:  12/07/2017   metFORMIN 1000 MG tablet Commonly known as:  GLUCOPHAGE Take 1 tablet (1,000 mg total) by mouth 2 (two) times daily with a meal.   nicotine 21 mg/24hr patch Commonly known as:  NICODERM CQ - dosed in mg/24 hours Place 1 patch (21 mg total) onto the skin daily. Start taking  on:  12/07/2017   nitroGLYCERIN 0.4 MG SL tablet Commonly known as:  NITROSTAT Place 1 tablet (0.4 mg total) under the tongue every 5 (five) minutes as needed. What changed:  reasons  to take this   omeprazole 20 MG capsule Commonly known as:  PRILOSEC Take 20 mg by mouth daily.        Outstanding Labs/Studies    fasting lipid panel and ALT in 6 weeks.  Duration of Discharge Encounter   Greater than 30 minutes including physician time.  Signed, Luna Fuse, NP 12/06/2017, 11:39 AM

## 2017-12-06 NOTE — Plan of Care (Signed)
  Problem: Health Behavior/Discharge Planning: Goal: Ability to manage health-related needs will improve Outcome: Completed/Met   Problem: Clinical Measurements: Goal: Ability to maintain clinical measurements within normal limits will improve Outcome: Completed/Met Goal: Will remain free from infection Outcome: Completed/Met Goal: Diagnostic test results will improve Outcome: Completed/Met Goal: Respiratory complications will improve Outcome: Completed/Met Goal: Cardiovascular complication will be avoided Outcome: Completed/Met   Problem: Nutrition: Goal: Adequate nutrition will be maintained Outcome: Completed/Met   Problem: Coping: Goal: Level of anxiety will decrease Outcome: Completed/Met   Problem: Elimination: Goal: Will not experience complications related to bowel motility Outcome: Completed/Met Goal: Will not experience complications related to urinary retention Outcome: Completed/Met   Problem: Pain Managment: Goal: General experience of comfort will improve Outcome: Completed/Met   Problem: Safety: Goal: Ability to remain free from injury will improve Outcome: Completed/Met   Problem: Skin Integrity: Goal: Risk for impaired skin integrity will decrease Outcome: Completed/Met

## 2017-12-06 NOTE — Progress Notes (Signed)
      [address]       [place], [address]             [phone]     CARDIOTHORACIC SURGERY PROGRESS NOTE  Subjective: Feels well.  No chest pain since admission  Objective: Vital signs in last 24 hours: Temp:  [98.1 F (36.7 C)-98.5 F (36.9 C)] 98.1 F (36.7 C) (05/31 0425) Pulse Rate:  [55-65] 57 (05/31 0425) Cardiac Rhythm: Sinus bradycardia (05/31 0700) Resp:  [13-23] 23 (05/30 1334) BP: (89-122)/(54-76) 103/70 (05/31 0455) SpO2:  [93 %-100 %] 93 % (05/31 0425) Weight:  [171 lb 1.2 oz (77.6 kg)] 171 lb 1.2 oz (77.6 kg) (05/31 0425)  Physical Exam:  Rhythm:   sinus  Breath sounds: clear  Heart sounds:  RRR  Incisions:  n/a  Abdomen:  soft  Extremities:  warm   Intake/Output from previous day: 05/30 0701 - 05/31 0700 In: 450.5 [P.O.:360; I.V.:90.5] Out: -  Intake/Output this shift: No intake/output data recorded.  Lab Results: Recent Labs    12/04/17 2302 12/06/17 0321  WBC 11.8* 8.7  HGB 13.2 12.5  HCT 39.4 39.2  PLT 319 263   BMET:  Recent Labs    12/04/17 2302 12/06/17 0321  NA 143 138  K 3.4* 3.8  CL 110 108  CO2 23 24  GLUCOSE 67 110*  BUN 11 14  CREATININE 0.96 0.84  CALCIUM 9.6 9.1    CBG (last 3)  Recent Labs    12/05/17 1634 12/05/17 2022 12/06/17 0735  GLUCAP 75 196* 73   PT/INR:  No results for input(s): LABPROT, INR in the last 72 hours.  CXR:  CHEST - 2 VIEW  COMPARISON:  None.  FINDINGS: The lungs are well-aerated. Mild peribronchial thickening is noted. There is no evidence of focal opacification, pleural effusion or pneumothorax.  The heart is normal in size; the mediastinal contour is within normal limits. No acute osseous abnormalities are seen.  IMPRESSION: Mild peribronchial thickening noted.  Lungs otherwise clear.   Electronically Signed   By: Garald Balding M.D.   On: 12/04/2017 23:27  Assessment/Plan:  Patient well known to me from recent outpatient consultation.   Events of last 24 hours and plans outlined by Dr Radford Pax reviewed.  Nothing further to add other than we will need to consider repeat cath and/or empiric grafting of RCA given the patient's recurrent symptoms, unless Sx's were felt to be non-cardiac in origin.  I spent in excess of 15 minutes during the conduct of this hospital encounter and >50% of this time involved direct face-to-face encounter with the patient for counseling and/or coordination of their care.   Rexene Alberts, MD 12/06/2017 9:50 AM

## 2017-12-06 NOTE — Progress Notes (Signed)
ANTICOAGULATION CONSULT NOTE - Follow Up Consult  Pharmacy Consult for heparin Indication: CP while awaiting CABG (STEMI Feb 2019)  Labs: Recent Labs    12/04/17 2302 12/05/17 0316 12/05/17 0852 12/05/17 1510 12/05/17 2041 12/06/17 0321  HGB 13.2  --   --   --   --  12.5  HCT 39.4  --   --   --   --  39.2  PLT 319  --   --   --   --  263  HEPARINUNFRC  --   --   --   --  0.24* 0.20*  CREATININE 0.96  --   --   --   --  0.84  TROPONINI <0.03 <0.03 <0.03 <0.03  --   --     Assessment: 46yo female subtherapeutic on heparin with initial dosing for CP.  Goal of Therapy:  Heparin level 0.3-0.7 units/ml   Plan:  Will rebolus with heparin 3000 units and increase heparin gtt by 3 units/kgABW/hr to 1100 units/hr and check level in 6 hours.    Wynona Neat, PharmD, BCPS  12/06/2017,4:52 AM

## 2017-12-10 ENCOUNTER — Ambulatory Visit (HOSPITAL_COMMUNITY)
Admission: RE | Admit: 2017-12-10 | Discharge: 2017-12-10 | Disposition: A | Discharge status: Home / Self Care | Payer: PRIVATE HEALTH INSURANCE | Source: Ambulatory Visit | Attending: Thoracic Surgery (Cardiothoracic Vascular Surgery) | Admitting: Thoracic Surgery (Cardiothoracic Vascular Surgery)

## 2017-12-10 ENCOUNTER — Other Ambulatory Visit: Payer: Self-pay

## 2017-12-10 ENCOUNTER — Encounter (HOSPITAL_COMMUNITY)
Admission: RE | Admit: 2017-12-10 | Discharge: 2017-12-10 | Disposition: A | Discharge status: Home / Self Care | Payer: PRIVATE HEALTH INSURANCE | Source: Ambulatory Visit | Attending: Thoracic Surgery (Cardiothoracic Vascular Surgery) | Admitting: Thoracic Surgery (Cardiothoracic Vascular Surgery)

## 2017-12-10 ENCOUNTER — Ambulatory Visit (HOSPITAL_BASED_OUTPATIENT_CLINIC_OR_DEPARTMENT_OTHER)
Admission: RE | Admit: 2017-12-10 | Discharge: 2017-12-10 | Disposition: A | Discharge status: Home / Self Care | Payer: PRIVATE HEALTH INSURANCE | Source: Ambulatory Visit | Attending: Thoracic Surgery (Cardiothoracic Vascular Surgery) | Admitting: Thoracic Surgery (Cardiothoracic Vascular Surgery)

## 2017-12-10 ENCOUNTER — Encounter (HOSPITAL_COMMUNITY): Payer: Self-pay

## 2017-12-10 DIAGNOSIS — I2510 Atherosclerotic heart disease of native coronary artery without angina pectoris: Secondary | ICD-10-CM

## 2017-12-10 LAB — BLOOD GAS, ARTERIAL
Acid-base deficit: 0.9 mmol/L (ref 0.0–2.0)
Bicarbonate: 23 mmol/L (ref 20.0–28.0)
DRAWN BY: 449841
FIO2: 21
O2 SAT: 97.3 %
PCO2 ART: 36.6 mmHg (ref 32.0–48.0)
PH ART: 7.415 (ref 7.350–7.450)
Patient temperature: 98.6
pO2, Arterial: 93.8 mmHg (ref 83.0–108.0)

## 2017-12-10 LAB — PULMONARY FUNCTION TEST
DL/VA % PRED: 62 %
DL/VA: 2.66 ml/min/mmHg/L
DLCO cor % pred: 63 %
DLCO cor: 11.99 ml/min/mmHg
DLCO unc % pred: 61 %
DLCO unc: 11.65 ml/min/mmHg
FEF 25-75 Post: 2.1 L/sec
FEF 25-75 Pre: 1.08 L/sec
FEF2575-%Change-Post: 93 %
FEF2575-%Pred-Post: 79 %
FEF2575-%Pred-Pre: 40 %
FEV1-%Change-Post: 25 %
FEV1-%PRED-PRE: 72 %
FEV1-%Pred-Post: 90 %
FEV1-POST: 2.27 L
FEV1-Pre: 1.81 L
FEV1FVC-%Change-Post: 1 %
FEV1FVC-%Pred-Pre: 81 %
FEV6-%Change-Post: 23 %
FEV6-%PRED-PRE: 89 %
FEV6-%Pred-Post: 111 %
FEV6-POST: 3.39 L
FEV6-PRE: 2.74 L
FEV6FVC-%Change-Post: 0 %
FEV6FVC-%PRED-POST: 101 %
FEV6FVC-%PRED-PRE: 102 %
FVC-%CHANGE-POST: 23 %
FVC-%PRED-POST: 109 %
FVC-%PRED-PRE: 88 %
FVC-POST: 3.41 L
FVC-PRE: 2.75 L
PRE FEV1/FVC RATIO: 66 %
PRE FEV6/FVC RATIO: 100 %
Post FEV1/FVC ratio: 67 %
Post FEV6/FVC ratio: 99 %
RV % PRED: 115 %
RV: 1.76 L
TLC % PRED: 105 %
TLC: 4.69 L

## 2017-12-10 LAB — URINALYSIS, ROUTINE W REFLEX MICROSCOPIC
Bilirubin Urine: NEGATIVE
GLUCOSE, UA: NEGATIVE mg/dL
HGB URINE DIPSTICK: NEGATIVE
KETONES UR: NEGATIVE mg/dL
Leukocytes, UA: NEGATIVE
NITRITE: NEGATIVE
PH: 6 (ref 5.0–8.0)
Protein, ur: NEGATIVE mg/dL
Specific Gravity, Urine: 1.013 (ref 1.005–1.030)

## 2017-12-10 LAB — TYPE AND SCREEN
ABO/RH(D): B POS
ANTIBODY SCREEN: NEGATIVE

## 2017-12-10 LAB — COMPREHENSIVE METABOLIC PANEL
ALBUMIN: 3.6 g/dL (ref 3.5–5.0)
ALK PHOS: 77 U/L (ref 38–126)
ALT: 16 U/L (ref 14–54)
ANION GAP: 10 (ref 5–15)
AST: 15 U/L (ref 15–41)
BUN: 16 mg/dL (ref 6–20)
CALCIUM: 9.4 mg/dL (ref 8.9–10.3)
CHLORIDE: 109 mmol/L (ref 101–111)
CO2: 21 mmol/L — AB (ref 22–32)
Creatinine, Ser: 0.7 mg/dL (ref 0.44–1.00)
GFR calc non Af Amer: >60 mL/min (ref >60)
GLUCOSE: 128 mg/dL — AB (ref 65–99)
Potassium: 4.2 mmol/L (ref 3.5–5.1)
SODIUM: 140 mmol/L (ref 135–145)
Total Bilirubin: 0.3 mg/dL (ref 0.3–1.2)
Total Protein: 7.1 g/dL (ref 6.5–8.1)

## 2017-12-10 LAB — ABO/RH: ABO/RH(D): B POS

## 2017-12-10 LAB — SURGICAL PCR SCREEN
MRSA, PCR: NEGATIVE
Staphylococcus aureus: NEGATIVE

## 2017-12-10 LAB — PROTIME-INR
INR: 0.96
Prothrombin Time: 12.7 seconds (ref 11.4–15.2)

## 2017-12-10 LAB — GLUCOSE, CAPILLARY: Glucose-Capillary: 130 mg/dL — ABNORMAL HIGH (ref 65–99)

## 2017-12-10 LAB — APTT: aPTT: 28 seconds (ref 24–36)

## 2017-12-10 MED ORDER — ALBUTEROL SULFATE (2.5 MG/3ML) 0.083% IN NEBU
2.5000 mg | INHALATION_SOLUTION | Freq: Once | RESPIRATORY_TRACT | Status: AC
  Administered 2017-12-10: 2.5 mg via RESPIRATORY_TRACT

## 2017-12-10 NOTE — Pre-Procedure Instructions (Addendum)
Melissa Rubio  12/10/2017      [name] [address] [name] [address] Phone: [phone] Fax: [name], [name] [address] Phone: [phone] Fax: [phone]    Your procedure is scheduled on December 12, 2017.  Report to [facility] Admitting at 530 AM.  Call this number if you have problems the morning of surgery:  [phone]   Remember:  No food or drink after midnight. Continue all medications as directed by your physician except follow these medication instructions before surgery below   Take these medicines the morning of surgery with A SIP OF WATER  Tylenol-if needed Alprazolam -if needed for anxiety Omeprazole (prilosec)-if needed buspirone (buspar) Carvedilol (coreg) Cephalexin (keflex) Citalopram (celexa) Cyclobenzaprine (flexeril)-if needed Gabapentin (neurontin) Hydroxyzine (atarax) Nitrostat-if needed for chest pain  Follow your surgeon's instructions on when to hold/resume aspirin  Beginning now, STOP taking any Aleve, Naproxen, Ibuprofen, Motrin, Advil, Goody's, BC's, all herbal medications, fish oil, and all vitamins   WHAT DO I DO ABOUT MY DIABETES MEDICATION?  Marland Kitchen Do not take oral diabetes medicines (pills) the morning of surgery metformin (glucophage).  . THE NIGHT BEFORE SURGERY, take 15 units of novolin N insulin.(1/2 of your normal dose)      THE MORNING OF SURGERY, take 20 units of Novolin N insulin. (1/2 of your normal dose)  . The day of surgery, do not take other diabetes injectables, including Byetta (exenatide), Bydureon (exenatide ER), Victoza (liraglutide), or Trulicity (dulaglutide).  . If your CBG is greater than 220 mg/dL, you may take  of your sliding scale (correction) dose of insulin.   Reviewed and Endorsed by [facility] Patient Education Committee, August 2015  How to Manage Your  Diabetes Before and After Surgery  Why is it important to control my blood sugar before and after surgery? . Improving blood sugar levels before and after surgery helps healing and can limit problems. . A way of improving blood sugar control is eating a healthy diet by: o  Eating less sugar and carbohydrates o  Increasing activity/exercise o  Talking with your doctor about reaching your blood sugar goals . High blood sugars (greater than 180 mg/dL) can raise your risk of infections and slow your recovery, so you will need to focus on controlling your diabetes during the weeks before surgery. . Make sure that the doctor who takes care of your diabetes knows about your planned surgery including the date and location.  How do I manage my blood sugar before surgery? . Check your blood sugar at least 4 times a day, starting 2 days before surgery, to make sure that the level is not too high or low. o Check your blood sugar the morning of your surgery when you wake up and every 2 hours until you get to the Short Stay unit. . If your blood sugar is less than 70 mg/dL, you will need to treat for low blood sugar: o Do not take insulin. o Treat a low blood sugar (less than 70 mg/dL) with  cup of clear juice (cranberry or apple), 4 glucose tablets, OR glucose gel. Recheck blood sugar in 15 minutes after treatment (to make sure it is greater than 70 mg/dL). If your blood sugar is not greater than 70 mg/dL on recheck, call [phone] o  for further instructions. . Report your blood sugar  to the short stay nurse when you get to Short Stay.  . If you are admitted to the hospital after surgery: o Your blood sugar will be checked by the staff and you will probably be given insulin after surgery (instead of oral diabetes medicines) to make sure you have good blood sugar levels. o The goal for blood sugar control after surgery is 80-180 mg/dL.   Do not wear jewelry, make-up or nail polish.  Do not wear  lotions, powders, or perfumes, or deodorant.  Do not shave 48 hours prior to surgery.    Do not bring valuables to the hospital.  [facility] is not responsible for any belongings or valuables.  Contacts, dentures or bridgework may not be worn into surgery.  Leave your suitcase in the car.  After surgery it may be brought to your room.  For patients admitted to the hospital, discharge time will be determined by your treatment team.  Patients discharged the day of surgery will not be allowed to drive home.    [facility]- Preparing For Surgery  Before surgery, you can play an important role. Because skin is not sterile, your skin needs to be as free of germs as possible. You can reduce the number of germs on your skin by washing with CHG (chlorahexidine gluconate) Soap before surgery.  CHG is an antiseptic cleaner which kills germs and bonds with the skin to continue killing germs even after washing.    Oral Hygiene is also important to reduce your risk of infection.  Remember - BRUSH YOUR TEETH THE MORNING OF SURGERY WITH YOUR REGULAR TOOTHPASTE  Please do not use if you have an allergy to CHG or antibacterial soaps. If your skin becomes reddened/irritated stop using the CHG.  Do not shave (including legs and underarms) for at least 48 hours prior to first CHG shower. It is OK to shave your face.  Please follow these instructions carefully.   1. Shower the NIGHT BEFORE SURGERY and the MORNING OF SURGERY with CHG.   2. If you chose to wash your hair, wash your hair first as usual with your normal shampoo.  3. After you shampoo, rinse your hair and body thoroughly to remove the shampoo.  4. Use CHG as you would any other liquid soap. You can apply CHG directly to the skin and wash gently with a scrungie or a clean washcloth.   5. Apply the CHG Soap to your body ONLY FROM THE NECK DOWN.  Do not use on open wounds or open sores. Avoid contact with your eyes, ears, mouth and genitals  (private parts). Wash Face and genitals (private parts)  with your normal soap.  6. Wash thoroughly, paying special attention to the area where your surgery will be performed.  7. Thoroughly rinse your body with warm water from the neck down.  8. DO NOT shower/wash with your normal soap after using and rinsing off the CHG Soap.  9. Pat yourself dry with a CLEAN TOWEL.  10. Wear CLEAN PAJAMAS to bed the night before surgery, wear comfortable clothes the morning of surgery  11. Place CLEAN SHEETS on your bed the night of your first shower and DO NOT SLEEP WITH PETS.  Day of Surgery:  Do not apply any deodorants/lotions.  Please wear clean clothes to the hospital/surgery center.   Remember to brush your teeth WITH YOUR REGULAR TOOTHPASTE.   Please read over the following fact sheets that you were given. Pain Booklet, Coughing and Deep  Breathing, MRSA Information and Surgical Site Infection Prevention

## 2017-12-10 NOTE — Progress Notes (Addendum)
Right Carotid:Velocities in the right ICA are consistent with a 1-39% stenosis.  Left Carotid: Velocities in the left ICA are consistent with a 1-39% stenosis.     Vertebrals: Bilateral vertebral arteries demonstrate antegrade flow.     Right ABI: Resting right ankle-brachial index indicates moderate right lower extremity arterial disease.   Left ABI: Resting left ankle-brachial index is within normal range. No evidence of significant left lower extremity arterial disease.     Right Upper Extremity: Radial Doppler waveforms decrease >50% with compression. Ulnar Doppler waveforms remain within normal limits with compression.   Left Upper Extremity: Radial and Ulnar Doppler waveforms remain within normal limits with compression.    Landry Mellow, RDMS, RVT

## 2017-12-10 NOTE — Progress Notes (Addendum)
PCP: Kenn File, MD  Cardiologist: Carlyle Dolly, MD  EKG: 12/05/17 in [facility]  Stress test: pt denies  ECHO: 5/310/19 in [facility]  Cardiac Cath: 09/01/17 in [facility]  Chest x-ray: 59/29/19 in [facility]

## 2017-12-11 MED ORDER — DOPAMINE-DEXTROSE 3.2-5 MG/ML-% IV SOLN
0.0000 ug/kg/min | INTRAVENOUS | Status: DC
  Filled 2017-12-11: qty 250

## 2017-12-11 MED ORDER — TRANEXAMIC ACID (OHS) PUMP PRIME SOLUTION
2.0000 mg/kg | INTRAVENOUS | Status: DC
  Filled 2017-12-11: qty 1.55

## 2017-12-11 MED ORDER — PLASMA-LYTE 148 IV SOLN
INTRAVENOUS | Status: AC
  Administered 2017-12-12: 500 mL
  Filled 2017-12-11: qty 2.5

## 2017-12-11 MED ORDER — VANCOMYCIN HCL 1000 MG IV SOLR
INTRAVENOUS | Status: AC
  Administered 2017-12-12: 1010 mL
  Filled 2017-12-11: qty 1000

## 2017-12-11 MED ORDER — NITROGLYCERIN IN D5W 200-5 MCG/ML-% IV SOLN
2.0000 ug/min | INTRAVENOUS | Status: DC
  Filled 2017-12-11: qty 250

## 2017-12-11 MED ORDER — MAGNESIUM SULFATE 50 % IJ SOLN
40.0000 meq | INTRAMUSCULAR | Status: DC
  Filled 2017-12-11: qty 9.85

## 2017-12-11 MED ORDER — SODIUM CHLORIDE 0.9 % IV SOLN
1.5000 g | INTRAVENOUS | Status: AC
  Administered 2017-12-12: 1.5 g via INTRAVENOUS
  Filled 2017-12-11: qty 1.5

## 2017-12-11 MED ORDER — MILRINONE LACTATE IN DEXTROSE 20-5 MG/100ML-% IV SOLN
0.1250 ug/kg/min | INTRAVENOUS | Status: DC
  Filled 2017-12-11: qty 100

## 2017-12-11 MED ORDER — SODIUM CHLORIDE 0.9 % IV SOLN
INTRAVENOUS | Status: AC
  Administered 2017-12-12: 1.2 [IU]/h via INTRAVENOUS
  Filled 2017-12-11: qty 1

## 2017-12-11 MED ORDER — SODIUM CHLORIDE 0.9 % IV SOLN
INTRAVENOUS | Status: DC
  Filled 2017-12-11: qty 30

## 2017-12-11 MED ORDER — VANCOMYCIN HCL 10 G IV SOLR
1250.0000 mg | INTRAVENOUS | Status: AC
  Administered 2017-12-12: 1250 mg via INTRAVENOUS
  Filled 2017-12-11: qty 1250

## 2017-12-11 MED ORDER — POTASSIUM CHLORIDE 2 MEQ/ML IV SOLN
80.0000 meq | INTRAVENOUS | Status: DC
  Filled 2017-12-11: qty 40

## 2017-12-11 MED ORDER — DEXMEDETOMIDINE HCL IN NACL 400 MCG/100ML IV SOLN
0.1000 ug/kg/h | INTRAVENOUS | Status: AC
  Administered 2017-12-12: .3 ug/kg/h via INTRAVENOUS
  Filled 2017-12-11: qty 100

## 2017-12-11 MED ORDER — DEXTROSE 5 % IV SOLN
0.0000 ug/min | INTRAVENOUS | Status: DC
  Filled 2017-12-11: qty 4

## 2017-12-11 MED ORDER — SODIUM CHLORIDE 0.9 % IV SOLN
750.0000 mg | INTRAVENOUS | Status: DC
  Filled 2017-12-11: qty 750

## 2017-12-11 MED ORDER — TRANEXAMIC ACID (OHS) BOLUS VIA INFUSION
15.0000 mg/kg | INTRAVENOUS | Status: AC
  Administered 2017-12-12: 1164 mg via INTRAVENOUS
  Filled 2017-12-11: qty 1164

## 2017-12-11 MED ORDER — SODIUM CHLORIDE 0.9 % IV SOLN
30.0000 ug/min | INTRAVENOUS | Status: AC
  Administered 2017-12-12: 25 ug/min via INTRAVENOUS
  Filled 2017-12-11: qty 20

## 2017-12-11 MED ORDER — TRANEXAMIC ACID 1000 MG/10ML IV SOLN
1.5000 mg/kg/h | INTRAVENOUS | Status: AC
  Administered 2017-12-12: 1.5 mg/kg/h via INTRAVENOUS
  Filled 2017-12-11: qty 25

## 2017-12-11 NOTE — Anesthesia Preprocedure Evaluation (Addendum)
Anesthesia Evaluation  Patient identified by MRN, date of birth, ID band Patient awake    Reviewed: Allergy & Precautions, H&P , NPO status , Patient's Chart, lab work & pertinent test results, reviewed documented beta blocker date and time   Airway Mallampati: II  TM Distance: >3 FB Neck ROM: Full    Dental no notable dental hx. (+) Edentulous Upper, Dental Advisory Given   Pulmonary Current Smoker,    Pulmonary exam normal breath sounds clear to auscultation       Cardiovascular Exercise Tolerance: Good hypertension, Pt. on medications and Pt. on home beta blockers + angina + CAD, + Past MI and + Cardiac Stents   Rhythm:Regular Rate:Normal     Neuro/Psych Anxiety Depression negative neurological ROS  negative psych ROS   GI/Hepatic Neg liver ROS, GERD  Medicated and Controlled,  Endo/Other  diabetes, Insulin Dependent, Oral Hypoglycemic Agents  Renal/GU negative Renal ROS  negative genitourinary   Musculoskeletal   Abdominal   Peds  Hematology negative hematology ROS (+)   Anesthesia Other Findings   Reproductive/Obstetrics negative OB ROS                            Anesthesia Physical Anesthesia Plan  ASA: IV  Anesthesia Plan: General   Post-op Pain Management:    Induction: Intravenous  PONV Risk Score and Plan: 2 and Treatment may vary due to age or medical condition and Midazolam  Airway Management Planned: Oral ETT  Additional Equipment: Arterial line, CVP, PA Cath, TEE and Ultrasound Guidance Line Placement  Intra-op Plan:   Post-operative Plan: Post-operative intubation/ventilation  Informed Consent: I have reviewed the patients History and Physical, chart, labs and discussed the procedure including the risks, benefits and alternatives for the proposed anesthesia with the patient or authorized representative who has indicated his/her understanding and acceptance.    Dental advisory given  Plan Discussed with: CRNA  Anesthesia Plan Comments:         Anesthesia Quick Evaluation

## 2017-12-11 NOTE — H&P (Addendum)
Melissa [address]       [place] [address]             [phone]          CARDIOTHORACIC SURGERY HISTORY AND PHYSICAL EXAM  Referring Provider is Branch, Alphonse Guild, MD PCP is Timmothy Euler, MD      Chief Complaint  Patient presents with  . Coronary Artery Disease    eval for CABG s/p STEMI 09/01/17.. ...CATH 09/01/17, ECHO 09/02/17.Marland KitchenMarland KitchenRCA DES PLACED AT TIME OF CATH    HPI:  Patient is a 47 year old moderately obese female with history of hypertension, type 2 diabetes mellitus, hyperlipidemia, long-standing tobacco abuse, and a strong family history of coronary artery disease who recently presented with an acute inferior wall ST segment elevation myocardial infarction, was treated with PCI and stenting of the right coronary artery, and has now been referred for elective surgical consultation to discuss treatment options for management of left main and severe three-vessel coronary artery disease.  The patient states that she was in her usual state of health until early February of this year when she began to experience episodes of substernal chest discomfort described as a burning pain which she initially attributed to indigestion.  Symptoms were frequent and not necessarily related to physical activity or meals.  On the morning of September 01, 2017 the patient developed similar symptoms of burning substernal chest pain associated with shortness of breath that persisted.  She presented to the emergency department where she was diagnosed with acute evolving inferior wall ST segment elevation myocardial infarction.  She was taken directly to the Cath Lab by Dr. Martinique where she was found to have 100% occlusion of the mid right coronary artery.  She was treated with PCI and stenting using a single drug-eluting stent in the mid right coronary artery.  She was also found to have 50% left main coronary artery stenosis with ostial stenosis of the left anterior descending  coronary artery and the left circumflex coronary artery.  Follow-up echocardiogram performed the next day revealed preserved left ventricular systolic function.  The patient recovered uneventfully and was recently seen in follow-up by Dr. Harl Bowie.  Brilinta was stopped and she was started on Plavix as an alternative because of cost.  She was referred for elective surgical consultation.  She was originally seen in consultation on October 21, 2017.  She reports that since then she has remained clinically stable.  At her last office visit with Bernerd Pho at [facility] she complained of palpitations.  She underwent a 48-hour Holter monitor which revealed only sinus rhythm with sinus tachycardia and no sustained irregular heart rhythms.  She returns for office today and reports that she is doing reasonably well.  She is still having palpitations.  She has tried to stop smoking.  She states that she only occasionally smokes a cigarette.  She has not had any substernal chest pain or chest tightness similar to that which prompted her presentation in February.  The patient is single but currently lives with her fianc in [place].  She is not currently working.  She has 2 adult children.  She has a long-standing history of tobacco abuse.  She states that since hospital discharge from her recent heart attack she has smoked only 1 cigarette.  She lives a fairly sedentary lifestyle.  Prior to her heart attack she states that she was limited by lower back pain and bilateral hip pain related to  degenerative arthritis and lumbar disc disease.  Since hospital discharge she has not had any symptoms of substernal chest pain or chest tightness similar to that which occurred prior to her recent heart attack.  She states that she has poor energy and she gets short of breath with activity.  She denies any resting shortness of breath.  She has had some palpitations.  She reports occasional mild dizzy spells.  She  denies lower extremity edema.   Past Medical History:  Diagnosis Date  . Anxiety   . Cancer [facility]) 1994   cervical  . Coronary artery disease    2/19 PCI/DES x1 to dRCA with LM, LAD disease (planned for possible CABG in near future), normal EF  . Coronary artery disease involving native coronary artery of native heart with angina pectoris ([place])   . Diabetes ([place])   . Hypertension   . Itching   . Myocardial infarction ([place])   . RLS (restless legs syndrome)   . Tobacco use     Past Surgical History:  Procedure Laterality Date  . CARDIAC CATHETERIZATION    . CORONARY STENT INTERVENTION N/A 09/01/2017   Procedure: CORONARY STENT INTERVENTION;  Surgeon: Martinique, Peter M, MD;  Location: [place] [facility];  Service: Cardiovascular;  Laterality: N/A;  . CORONARY/GRAFT ACUTE MI REVASCULARIZATION N/A 09/01/2017   Procedure: Coronary/Graft Acute MI Revascularization;  Surgeon: Martinique, Peter M, MD;  Location: [place] [facility];  Service: Cardiovascular;  Laterality: N/A;  . LEFT HEART CATH AND CORONARY ANGIOGRAPHY N/A 09/01/2017   Procedure: LEFT HEART CATH AND CORONARY ANGIOGRAPHY;  Surgeon: Martinique, Peter M, MD;  Location: [place] [facility];  Service: Cardiovascular;  Laterality: N/A;  . PARTIAL HYSTERECTOMY  2009    Family History  Problem Relation Age of Onset  . Cancer Mother   . Heart attack Father   . Diabetes Sister   . Diabetes Brother   . Diabetes Sister   . Heart attack Sister     Social History Social History   Tobacco Use  . Smoking status: Current Some Day Smoker    Packs/day: 0.75    Years: 35.00    Pack years: 26.25    Types: Cigarettes    Start date: 09/01/1981  . Smokeless tobacco: Never Used  . Tobacco comment: 1/2 pack daily  Substance Use Topics  . Alcohol use: No    Frequency: Never  . Drug use: Never    Prior to Admission medications   Medication Sig Start Date End Date Taking? Authorizing Provider  acetaminophen (TYLENOL) 325 MG tablet Take 650 mg  by mouth 2 (two) times daily as needed for moderate pain or headache.   Yes [provider]  ALPRAZolam (XANAX) 0.5 MG tablet Take 1 tablet (0.5 mg total) by mouth 3 (three) times daily as needed for anxiety. 10/22/17  Yes Timmothy Euler, MD  aspirin 81 MG chewable tablet Chew 1 tablet (81 mg total) by mouth daily. 09/04/17  Yes Reino Bellis B, NP  atorvastatin (LIPITOR) 80 MG tablet TAKE 1 TABLET BY MOUTH ONCE DAILY AT  6PM 09/30/17  Yes Branch, Alphonse Guild, MD  busPIRone (BUSPAR) 10 MG tablet Take 1 tablet (10 mg total) by mouth 3 (three) times daily. 03/12/17  Yes Claretta Fraise, MD  carvedilol (COREG) 3.125 MG tablet Take 1 tablet (3.125 mg total) by mouth 2 (two) times daily with a meal. 09/19/17  Yes Branch, Alphonse Guild, MD  cephALEXin (KEFLEX) 500 MG capsule Take 500 mg by mouth  3 (three) times daily. 12/02/17  Yes [provider]  citalopram (CELEXA) 40 MG tablet TAKE 1 TABLET BY MOUTH ONCE DAILY 11/25/17  Yes Claretta Fraise, MD  cyclobenzaprine (FLEXERIL) 10 MG tablet Take 1 tablet (10 mg total) by mouth 3 (three) times daily as needed for muscle spasms. 04/11/17  Yes Timmothy Euler, MD  gabapentin (NEURONTIN) 800 MG tablet Take 1 tablet (800 mg total) by mouth 4 (four) times daily. 10/22/17  Yes Timmothy Euler, MD  hydrOXYzine (ATARAX/VISTARIL) 50 MG tablet Take 1 tablet (50 mg total) by mouth every 8 (eight) hours as needed. Patient taking differently: Take 50 mg by mouth every 8 (eight) hours as needed for anxiety or itching.  02/06/17  Yes Stacks, Cletus Gash, MD  insulin NPH Human (HUMULIN N,NOVOLIN N) 100 UNIT/ML injection Inject 0.4 mLs (40 Units total) 2 (two) times daily before a meal into the skin. Patient taking differently: Inject 30-40 Units into the skin See admin instructions. 40 units in the morning and 30 units at night 05/23/17  Yes Timmothy Euler, MD  Menthol, Topical Analgesic, (ICY HOT EX) Apply 1 application topically daily as needed (back pain).   Yes  [provider]  metFORMIN (GLUCOPHAGE) 1000 MG tablet Take 1 tablet (1,000 mg total) by mouth 2 (two) times daily with a meal. 02/06/17  Yes Stacks, Cletus Gash, MD  nitroGLYCERIN (NITROSTAT) 0.4 MG SL tablet Place 1 tablet (0.4 mg total) under the tongue every 5 (five) minutes as needed. Patient taking differently: Place 0.4 mg under the tongue every 5 (five) minutes as needed for chest pain.  09/03/17  Yes Cheryln Manly, NP  omeprazole (PRILOSEC) 20 MG capsule Take 20 mg by mouth daily.   Yes [provider]  ezetimibe (ZETIA) 10 MG tablet Take 1 tablet (10 mg total) by mouth daily. 12/06/17   Mikhail, Velta Addison, DO  INSULIN SYRINGE .5CC/29G 29G X 1/2" 0.5 ML MISC 1 Syringe by Does not apply route 2 (two) times daily. 04/11/17   Timmothy Euler, MD  isosorbide mononitrate (IMDUR) 30 MG 24 hr tablet Take 0.5 tablets (15 mg total) by mouth daily. 12/07/17   Mikhail, Velta Addison, DO  nicotine (NICODERM CQ - DOSED IN MG/24 HOURS) 21 mg/24hr patch Place 1 patch (21 mg total) onto the skin daily. 12/07/17   Mikhail, Velta Addison, DO    No Known Allergies    Review of Systems:              General:                      normal appetite, decreased energy, + weight gain, no weight loss, no fever             Cardiac:                       + chest pain with exertion, + chest pain at rest, +SOB with exertion, no resting SOB, no PND, no orthopnea, + palpitations, no arrhythmia, no atrial fibrillation, no LE edema, + dizzy spells, no syncope             Respiratory:                 + shortness of breath, no home oxygen, no productive cough, intermittent dry cough, no bronchitis, no wheezing, no hemoptysis, no asthma, no pain with inspiration or cough, no sleep apnea, no CPAP at night  GI:                               no difficulty swallowing, no reflux, no frequent heartburn, no hiatal hernia, no abdominal pain, no constipation, no diarrhea, no hematochezia, no hematemesis, no melena              GU:                              no dysuria,  no frequency, no urinary tract infection, no hematuria, no kidney stones, no kidney disease             Vascular:                     no pain suggestive of claudication, + pain in feet, no leg cramps, no varicose veins, no DVT, no non-healing foot ulcer             Neuro:                         no stroke, no TIA's, no seizures, + headaches, no temporary blindness one eye,  no slurred speech, + peripheral neuropathy, + chronic pain, + instability of gait, no memory/cognitive dysfunction             Musculoskeletal:         + arthritis, no joint swelling, no myalgias, + difficulty walking, limited mobility              Skin:                            no rash, no itching, no skin infections, no pressure sores or ulcerations             Psych:                         + anxiety, + depression, no nervousness, no unusual recent stress             Eyes:                           no blurry vision, no floaters, no recent vision changes, + wears glasses or contacts             ENT:                            no hearing loss, no loose or painful teeth, + dentures, last saw dentist 2017             Hematologic:               no easy bruising, no abnormal bleeding, no clotting disorder, no frequent epistaxis             Endocrine:                   + diabetes, does ncheck CBG's at home                           Physical Exam:              BP 108/64 (BP Location: Left Arm,  Patient Position: Sitting, Cuff Size: Large)   Pulse 87   Resp 16   Ht 5' (1.524 m)   Wt 162 lb (73.5 kg)   SpO2 97% Comment: ON RA  BMI 31.64 kg/m              General:                      Mildly obese,  well-appearing             HEENT:                       Unremarkable              Neck:                           no JVD, no bruits, no adenopathy              Chest:                          clear to auscultation, symmetrical breath sounds, no wheezes, no rhonchi              CV:                               RRR, no  murmur              Abdomen:                    soft, non-tender, no masses              Extremities:                 warm, well-perfused, pulses diminished but palpable, no LE edema             Rectal/GU                   Deferred             Neuro:                         Grossly non-focal and symmetrical throughout             Skin:                            Clean and dry, no rashes, no breakdown   Diagnostic Tests:  Coronary/Graft Acute MI Revascularization  CORONARY STENT INTERVENTION  LEFT HEART CATH AND CORONARY ANGIOGRAPHY  Conclusion     Dist RCA lesion is 100% stenosed.  Prox RCA to Mid RCA lesion is 25% stenosed.  Dist LM-2 lesion is 50% stenosed.  Dist LM to Ost LAD lesion is 65% stenosed.  Ost Cx to Prox Cx lesion is 80% stenosed.  Post intervention, there is a 0% residual stenosis.  A drug-eluting stent was successfully placed using a STENT SYNERGY DES 2.5X32.  The left ventricular systolic function is normal.  LV end diastolic pressure is mildly elevated.  The left ventricular ejection fraction is 55-65% by visual estimate.  1. 3 vessel obstructive CAD. 100% distal RCA is the culprit vessel. She also has 50% distal left main, 65% ostial LAD, and 80% ostial LCx 2. Inferior  wall motion abnormality with preserved LV systolic function 3. Mildly elevated LVEDP 4. Successful stenting of the distal RCA with DES  Plan: DAPT for one year. Will need to review films with interventional colleagues to decide management of residual disease in LCA. For now will treat medically. It is not well suited for PCI. Will need to decide whether CABG is needed after she has healed from MI.      Indications   ST elevation myocardial infarction involving right coronary artery (HCC) [I21.11 (ICD-10-CM)]  Procedural Details/Technique   Technical Details Indication: 47 yo WF with history of DM presents with an inferior  STEMI   Procedural Details: The right wrist was prepped, draped, and anesthetized with 1% lidocaine. Using the modified Seldinger technique and US guidance, a 6 French slender sheath was introduced into the right radial artery. 3 mg of verapamil was administered through the sheath, weight-based unfractionated heparin was administered intravenously. A JR4 catheter was used to image the RCA. A LA1 guide was used for PCI and to image the LCA. A Pigtail catheter was used for left ventriculography. Catheter exchanges were performed over an exchange length guidewire.    PCI Note: Following the diagnostic procedure, the decision was made to proceed with PCI. Weight-based heparin was given for anticoagulation. Brilinta 180 mg was given orally. Once a therapeutic ACT was achieved, a 6 [place] LA1 guide catheter was inserted. A Choice PT MS coronary guidewire was used to cross the lesion. The lesion was predilated with a 2.0 mm balloon. The lesion was then stented with a 2.5 x 32 mm Synergy stent. The stent was postdilated with a 2.75 mm noncompliant balloon to 18 atm. With reperfusion the patient developed sustained AIVR and hypotension. She was given IV Dopamine for support but by the end of the case her rhythm and BP were stable and Dopamine was discontinued. Following PCI, there was 0% residual stenosis and TIMI-3 flow. Final angiography confirmed an excellent result. The patient tolerated the procedure well. There were no immediate procedural complications. A TR band was used for radial hemostasis. The patient was transferred to the post catheterization recovery area for further monitoring. Contrast: 150 cc   Estimated blood loss <50 mL.  During this procedure the patient was administered the following to achieve and maintain moderate conscious sedation: Versed 2 mg, Fentanyl mcg, while the patient's heart rate, blood pressure, and oxygen saturation were continuously monitored. The period of conscious sedation  was 32 minutes, of which I was present face-to-face 100% of this time.  Complications   Complications documented before study signed (09/01/2017 5:03 PM EST)    No complications were associated with this study.  Documented by Martinique, Peter M, MD - 09/01/2017 4:56 PM EST    Coronary Findings   Diagnostic  Dominance: Right  Left Main  Dist LM-2 lesion 50% stenosed  Dist LM-2 lesion is 50% stenosed.  Dist LM to Ost LAD lesion 65% stenosed  Dist LM to Ost LAD lesion is 65% stenosed.  Left Circumflex  Ost Cx to Prox Cx lesion 80% stenosed  Ost Cx to Prox Cx lesion is 80% stenosed.  Right Coronary Artery  Prox RCA to Mid RCA lesion 25% stenosed  Prox RCA to Mid RCA lesion is 25% stenosed.  Dist RCA lesion 100% stenosed  Dist RCA lesion is 100% stenosed.  Intervention   Dist RCA lesion  Stent  Lesion crossed with guidewire using a WIRE PT2 MS 185. Pre-stent angioplasty was performed using a BALLOON SAPPHIRE  2.2U63. A drug-eluting stent was successfully placed using a STENT SYNERGY DES 2.5X32. Stent strut is well apposed. Post-stent angioplasty was performed using a BALLOON SAPPHIRE [place] [id number]. Maximum pressure: 18 atm.  Post-Intervention Lesion Assessment  The intervention was successful. Pre-interventional TIMI flow is 0. Post-intervention TIMI flow is 3. No complications occurred at this lesion.  There is no residual stenosis post intervention.  Wall Motion              Left Heart   Left Ventricle The left ventricular size is normal. The left ventricular systolic function is normal. LV end diastolic pressure is mildly elevated. The left ventricular ejection fraction is 55-65% by visual estimate. There are LV function abnormalities due to segmental dysfunction.  Coronary Diagrams   Diagnostic Diagram       Post-Intervention Diagram         Transthoracic Echocardiography  Patient: Melissa Rubio, Melissa Rubio MR #: [id number] Study Date:  09/02/2017 Gender: F Age: 68 Height: 154.9 cm Weight: 75.2 kg BSA: 1.83 m^2 Pt. Status: Room: [facility]  ADMITTING Peter Martinique, M.D. ATTENDING Peter Martinique, M.D. ORDERING Peter Martinique, M.D. REFERRING Peter Martinique, M.D. PERFORMING [facility], Inpatient SONOGRAPHER Jannett Celestine, RDCS  cc:  ------------------------------------------------------------------- LV EF: 55% - 60%  ------------------------------------------------------------------- History: PMH: Inferior STEMI. CAD Native Vessel 414.01. Risk factors: Current tobacco use. Hypertension. Diabetes mellitus.  ------------------------------------------------------------------- Study Conclusions  - Left ventricle: The cavity size was normal. Wall thickness was normal. Systolic function was normal. The estimated ejection fraction was in the range of 55% to 60% with hypokinesis hypokinesis of the distal inferior, inferoseptal walls.  ------------------------------------------------------------------- Study data: No prior study was available for comparison. Study status: Routine. Procedure: The patient reported no pain pre or post test. Transthoracic echocardiography. Image quality was adequate. Study completion: There were no complications. Transthoracic echocardiography. M-mode, complete 2D, spectral Doppler, and color Doppler. Birthdate: Patient birthdate: 07/19/1970. Age: Patient is 47 yr old. Sex: Gender: female. BMI: 31.3 kg/m^2. Blood pressure: 96/76 Patient status: Inpatient. Study date: Study date: 09/02/2017. Study time: 08:06 AM. Location: ICU/CCU  -------------------------------------------------------------------  ------------------------------------------------------------------- Left ventricle: The cavity size was normal. Wall thickness was normal. Systolic function was normal. The estimated ejection fraction was  in the range of 55% to 60% with hypokinesis hypokinesis of the distal inferior, inferoseptal walls.  ------------------------------------------------------------------- Aortic valve: Structurally normal valve. Cusp separation was normal. Doppler: Transvalvular velocity was within the normal range. There was no stenosis. There was no regurgitation.  ------------------------------------------------------------------- Mitral valve: Structurally normal valve. Leaflet separation was normal. Doppler: Transvalvular velocity was within the normal range. There was no evidence for stenosis. There was no regurgitation. Peak gradient (D): 5 mm Hg.  ------------------------------------------------------------------- Left atrium: The atrium was normal in size.  ------------------------------------------------------------------- Right ventricle: The cavity size was normal. Wall thickness was normal. Systolic function was normal.  ------------------------------------------------------------------- Pulmonic valve: Poorly visualized. Doppler: There was trivial regurgitation.  ------------------------------------------------------------------- Tricuspid valve: Structurally normal valve. Leaflet separation was normal. Doppler: Transvalvular velocity was within the normal range. There was no regurgitation.  ------------------------------------------------------------------- Right atrium: The atrium was normal in size.  ------------------------------------------------------------------- Pericardium: There was no pericardial effusion.  ------------------------------------------------------------------- Systemic veins: Inferior vena cava: The vessel was normal in size. The respirophasic diameter changes were in the normal range (>= 50%), consistent with normal central venous  pressure.  ------------------------------------------------------------------- Measurements  Left ventricle Value Reference LV ID, ED, PLAX chordal 44.3 mm 43 - 52 LV ID, ES, PLAX chordal 32.2 mm 23 - 38 LV fx shortening, PLAX chordal (L) 27 % >=  29 LV PW thickness, ED 11.4 mm ---------- IVS/LV PW ratio, ED 0.96 <=1.3 Stroke volume, 2D 39 ml ---------- Stroke volume/bsa, 2D 21 ml/m^2 ---------- LV e&', lateral 6.85 cm/s ---------- LV E/e&', lateral 16.06 ---------- LV e&', medial 6.42 cm/s ---------- LV E/e&', medial 17.13 ---------- LV e&', average 6.64 cm/s ---------- LV E/e&', average 16.58 ----------  Ventricular septum Value Reference IVS thickness, ED 11 mm ----------  LVOT Value Reference LVOT ID, S 19 mm ---------- LVOT area 2.84 cm^2 ---------- LVOT peak velocity, S 75.5 cm/s ---------- LVOT mean velocity, S 45.1 cm/s ---------- LVOT VTI, S 13.6 cm ----------  Aorta Value Reference Aortic root ID, ED 28 mm ----------  Left atrium Value Reference LA ID, A-P, ES 36 mm ---------- LA ID/bsa, A-P 1.97 cm/m^2 <=2.2 LA volume, S  28.6 ml ---------- LA volume/bsa, S 15.6 ml/m^2 ---------- LA volume, ES, 1-p A4C 31.5 ml ---------- LA volume/bsa, ES, 1-p A4C 17.2 ml/m^2 ---------- LA volume, ES, 1-p A2C 24.1 ml ---------- LA volume/bsa, ES, 1-p A2C 13.2 ml/m^2 ----------  Mitral valve Value Reference Mitral E-wave peak velocity 110 cm/s ---------- Mitral A-wave peak velocity 74.1 cm/s ---------- Mitral deceleration time 190 ms 150 - 230 Mitral peak gradient, D 5 mm Hg ---------- Mitral E/A ratio, peak 1.5 ----------  Right atrium Value Reference RA ID, S-I, ES, A4C 40.9 mm 34 - 49 RA area, ES, A4C 8.79 cm^2 8.3 - 19.5 RA volume, ES, A/L 14.5 ml ---------- RA volume/bsa, ES, A/L 7.9 ml/m^2 ----------  Right ventricle Value Reference RV ID, minor axis, ED, A4C base 33.8 mm ---------- RV ID, minor axis, ED, A4C mid 31.2 mm ---------- RV ID, major axis, ED, A4C 64.8 mm 55 - 91 TAPSE 15.1 mm ---------- RV s&', lateral, S 9.68 cm/s ----------  Legend: (L) and (H) mark values outside specified reference range.  ------------------------------------------------------------------- Prepared and Electronically Authenticated by  Dorris Carnes, M.D. 2019-02-25T13:48:41    Impression:  Patient has left main and three-vessel coronary artery disease. She recently presented with an acute inferior wall ST segment elevation  myocardial infarction that was successfully treated using PCI and stenting of the mid right coronary artery using a drug eluting stent. She had an angiographically good result following PCI and she has remained clinically stable with no recurrent symptoms of chest pain or chest tightness. She does still get short of breath with moderate and low-level activity. I have personally reviewed the patient's diagnostic cardiac catheterization and transthoracic echocardiogram. She has significant greater than 50% stenosis of the distal left main coronary artery with ostial stenosis of both the left anterior descending coronary artery and the left circumflex coronary artery. I agree she would likely benefit from surgical revascularization.   Plan:  I have again reviewed the indications, risk, and potential benefits of coronary artery bypass grafting with the patient in the office today.  Expectations for her postoperative convalescence have been discussed..  The patient understands and accepts all potential associated risks of surgery including but not limited to risk of death, stroke or other neurologic complication, myocardial infarction, congestive heart failure, respiratory failure, renal failure, bleeding requiring blood transfusion and/or reexploration, aortic dissection or other major vascular complication, arrhythmia, heart block or bradycardia requiring permanent pacemaker, pneumonia, pleural effusion, wound infection, pulmonary embolus or other thromboembolic complication, chronic pain or other delayed complications related to median sternotomy, or the late recurrence of symptomatic ischemic heart disease and/or congestive heart failure.  The importance of long term risk modification have been emphasized.  She has been instructed to stop taking Plavix 7 days prior to surgery.  She will remain on all other medications through the night before surgery.  All questions answered.      Valentina Gu.  Roxy Manns, MD 11/25/2017 12:12 PM

## 2017-12-12 ENCOUNTER — Inpatient Hospital Stay (HOSPITAL_COMMUNITY): Payer: PRIVATE HEALTH INSURANCE | Admitting: Certified Registered Nurse Anesthetist

## 2017-12-12 ENCOUNTER — Inpatient Hospital Stay (HOSPITAL_COMMUNITY): Payer: PRIVATE HEALTH INSURANCE

## 2017-12-12 ENCOUNTER — Other Ambulatory Visit: Payer: Self-pay

## 2017-12-12 ENCOUNTER — Inpatient Hospital Stay (HOSPITAL_COMMUNITY)
Admission: RE | Disposition: A | Discharge status: Home / Self Care | Payer: Self-pay | Source: Home / Self Care | Attending: Thoracic Surgery (Cardiothoracic Vascular Surgery)

## 2017-12-12 ENCOUNTER — Inpatient Hospital Stay (HOSPITAL_COMMUNITY)
Admission: RE | Admit: 2017-12-12 | Discharge: 2017-12-17 | DRG: 236 | Disposition: A | Discharge status: Home / Self Care | Payer: PRIVATE HEALTH INSURANCE | Attending: Thoracic Surgery (Cardiothoracic Vascular Surgery) | Admitting: Thoracic Surgery (Cardiothoracic Vascular Surgery)

## 2017-12-12 ENCOUNTER — Encounter (HOSPITAL_COMMUNITY): Payer: Self-pay | Admitting: *Deleted

## 2017-12-12 DIAGNOSIS — E669 Obesity, unspecified: Secondary | ICD-10-CM | POA: Diagnosis present

## 2017-12-12 DIAGNOSIS — J939 Pneumothorax, unspecified: Secondary | ICD-10-CM

## 2017-12-12 DIAGNOSIS — F17210 Nicotine dependence, cigarettes, uncomplicated: Secondary | ICD-10-CM | POA: Diagnosis present

## 2017-12-12 DIAGNOSIS — Z6829 Body mass index (BMI) 29.0-29.9, adult: Secondary | ICD-10-CM

## 2017-12-12 DIAGNOSIS — Z8249 Family history of ischemic heart disease and other diseases of the circulatory system: Secondary | ICD-10-CM | POA: Diagnosis not present

## 2017-12-12 DIAGNOSIS — R079 Chest pain, unspecified: Secondary | ICD-10-CM | POA: Diagnosis present

## 2017-12-12 DIAGNOSIS — G2581 Restless legs syndrome: Secondary | ICD-10-CM | POA: Diagnosis present

## 2017-12-12 DIAGNOSIS — I10 Essential (primary) hypertension: Secondary | ICD-10-CM | POA: Diagnosis present

## 2017-12-12 DIAGNOSIS — Z951 Presence of aortocoronary bypass graft: Secondary | ICD-10-CM

## 2017-12-12 DIAGNOSIS — I25119 Atherosclerotic heart disease of native coronary artery with unspecified angina pectoris: Secondary | ICD-10-CM | POA: Diagnosis present

## 2017-12-12 DIAGNOSIS — D62 Acute posthemorrhagic anemia: Secondary | ICD-10-CM | POA: Diagnosis not present

## 2017-12-12 DIAGNOSIS — R6510 Systemic inflammatory response syndrome (SIRS) of non-infectious origin without acute organ dysfunction: Secondary | ICD-10-CM | POA: Diagnosis not present

## 2017-12-12 DIAGNOSIS — Z833 Family history of diabetes mellitus: Secondary | ICD-10-CM | POA: Diagnosis not present

## 2017-12-12 DIAGNOSIS — I2510 Atherosclerotic heart disease of native coronary artery without angina pectoris: Secondary | ICD-10-CM | POA: Diagnosis present

## 2017-12-12 DIAGNOSIS — E119 Type 2 diabetes mellitus without complications: Secondary | ICD-10-CM

## 2017-12-12 DIAGNOSIS — Z7982 Long term (current) use of aspirin: Secondary | ICD-10-CM | POA: Diagnosis not present

## 2017-12-12 DIAGNOSIS — Z6833 Body mass index (BMI) 33.0-33.9, adult: Secondary | ICD-10-CM

## 2017-12-12 DIAGNOSIS — I252 Old myocardial infarction: Secondary | ICD-10-CM

## 2017-12-12 DIAGNOSIS — Z955 Presence of coronary angioplasty implant and graft: Secondary | ICD-10-CM

## 2017-12-12 DIAGNOSIS — E8770 Fluid overload, unspecified: Secondary | ICD-10-CM | POA: Diagnosis not present

## 2017-12-12 DIAGNOSIS — E785 Hyperlipidemia, unspecified: Secondary | ICD-10-CM | POA: Diagnosis present

## 2017-12-12 DIAGNOSIS — I2583 Coronary atherosclerosis due to lipid rich plaque: Secondary | ICD-10-CM

## 2017-12-12 DIAGNOSIS — I471 Supraventricular tachycardia: Secondary | ICD-10-CM | POA: Diagnosis not present

## 2017-12-12 DIAGNOSIS — J9811 Atelectasis: Secondary | ICD-10-CM

## 2017-12-12 DIAGNOSIS — Z794 Long term (current) use of insulin: Secondary | ICD-10-CM

## 2017-12-12 DIAGNOSIS — F419 Anxiety disorder, unspecified: Secondary | ICD-10-CM | POA: Diagnosis present

## 2017-12-12 HISTORY — DX: Presence of aortocoronary bypass graft: Z95.1

## 2017-12-12 HISTORY — PX: CORONARY ARTERY BYPASS GRAFT: SHX141

## 2017-12-12 HISTORY — PX: TEE WITHOUT CARDIOVERSION: SHX5443

## 2017-12-12 LAB — POCT I-STAT 3, ART BLOOD GAS (G3+)
ACID-BASE DEFICIT: 6 mmol/L — AB (ref 0.0–2.0)
ACID-BASE DEFICIT: 5 mmol/L — AB (ref 0.0–2.0)
Acid-base deficit: 1 mmol/L (ref 0.0–2.0)
Acid-base deficit: 3 mmol/L — ABNORMAL HIGH (ref 0.0–2.0)
Acid-base deficit: 3 mmol/L — ABNORMAL HIGH (ref 0.0–2.0)
Acid-base deficit: 4 mmol/L — ABNORMAL HIGH (ref 0.0–2.0)
BICARBONATE: 23.9 mmol/L (ref 20.0–28.0)
BICARBONATE: 24 mmol/L (ref 20.0–28.0)
Bicarbonate: 22 mmol/L (ref 20.0–28.0)
Bicarbonate: 21.7 mmol/L (ref 20.0–28.0)
Bicarbonate: 22.7 mmol/L (ref 20.0–28.0)
Bicarbonate: 21.9 mmol/L (ref 20.0–28.0)
O2 SAT: 98 %
O2 Saturation: 100 %
O2 Saturation: 93 %
O2 Saturation: 99 %
O2 Saturation: 94 %
O2 Saturation: 98 %
PCO2 ART: 39.9 mmHg (ref 32.0–48.0)
PCO2 ART: 54 mmHg — AB (ref 32.0–48.0)
PCO2 ART: 46.6 mmHg (ref 32.0–48.0)
PH ART: 7.386 (ref 7.350–7.450)
PH ART: 7.346 — AB (ref 7.350–7.450)
PH ART: 7.26 — AB (ref 7.350–7.450)
PH ART: 7.288 — AB (ref 7.350–7.450)
PO2 ART: 300 mmHg — AB (ref 83.0–108.0)
PO2 ART: 87 mmHg (ref 83.0–108.0)
Patient temperature: 38
Patient temperature: 38.5
TCO2: 25 mmol/L (ref 22–32)
TCO2: 24 mmol/L (ref 22–32)
TCO2: 23 mmol/L (ref 22–32)
TCO2: 24 mmol/L (ref 22–32)
TCO2: 26 mmol/L (ref 22–32)
TCO2: 23 mmol/L (ref 22–32)
pCO2 arterial: 53.2 mmHg — ABNORMAL HIGH (ref 32.0–48.0)
pCO2 arterial: 49.2 mmHg — ABNORMAL HIGH (ref 32.0–48.0)
pCO2 arterial: 41.6 mmHg (ref 32.0–48.0)
pH, Arterial: 7.219 — ABNORMAL LOW (ref 7.350–7.450)
pH, Arterial: 7.252 — ABNORMAL LOW (ref 7.350–7.450)
pO2, Arterial: 76 mmHg — ABNORMAL LOW (ref 83.0–108.0)
pO2, Arterial: 119 mmHg — ABNORMAL HIGH (ref 83.0–108.0)
pO2, Arterial: 138 mmHg — ABNORMAL HIGH (ref 83.0–108.0)
pO2, Arterial: 123 mmHg — ABNORMAL HIGH (ref 83.0–108.0)

## 2017-12-12 LAB — POCT I-STAT, CHEM 8
BUN: 19 mg/dL (ref 6–20)
BUN: 18 mg/dL (ref 6–20)
BUN: 15 mg/dL (ref 6–20)
BUN: 16 mg/dL (ref 6–20)
BUN: 16 mg/dL (ref 6–20)
BUN: 16 mg/dL (ref 6–20)
CALCIUM ION: 1.09 mmol/L — AB (ref 1.15–1.40)
CHLORIDE: 108 mmol/L (ref 101–111)
CHLORIDE: 107 mmol/L (ref 101–111)
CHLORIDE: 107 mmol/L (ref 101–111)
CHLORIDE: 108 mmol/L (ref 101–111)
CHLORIDE: 111 mmol/L (ref 101–111)
CREATININE: 0.5 mg/dL (ref 0.44–1.00)
CREATININE: 0.5 mg/dL (ref 0.44–1.00)
CREATININE: 0.6 mg/dL (ref 0.44–1.00)
Calcium, Ion: 1.29 mmol/L (ref 1.15–1.40)
Calcium, Ion: 1.24 mmol/L (ref 1.15–1.40)
Calcium, Ion: 1.02 mmol/L — ABNORMAL LOW (ref 1.15–1.40)
Calcium, Ion: 1.05 mmol/L — ABNORMAL LOW (ref 1.15–1.40)
Calcium, Ion: 1.12 mmol/L — ABNORMAL LOW (ref 1.15–1.40)
Chloride: 110 mmol/L (ref 101–111)
Creatinine, Ser: 0.6 mg/dL (ref 0.44–1.00)
Creatinine, Ser: 0.5 mg/dL (ref 0.44–1.00)
Creatinine, Ser: 0.5 mg/dL (ref 0.44–1.00)
GLUCOSE: 97 mg/dL (ref 65–99)
GLUCOSE: 154 mg/dL — AB (ref 65–99)
Glucose, Bld: 122 mg/dL — ABNORMAL HIGH (ref 65–99)
Glucose, Bld: 117 mg/dL — ABNORMAL HIGH (ref 65–99)
Glucose, Bld: 113 mg/dL — ABNORMAL HIGH (ref 65–99)
Glucose, Bld: 103 mg/dL — ABNORMAL HIGH (ref 65–99)
HCT: 25 % — ABNORMAL LOW (ref 36.0–46.0)
HCT: 25 % — ABNORMAL LOW (ref 36.0–46.0)
HEMATOCRIT: 35 % — AB (ref 36.0–46.0)
HEMATOCRIT: 33 % — AB (ref 36.0–46.0)
HEMATOCRIT: 24 % — AB (ref 36.0–46.0)
HEMATOCRIT: 29 % — AB (ref 36.0–46.0)
HEMOGLOBIN: 11.2 g/dL — AB (ref 12.0–15.0)
Hemoglobin: 11.9 g/dL — ABNORMAL LOW (ref 12.0–15.0)
Hemoglobin: 8.2 g/dL — ABNORMAL LOW (ref 12.0–15.0)
Hemoglobin: 8.5 g/dL — ABNORMAL LOW (ref 12.0–15.0)
Hemoglobin: 8.5 g/dL — ABNORMAL LOW (ref 12.0–15.0)
Hemoglobin: 9.9 g/dL — ABNORMAL LOW (ref 12.0–15.0)
POTASSIUM: 3.6 mmol/L (ref 3.5–5.1)
POTASSIUM: 4.5 mmol/L (ref 3.5–5.1)
POTASSIUM: 4.7 mmol/L (ref 3.5–5.1)
POTASSIUM: 4.1 mmol/L (ref 3.5–5.1)
POTASSIUM: 4.7 mmol/L (ref 3.5–5.1)
Potassium: 3.9 mmol/L (ref 3.5–5.1)
SODIUM: 142 mmol/L (ref 135–145)
SODIUM: 143 mmol/L (ref 135–145)
Sodium: 143 mmol/L (ref 135–145)
Sodium: 143 mmol/L (ref 135–145)
Sodium: 145 mmol/L (ref 135–145)
Sodium: 145 mmol/L (ref 135–145)
TCO2: 27 mmol/L (ref 22–32)
TCO2: 25 mmol/L (ref 22–32)
TCO2: 24 mmol/L (ref 22–32)
TCO2: 25 mmol/L (ref 22–32)
TCO2: 23 mmol/L (ref 22–32)
TCO2: 24 mmol/L (ref 22–32)

## 2017-12-12 LAB — CBC
HCT: 31 % — ABNORMAL LOW (ref 36.0–46.0)
HEMATOCRIT: 31.4 % — AB (ref 36.0–46.0)
Hemoglobin: 9.9 g/dL — ABNORMAL LOW (ref 12.0–15.0)
Hemoglobin: 9.8 g/dL — ABNORMAL LOW (ref 12.0–15.0)
MCH: 29.8 pg (ref 26.0–34.0)
MCH: 29.3 pg (ref 26.0–34.0)
MCHC: 31.9 g/dL (ref 30.0–36.0)
MCHC: 31.2 g/dL (ref 30.0–36.0)
MCV: 93.4 fL (ref 78.0–100.0)
MCV: 94 fL (ref 78.0–100.0)
PLATELETS: 168 10*3/uL (ref 150–400)
Platelets: 182 10*3/uL (ref 150–400)
RBC: 3.32 MIL/uL — AB (ref 3.87–5.11)
RBC: 3.34 MIL/uL — AB (ref 3.87–5.11)
RDW: 14.8 % (ref 11.5–15.5)
RDW: 15 % (ref 11.5–15.5)
WBC: 15.5 10*3/uL — AB (ref 4.0–10.5)
WBC: 16.4 10*3/uL — ABNORMAL HIGH (ref 4.0–10.5)

## 2017-12-12 LAB — GLUCOSE, CAPILLARY
GLUCOSE-CAPILLARY: 95 mg/dL (ref 65–99)
GLUCOSE-CAPILLARY: 113 mg/dL — AB (ref 65–99)
GLUCOSE-CAPILLARY: 123 mg/dL — AB (ref 65–99)
GLUCOSE-CAPILLARY: 99 mg/dL (ref 65–99)
Glucose-Capillary: 132 mg/dL — ABNORMAL HIGH (ref 65–99)
Glucose-Capillary: 118 mg/dL — ABNORMAL HIGH (ref 65–99)
Glucose-Capillary: 104 mg/dL — ABNORMAL HIGH (ref 65–99)
Glucose-Capillary: 90 mg/dL (ref 65–99)
Glucose-Capillary: 112 mg/dL — ABNORMAL HIGH (ref 65–99)
Glucose-Capillary: 98 mg/dL (ref 65–99)
Glucose-Capillary: 102 mg/dL — ABNORMAL HIGH (ref 65–99)
Glucose-Capillary: 104 mg/dL — ABNORMAL HIGH (ref 65–99)

## 2017-12-12 LAB — PROTIME-INR
INR: 1.3
PROTHROMBIN TIME: 16.1 s — AB (ref 11.4–15.2)

## 2017-12-12 LAB — POCT I-STAT 4, (NA,K, GLUC, HGB,HCT)
Glucose, Bld: 125 mg/dL — ABNORMAL HIGH (ref 65–99)
HCT: 29 % — ABNORMAL LOW (ref 36.0–46.0)
Hemoglobin: 9.9 g/dL — ABNORMAL LOW (ref 12.0–15.0)
Potassium: 3.7 mmol/L (ref 3.5–5.1)
SODIUM: 146 mmol/L — AB (ref 135–145)

## 2017-12-12 LAB — PLATELET COUNT: Platelets: 165 10*3/uL (ref 150–400)

## 2017-12-12 LAB — CREATININE, SERUM
Creatinine, Ser: 0.6 mg/dL (ref 0.44–1.00)
GFR calc non Af Amer: >60 mL/min (ref >60)

## 2017-12-12 LAB — HEMOGLOBIN AND HEMATOCRIT, BLOOD
HCT: 24.8 % — ABNORMAL LOW (ref 36.0–46.0)
HEMOGLOBIN: 7.9 g/dL — AB (ref 12.0–15.0)

## 2017-12-12 LAB — MAGNESIUM: MAGNESIUM: 3.7 mg/dL — AB (ref 1.7–2.4)

## 2017-12-12 LAB — APTT: APTT: 29 s (ref 24–36)

## 2017-12-12 SURGERY — CORONARY ARTERY BYPASS GRAFTING (CABG)
Anesthesia: General | Site: Chest

## 2017-12-12 MED ORDER — ARTIFICIAL TEARS OPHTHALMIC OINT
TOPICAL_OINTMENT | OPHTHALMIC | Status: DC | PRN
  Administered 2017-12-12: 1 via OPHTHALMIC

## 2017-12-12 MED ORDER — DOCUSATE SODIUM 100 MG PO CAPS
200.0000 mg | ORAL_CAPSULE | Freq: Every day | ORAL | Status: DC
  Administered 2017-12-13 – 2017-12-17 (×4): 200 mg via ORAL
  Filled 2017-12-12 (×5): qty 2

## 2017-12-12 MED ORDER — MIDAZOLAM HCL 2 MG/2ML IJ SOLN
INTRAMUSCULAR | Status: AC
  Filled 2017-12-12: qty 2

## 2017-12-12 MED ORDER — ACETAMINOPHEN 650 MG RE SUPP
650.0000 mg | Freq: Once | RECTAL | Status: AC
  Administered 2017-12-12: 650 mg via RECTAL

## 2017-12-12 MED ORDER — PROPOFOL 10 MG/ML IV BOLUS
INTRAVENOUS | Status: AC
  Filled 2017-12-12: qty 20

## 2017-12-12 MED ORDER — LACTATED RINGERS IV SOLN: 500.0000 mL | Freq: Once | INTRAVENOUS | Status: DC | PRN

## 2017-12-12 MED ORDER — ALBUMIN HUMAN 5 % IV SOLN
250.0000 mL | INTRAVENOUS | Status: AC | PRN
  Administered 2017-12-12 (×3): 250 mL via INTRAVENOUS
  Filled 2017-12-12: qty 250

## 2017-12-12 MED ORDER — ASPIRIN 81 MG PO CHEW: 324.0000 mg | CHEWABLE_TABLET | Freq: Every day | ORAL | Status: DC

## 2017-12-12 MED ORDER — ACETAMINOPHEN 500 MG PO TABS
1000.0000 mg | ORAL_TABLET | Freq: Four times a day (QID) | ORAL | Status: DC
  Administered 2017-12-13 – 2017-12-17 (×16): 1000 mg via ORAL
  Filled 2017-12-12 (×16): qty 2

## 2017-12-12 MED ORDER — SODIUM BICARBONATE 8.4 % IV SOLN
50.0000 meq | Freq: Once | INTRAVENOUS | Status: AC
  Administered 2017-12-12: 50 meq via INTRAVENOUS

## 2017-12-12 MED ORDER — ALBUMIN HUMAN 5 % IV SOLN
INTRAVENOUS | Status: DC | PRN
  Administered 2017-12-12 (×2): via INTRAVENOUS

## 2017-12-12 MED ORDER — PANTOPRAZOLE SODIUM 40 MG PO TBEC
40.0000 mg | DELAYED_RELEASE_TABLET | Freq: Every day | ORAL | Status: DC
  Administered 2017-12-14 – 2017-12-17 (×4): 40 mg via ORAL
  Filled 2017-12-12 (×4): qty 1

## 2017-12-12 MED ORDER — METOPROLOL TARTRATE 12.5 MG HALF TABLET: 12.5000 mg | ORAL_TABLET | Freq: Two times a day (BID) | ORAL | Status: DC

## 2017-12-12 MED ORDER — LACTATED RINGERS IV SOLN: INTRAVENOUS | Status: DC

## 2017-12-12 MED ORDER — MIDAZOLAM HCL 5 MG/5ML IJ SOLN
INTRAMUSCULAR | Status: DC | PRN
  Administered 2017-12-12 (×5): 2 mg via INTRAVENOUS
  Administered 2017-12-12 (×2): 1 mg via INTRAVENOUS
  Administered 2017-12-12: 2 mg via INTRAVENOUS

## 2017-12-12 MED ORDER — HEPARIN SODIUM (PORCINE) 1000 UNIT/ML IJ SOLN
INTRAMUSCULAR | Status: DC | PRN
  Administered 2017-12-12: 5000 [IU] via INTRAVENOUS
  Administered 2017-12-12: 24000 [IU] via INTRAVENOUS

## 2017-12-12 MED ORDER — METOPROLOL TARTRATE 12.5 MG HALF TABLET
12.5000 mg | ORAL_TABLET | Freq: Once | ORAL | Status: DC
  Filled 2017-12-12: qty 1

## 2017-12-12 MED ORDER — CEFUROXIME SODIUM 1.5 G IV SOLR
1.5000 g | Freq: Two times a day (BID) | INTRAVENOUS | Status: AC
  Administered 2017-12-12 – 2017-12-14 (×4): 1.5 g via INTRAVENOUS
  Filled 2017-12-12 (×4): qty 1.5

## 2017-12-12 MED ORDER — SODIUM CHLORIDE 0.9 % IV SOLN: 250.0000 mL | INTRAVENOUS | Status: DC

## 2017-12-12 MED ORDER — CHLORHEXIDINE GLUCONATE 0.12 % MT SOLN
15.0000 mL | Freq: Once | OROMUCOSAL | Status: AC
  Administered 2017-12-12: 15 mL via OROMUCOSAL
  Filled 2017-12-12: qty 15

## 2017-12-12 MED ORDER — FAMOTIDINE IN NACL 20-0.9 MG/50ML-% IV SOLN
20.0000 mg | Freq: Two times a day (BID) | INTRAVENOUS | Status: AC
  Administered 2017-12-12 (×2): 20 mg via INTRAVENOUS
  Filled 2017-12-12: qty 50

## 2017-12-12 MED ORDER — MORPHINE SULFATE (PF) 2 MG/ML IV SOLN
1.0000 mg | INTRAVENOUS | Status: DC | PRN
  Administered 2017-12-13 (×7): 2 mg via INTRAVENOUS
  Filled 2017-12-12 (×9): qty 1

## 2017-12-12 MED ORDER — ROCURONIUM BROMIDE 10 MG/ML (PF) SYRINGE
PREFILLED_SYRINGE | INTRAVENOUS | Status: AC
  Filled 2017-12-12: qty 5

## 2017-12-12 MED ORDER — SODIUM CHLORIDE 0.9 % IV SOLN
INTRAVENOUS | Status: DC | PRN
  Administered 2017-12-12: 750 mg via INTRAVENOUS

## 2017-12-12 MED ORDER — ONDANSETRON HCL 4 MG/2ML IJ SOLN: 4.0000 mg | Freq: Four times a day (QID) | INTRAMUSCULAR | Status: DC | PRN

## 2017-12-12 MED ORDER — SODIUM CHLORIDE 0.9% FLUSH
3.0000 mL | Freq: Two times a day (BID) | INTRAVENOUS | Status: DC
  Administered 2017-12-13 – 2017-12-16 (×6): 3 mL via INTRAVENOUS

## 2017-12-12 MED ORDER — SODIUM CHLORIDE 0.9 % IJ SOLN
INTRAMUSCULAR | Status: AC
  Filled 2017-12-12: qty 40

## 2017-12-12 MED ORDER — INSULIN REGULAR BOLUS VIA INFUSION
0.0000 [IU] | Freq: Three times a day (TID) | INTRAVENOUS | Status: DC
  Filled 2017-12-12: qty 10

## 2017-12-12 MED ORDER — HEPARIN SODIUM (PORCINE) 1000 UNIT/ML IJ SOLN
INTRAMUSCULAR | Status: AC
  Filled 2017-12-12: qty 1

## 2017-12-12 MED ORDER — ACETAMINOPHEN 160 MG/5ML PO SOLN: 650.0000 mg | Freq: Once | ORAL | Status: AC

## 2017-12-12 MED ORDER — LACTATED RINGERS IV SOLN
INTRAVENOUS | Status: DC | PRN
  Administered 2017-12-12 (×2): via INTRAVENOUS

## 2017-12-12 MED ORDER — PHENYLEPHRINE 40 MCG/ML (10ML) SYRINGE FOR IV PUSH (FOR BLOOD PRESSURE SUPPORT)
PREFILLED_SYRINGE | INTRAVENOUS | Status: AC
  Filled 2017-12-12: qty 10

## 2017-12-12 MED ORDER — METOPROLOL TARTRATE 25 MG/10 ML ORAL SUSPENSION: 12.5000 mg | Freq: Two times a day (BID) | ORAL | Status: DC

## 2017-12-12 MED ORDER — MORPHINE SULFATE (PF) 2 MG/ML IV SOLN
1.0000 mg | INTRAVENOUS | Status: DC | PRN
  Administered 2017-12-12 (×3): 2 mg via INTRAVENOUS
  Filled 2017-12-12: qty 1

## 2017-12-12 MED ORDER — SODIUM CHLORIDE 0.9 % IV SOLN
INTRAVENOUS | Status: DC
  Administered 2017-12-12: 20:00:00 via INTRAVENOUS

## 2017-12-12 MED ORDER — BISACODYL 5 MG PO TBEC
10.0000 mg | DELAYED_RELEASE_TABLET | Freq: Every day | ORAL | Status: DC
  Administered 2017-12-13 – 2017-12-15 (×3): 10 mg via ORAL
  Filled 2017-12-12 (×5): qty 2

## 2017-12-12 MED ORDER — METOPROLOL TARTRATE 5 MG/5ML IV SOLN: 2.5000 mg | INTRAVENOUS | Status: DC | PRN

## 2017-12-12 MED ORDER — EPHEDRINE SULFATE 50 MG/ML IJ SOLN
INTRAMUSCULAR | Status: AC
  Filled 2017-12-12: qty 1

## 2017-12-12 MED ORDER — PROTAMINE SULFATE 10 MG/ML IV SOLN
INTRAVENOUS | Status: AC
  Filled 2017-12-12: qty 25

## 2017-12-12 MED ORDER — PROPOFOL 10 MG/ML IV BOLUS
INTRAVENOUS | Status: DC | PRN
  Administered 2017-12-12: 50 mg via INTRAVENOUS

## 2017-12-12 MED ORDER — PHENYLEPHRINE 40 MCG/ML (10ML) SYRINGE FOR IV PUSH (FOR BLOOD PRESSURE SUPPORT)
PREFILLED_SYRINGE | INTRAVENOUS | Status: DC | PRN
  Administered 2017-12-12 (×3): 40 ug via INTRAVENOUS

## 2017-12-12 MED ORDER — MAGNESIUM SULFATE 4 GM/100ML IV SOLN
4.0000 g | Freq: Once | INTRAVENOUS | Status: AC
  Administered 2017-12-12: 4 g via INTRAVENOUS
  Filled 2017-12-12: qty 100

## 2017-12-12 MED ORDER — LACTATED RINGERS IV SOLN
INTRAVENOUS | Status: DC | PRN
  Administered 2017-12-12: 06:00:00 via INTRAVENOUS

## 2017-12-12 MED ORDER — MIDAZOLAM HCL 10 MG/2ML IJ SOLN
INTRAMUSCULAR | Status: AC
  Filled 2017-12-12: qty 2

## 2017-12-12 MED ORDER — LACTATED RINGERS IV SOLN
INTRAVENOUS | Status: DC | PRN
  Administered 2017-12-12: 07:00:00 via INTRAVENOUS

## 2017-12-12 MED ORDER — SODIUM CHLORIDE 0.9% FLUSH: 3.0000 mL | INTRAVENOUS | Status: DC | PRN

## 2017-12-12 MED ORDER — DEXMEDETOMIDINE HCL IN NACL 200 MCG/50ML IV SOLN
0.0000 ug/kg/h | INTRAVENOUS | Status: DC
  Administered 2017-12-12: 0.2 ug/kg/h via INTRAVENOUS
  Filled 2017-12-12: qty 50

## 2017-12-12 MED ORDER — ROCURONIUM BROMIDE 10 MG/ML (PF) SYRINGE
PREFILLED_SYRINGE | INTRAVENOUS | Status: DC | PRN
  Administered 2017-12-12: 40 mg via INTRAVENOUS
  Administered 2017-12-12: 50 mg via INTRAVENOUS
  Administered 2017-12-12: 60 mg via INTRAVENOUS
  Administered 2017-12-12: 50 mg via INTRAVENOUS

## 2017-12-12 MED ORDER — SODIUM CHLORIDE 0.9 % IV SOLN
0.0000 ug/min | INTRAVENOUS | Status: DC
  Administered 2017-12-12: 40 ug/min via INTRAVENOUS
  Administered 2017-12-13: 20 ug/min via INTRAVENOUS
  Filled 2017-12-12 (×2): qty 20

## 2017-12-12 MED ORDER — CHLORHEXIDINE GLUCONATE 0.12 % MT SOLN
15.0000 mL | OROMUCOSAL | Status: AC
  Administered 2017-12-12: 15 mL via OROMUCOSAL

## 2017-12-12 MED ORDER — HEMOSTATIC AGENTS (NO CHARGE) OPTIME
TOPICAL | Status: DC | PRN
  Administered 2017-12-12 (×3): 1 via TOPICAL

## 2017-12-12 MED ORDER — 0.9 % SODIUM CHLORIDE (POUR BTL) OPTIME
TOPICAL | Status: DC | PRN
  Administered 2017-12-12 (×5): 1000 mL

## 2017-12-12 MED ORDER — ASPIRIN EC 325 MG PO TBEC: 325.0000 mg | DELAYED_RELEASE_TABLET | Freq: Every day | ORAL | Status: DC

## 2017-12-12 MED ORDER — FENTANYL CITRATE (PF) 250 MCG/5ML IJ SOLN
INTRAMUSCULAR | Status: AC
  Filled 2017-12-12: qty 25

## 2017-12-12 MED ORDER — PHENYLEPHRINE HCL 10 MG/ML IJ SOLN
INTRAMUSCULAR | Status: DC | PRN
  Administered 2017-12-12: 25 ug/min via INTRAVENOUS

## 2017-12-12 MED ORDER — FENTANYL CITRATE (PF) 250 MCG/5ML IJ SOLN
INTRAMUSCULAR | Status: DC | PRN
  Administered 2017-12-12: 50 ug via INTRAVENOUS
  Administered 2017-12-12: 100 ug via INTRAVENOUS
  Administered 2017-12-12: 650 ug via INTRAVENOUS
  Administered 2017-12-12: 100 ug via INTRAVENOUS
  Administered 2017-12-12: 150 ug via INTRAVENOUS
  Administered 2017-12-12 (×2): 100 ug via INTRAVENOUS

## 2017-12-12 MED ORDER — PROTAMINE SULFATE 10 MG/ML IV SOLN
INTRAVENOUS | Status: DC | PRN
  Administered 2017-12-12: 240 mg via INTRAVENOUS

## 2017-12-12 MED ORDER — SODIUM CHLORIDE 0.9 % IV SOLN
INTRAVENOUS | Status: DC
  Filled 2017-12-12: qty 1

## 2017-12-12 MED ORDER — POTASSIUM CHLORIDE 10 MEQ/50ML IV SOLN
10.0000 meq | INTRAVENOUS | Status: AC
  Administered 2017-12-12 (×3): 10 meq via INTRAVENOUS

## 2017-12-12 MED ORDER — ACETAMINOPHEN 160 MG/5ML PO SOLN
1000.0000 mg | Freq: Four times a day (QID) | ORAL | Status: DC
  Administered 2017-12-12 – 2017-12-13 (×2): 1000 mg
  Filled 2017-12-12 (×2): qty 40.6

## 2017-12-12 MED ORDER — NITROGLYCERIN IN D5W 200-5 MCG/ML-% IV SOLN: 0.0000 ug/min | INTRAVENOUS | Status: DC

## 2017-12-12 MED ORDER — BISACODYL 10 MG RE SUPP: 10.0000 mg | Freq: Every day | RECTAL | Status: DC

## 2017-12-12 MED ORDER — SODIUM CHLORIDE 0.45 % IV SOLN
INTRAVENOUS | Status: DC | PRN
  Administered 2017-12-12: 20 mL/h via INTRAVENOUS

## 2017-12-12 MED ORDER — OXYCODONE HCL 5 MG PO TABS
5.0000 mg | ORAL_TABLET | ORAL | Status: DC | PRN
  Administered 2017-12-13 – 2017-12-17 (×13): 10 mg via ORAL
  Filled 2017-12-12 (×13): qty 2

## 2017-12-12 MED ORDER — CHLORHEXIDINE GLUCONATE 4 % EX LIQD: 30.0000 mL | CUTANEOUS | Status: DC

## 2017-12-12 MED ORDER — SODIUM CHLORIDE 0.9 % IV SOLN
INTRAVENOUS | Status: DC | PRN
  Administered 2017-12-12: 11:00:00 via INTRAVENOUS

## 2017-12-12 MED ORDER — MIDAZOLAM HCL 2 MG/2ML IJ SOLN: 2.0000 mg | INTRAMUSCULAR | Status: DC | PRN

## 2017-12-12 MED ORDER — SODIUM CHLORIDE 0.9 % IV SOLN
INTRAVENOUS | Status: AC
  Administered 2017-12-12: 12:00:00 via INTRAVENOUS

## 2017-12-12 MED ORDER — TRAMADOL HCL 50 MG PO TABS
50.0000 mg | ORAL_TABLET | ORAL | Status: DC | PRN
  Administered 2017-12-13 – 2017-12-17 (×8): 100 mg via ORAL
  Filled 2017-12-12 (×8): qty 2

## 2017-12-12 MED ORDER — VANCOMYCIN HCL IN DEXTROSE 1-5 GM/200ML-% IV SOLN
1000.0000 mg | Freq: Once | INTRAVENOUS | Status: AC
  Administered 2017-12-12: 1000 mg via INTRAVENOUS
  Filled 2017-12-12: qty 200

## 2017-12-12 SURGICAL SUPPLY — 109 items
BAG DECANTER FOR FLEXI CONT (MISCELLANEOUS) ×6 IMPLANT
BANDAGE ACE 4X5 VEL STRL LF (GAUZE/BANDAGES/DRESSINGS) ×3 IMPLANT
BANDAGE ACE 6X5 VEL STRL LF (GAUZE/BANDAGES/DRESSINGS) ×3 IMPLANT
BASKET HEART (ORDER IN 25'S) (MISCELLANEOUS) ×1
BASKET HEART (ORDER IN 25S) (MISCELLANEOUS) ×2 IMPLANT
BLADE CLIPPER SURG (BLADE) IMPLANT
BLADE STERNUM SYSTEM 6 (BLADE) ×3 IMPLANT
BLADE SURG 11 STRL SS (BLADE) ×3 IMPLANT
BNDG GAUZE ELAST 4 BULKY (GAUZE/BANDAGES/DRESSINGS) ×3 IMPLANT
CANISTER SUCT 3000ML PPV (MISCELLANEOUS) ×3 IMPLANT
CANNULA EZ GLIDE AORTIC 21FR (CANNULA) ×6 IMPLANT
CATH CPB KIT OWEN (MISCELLANEOUS) ×3 IMPLANT
CATH THORACIC 36FR (CATHETERS) ×3 IMPLANT
CLIP RETRACTION 3.0MM CORONARY (MISCELLANEOUS) ×3 IMPLANT
CLIP VESOCCLUDE MED 24/CT (CLIP) IMPLANT
CLIP VESOCCLUDE SM WIDE 24/CT (CLIP) IMPLANT
CONN ST 1/4X3/8  BEN (MISCELLANEOUS) ×1
CONN ST 1/4X3/8 BEN (MISCELLANEOUS) ×2 IMPLANT
CRADLE DONUT ADULT HEAD (MISCELLANEOUS) ×3 IMPLANT
DERMABOND ADVANCED (GAUZE/BANDAGES/DRESSINGS) ×1
DERMABOND ADVANCED .7 DNX12 (GAUZE/BANDAGES/DRESSINGS) ×2 IMPLANT
DRAIN CHANNEL 32F RND 10.7 FF (WOUND CARE) ×6 IMPLANT
DRAPE CARDIOVASCULAR INCISE (DRAPES) ×1
DRAPE INCISE IOBAN 66X45 STRL (DRAPES) ×3 IMPLANT
DRAPE SLUSH/WARMER DISC (DRAPES) ×3 IMPLANT
DRAPE SRG 135X102X78XABS (DRAPES) ×2 IMPLANT
DRSG AQUACEL AG ADV 3.5X14 (GAUZE/BANDAGES/DRESSINGS) ×3 IMPLANT
DRSG COVADERM 4X14 (GAUZE/BANDAGES/DRESSINGS) ×3 IMPLANT
ELECT BLADE 4.0 EZ CLEAN MEGAD (MISCELLANEOUS) ×3
ELECT REM PT RETURN 9FT ADLT (ELECTROSURGICAL) ×6
ELECTRODE BLDE 4.0 EZ CLN MEGD (MISCELLANEOUS) ×2 IMPLANT
ELECTRODE REM PT RTRN 9FT ADLT (ELECTROSURGICAL) ×4 IMPLANT
FELT TEFLON 1X6 (MISCELLANEOUS) ×3 IMPLANT
GAUZE SPONGE 4X4 12PLY STRL (GAUZE/BANDAGES/DRESSINGS) ×6 IMPLANT
GAUZE SPONGE 4X4 12PLY STRL LF (GAUZE/BANDAGES/DRESSINGS) ×6 IMPLANT
GLOVE BIO SURGEON STRL SZ 6.5 (GLOVE) ×6 IMPLANT
GLOVE BIOGEL PI IND STRL 6.5 (GLOVE) ×4 IMPLANT
GLOVE BIOGEL PI IND STRL 8 (GLOVE) ×2 IMPLANT
GLOVE BIOGEL PI IND STRL 8.5 (GLOVE) ×2 IMPLANT
GLOVE BIOGEL PI INDICATOR 6.5 (GLOVE) ×2
GLOVE BIOGEL PI INDICATOR 8 (GLOVE) ×1
GLOVE BIOGEL PI INDICATOR 8.5 (GLOVE) ×1
GLOVE ORTHO TXT STRL SZ7.5 (GLOVE) ×6 IMPLANT
GOWN STRL REUS W/ TWL LRG LVL3 (GOWN DISPOSABLE) ×20 IMPLANT
GOWN STRL REUS W/TWL LRG LVL3 (GOWN DISPOSABLE) ×10
HEMOSTAT POWDER SURGIFOAM 1G (HEMOSTASIS) IMPLANT
HEMOSTAT SURGICEL 2X14 (HEMOSTASIS) ×3 IMPLANT
INSERT FOGARTY XLG (MISCELLANEOUS) ×3 IMPLANT
KIT BASIN OR (CUSTOM PROCEDURE TRAY) ×3 IMPLANT
KIT SUCTION CATH 14FR (SUCTIONS) ×9 IMPLANT
KIT TURNOVER KIT B (KITS) ×3 IMPLANT
KIT VASOVIEW HEMOPRO VH 3000 (KITS) ×3 IMPLANT
LEAD PACING MYOCARDI (MISCELLANEOUS) ×3 IMPLANT
LINE EXTENSION DELIVERY (MISCELLANEOUS) ×3 IMPLANT
MARKER GRAFT CORONARY BYPASS (MISCELLANEOUS) ×9 IMPLANT
NS IRRIG 1000ML POUR BTL (IV SOLUTION) ×15 IMPLANT
PACK E OPEN HEART (SUTURE) ×3 IMPLANT
PACK OPEN HEART (CUSTOM PROCEDURE TRAY) ×3 IMPLANT
PAD ARMBOARD 7.5X6 YLW CONV (MISCELLANEOUS) ×6 IMPLANT
PAD ELECT DEFIB RADIOL ZOLL (MISCELLANEOUS) ×3 IMPLANT
PENCIL BUTTON HOLSTER BLD 10FT (ELECTRODE) ×3 IMPLANT
POWDER SURGICEL 3.0 GRAM (HEMOSTASIS) ×6 IMPLANT
PUNCH AORTIC ROTATE 4.0MM (MISCELLANEOUS) ×3 IMPLANT
PUNCH AORTIC ROTATE 4.5MM 8IN (MISCELLANEOUS) IMPLANT
PUNCH AORTIC ROTATE 5MM 8IN (MISCELLANEOUS) IMPLANT
SET CARDIOPLEGIA MPS 5001102 (MISCELLANEOUS) ×3 IMPLANT
SOLUTION ANTI FOG 6CC (MISCELLANEOUS) IMPLANT
SPONGE LAP 18X18 X RAY DECT (DISPOSABLE) ×3 IMPLANT
SPONGE LAP 4X18 RFD (DISPOSABLE) ×3 IMPLANT
SUT BONE WAX W31G (SUTURE) ×3 IMPLANT
SUT ETHIBOND X763 2 0 SH 1 (SUTURE) ×6 IMPLANT
SUT MNCRL AB 3-0 PS2 18 (SUTURE) ×6 IMPLANT
SUT MNCRL AB 4-0 PS2 18 (SUTURE) IMPLANT
SUT PDS AB 1 CTX 36 (SUTURE) ×6 IMPLANT
SUT PROLENE 2 0 SH DA (SUTURE) IMPLANT
SUT PROLENE 3 0 SH DA (SUTURE) ×9 IMPLANT
SUT PROLENE 3 0 SH1 36 (SUTURE) IMPLANT
SUT PROLENE 4 0 RB 1 (SUTURE) ×3
SUT PROLENE 4 0 SH DA (SUTURE) IMPLANT
SUT PROLENE 4-0 RB1 .5 CRCL 36 (SUTURE) ×6 IMPLANT
SUT PROLENE 5 0 C 1 36 (SUTURE) IMPLANT
SUT PROLENE 6 0 C 1 30 (SUTURE) IMPLANT
SUT PROLENE 7.0 RB 3 (SUTURE) ×9 IMPLANT
SUT PROLENE 8 0 BV175 6 (SUTURE) ×24 IMPLANT
SUT PROLENE BLUE 7 0 (SUTURE) ×3 IMPLANT
SUT PROLENE POLY MONO (SUTURE) IMPLANT
SUT SILK  1 MH (SUTURE) ×1
SUT SILK 1 MH (SUTURE) ×2 IMPLANT
SUT STEEL 6MS V (SUTURE) IMPLANT
SUT STEEL STERNAL CCS#1 18IN (SUTURE) IMPLANT
SUT STEEL SZ 6 DBL 3X14 BALL (SUTURE) IMPLANT
SUT VIC AB 1 CTX 36 (SUTURE)
SUT VIC AB 1 CTX36XBRD ANBCTR (SUTURE) IMPLANT
SUT VIC AB 2-0 CT1 27 (SUTURE) ×1
SUT VIC AB 2-0 CT1 TAPERPNT 27 (SUTURE) ×2 IMPLANT
SUT VIC AB 2-0 CTX 27 (SUTURE) ×3 IMPLANT
SUT VIC AB 3-0 SH 27 (SUTURE)
SUT VIC AB 3-0 SH 27X BRD (SUTURE) IMPLANT
SUT VIC AB 3-0 X1 27 (SUTURE) IMPLANT
SUT VICRYL 4-0 PS2 18IN ABS (SUTURE) ×3 IMPLANT
SYSTEM SAHARA CHEST DRAIN ATS (WOUND CARE) ×3 IMPLANT
TAPE CLOTH SURG 4X10 WHT LF (GAUZE/BANDAGES/DRESSINGS) ×6 IMPLANT
TAPE PAPER 2X10 WHT MICROPORE (GAUZE/BANDAGES/DRESSINGS) ×3 IMPLANT
TOWEL GREEN STERILE (TOWEL DISPOSABLE) ×3 IMPLANT
TOWEL GREEN STERILE FF (TOWEL DISPOSABLE) ×3 IMPLANT
TRAY FOLEY SLVR 16FR TEMP STAT (SET/KITS/TRAYS/PACK) ×3 IMPLANT
TUBING INSUFFLATION (TUBING) ×3 IMPLANT
UNDERPAD 30X30 (UNDERPADS AND DIAPERS) ×3 IMPLANT
WATER STERILE IRR 1000ML POUR (IV SOLUTION) ×6 IMPLANT

## 2017-12-12 NOTE — Anesthesia Procedure Notes (Addendum)
Central Venous Catheter Insertion Performed by: Roberts Gaudy, MD, anesthesiologist Start/End6/12/2017 6:30 AM, 12/12/2017 6:40 AM Patient location: Pre-op. Preanesthetic checklist: patient identified, IV checked, site marked, risks and benefits discussed, surgical consent, monitors and equipment checked, pre-op evaluation, timeout performed and anesthesia consent Lidocaine 1% used for infiltration and patient sedated Hand hygiene performed  and maximum sterile barriers used  Catheter size: 8.5 Fr Sheath introducer Procedure performed using ultrasound guided technique. Ultrasound Notes:anatomy identified, needle tip was noted to be adjacent to the nerve/plexus identified, no ultrasound evidence of intravascular and/or intraneural injection and image(s) printed for medical record Attempts: 1 Following insertion, line sutured and dressing applied. Post procedure assessment: blood return through all ports, free fluid flow and no air  Patient tolerated the procedure well with no immediate complications.

## 2017-12-12 NOTE — Progress Notes (Signed)
Paged MD regarding pt's ABG of  Ph 3.1 CO2 50 PO2 108, HCO3 25. Vent settings remain at 50% FIO2 PEEP 5 RR 16 Tidal volume 500.  Since the rate was still increased and CO2 remains elevated, MD ordered to keep pt intubated overnight and reassess weaning parameters in am.  Pt now calm and resting.  Will continue to monitor.

## 2017-12-12 NOTE — Anesthesia Procedure Notes (Signed)
Central Venous Catheter Insertion Performed by: Roberts Gaudy, MD, anesthesiologist Start/End6/12/2017 6:30 AM, 12/12/2017 6:40 AM Patient location: Pre-op. Preanesthetic checklist: patient identified, IV checked, site marked, risks and benefits discussed, surgical consent, monitors and equipment checked, pre-op evaluation, timeout performed and anesthesia consent Hand hygiene performed  and maximum sterile barriers used  PA cath was placed.Swan type:thermodilution Procedure performed using ultrasound guided technique. Ultrasound Notes:anatomy identified, needle tip was noted to be adjacent to the nerve/plexus identified, no ultrasound evidence of intravascular and/or intraneural injection and image(s) printed for medical record Attempts: 1 Following insertion, line sutured, dressing applied and Biopatch. Post procedure assessment: blood return through all ports  Patient tolerated the procedure well with no immediate complications.

## 2017-12-12 NOTE — Progress Notes (Signed)
Rapid weaning protocol 

## 2017-12-12 NOTE — Progress Notes (Signed)
RR increased to 20 per MD based on ABG results.

## 2017-12-12 NOTE — Brief Op Note (Signed)
12/12/2017  11:36 AM  PATIENT:  Melissa Rubio  47 y.o. female  PRE-OPERATIVE DIAGNOSIS:  CAD  POST-OPERATIVE DIAGNOSIS:  CAD  PROCEDURE: TRANSESOPHAGEAL ECHOCARDIOGRAM (TEE),MEDIAN STERNOTOMY for CORONARY ARTERY BYPASS GRAFTING (CABG) x 2 (LIMA to LAD, SVG to OM) using RIGHT THIGH GREATER SAPHENOUS VEIN and LEFT INTERNAL MAMMARY ARTERY  SURGEON:  Surgeon(s) and Role:    Rexene Alberts, MD - Primary  PHYSICIAN ASSISTANT: Lars Pinks PA-C  ANESTHESIA:   general  EBL:  650   DRAINS: Chest tubes placed in the mediastinal and pleural spaces   COUNTS CORRECT:  YES  DICTATION: .Dragon Dictation  PLAN OF CARE: Admit to inpatient   PATIENT DISPOSITION:  ICU - intubated and hemodynamically stable.   Delay start of Pharmacological VTE agent (>24hrs) due to surgical blood loss or risk of bleeding: yes  BASELINE WEIGHT: 78 kg

## 2017-12-12 NOTE — Op Note (Signed)
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 Roxy Manns, MD  Assistant: Nani Skillern, PA-C  Anesthesia: Arabella Merles, MD  Operative Findings:  Normal left ventricular systolic function  Good quality left internal mammary artery conduit  Small caliber but otherwise good quality saphenous vein conduit  Fair quality target vessels for grafting    BRIEF CLINICAL NOTE AND INDICATIONS FOR SURGERY  Patient is a 47 year old moderately obese female with history of hypertension, type 2 diabetes mellitus, hyperlipidemia, long-standing tobacco abuse, and a strong family history of coronary artery disease who recently presented with an acute inferior wall ST segment elevation myocardial infarction, was treated with PCI and stenting of the right coronary artery, and has now been referred for elective surgical consultation to discuss treatment options for management of left main and severe three-vessel coronary artery disease.  The patient states that she was in her usual state of health until early February of this year when she began to experience episodes of substernal chest discomfort described as a burning pain which she initially attributed to indigestion. Symptoms were frequent and not necessarily related to physical activity or meals. On the morning of September 01, 2017 the patient developed similar symptoms of burning substernal chest pain associated with shortness of breath that persisted. She presented to the emergency department where she was diagnosed with  acute evolving inferior wall ST segment elevation myocardial infarction.She was taken directly to the Cath Lab by Dr. Martinique where she was found to have 100% occlusion of the mid right coronary artery. She was treated with PCI and stenting using a single drug-eluting stent in the mid right coronary artery. She was also found to have 50% left main coronary artery stenosis with ostial stenosis of the left anterior descending coronary artery and the left circumflex coronary artery. Follow-up echocardiogram performed the next day revealed preserved left ventricular systolic function. The patient recovered uneventfully and was recently seen in follow-up by Dr. Harl Bowie. Brilinta was stopped and she was started on Plavix as an alternative because of cost. She was referred for elective surgical consultation.  She was originally seen in consultation on October 21, 2017. She reports that since then she has remained clinically stable. At her last office visit with Bernerd Pho [place] complained of palpitations. She underwent a 48-hour Holter monitor which revealed only sinus rhythm with sinus tachycardia and no sustained irregular heart rhythms. She returns for office today and reports that she is doing reasonably well. She is still having palpitations. She has tried to stop smoking. She states that she only occasionally smokes a cigarette. She has not had any substernal chest pain or chest tightness similar to that which prompted her presentation in February.  The patient has been seen in consultation and counseled at length regarding the indications, risks and potential benefits of surgery.  All questions have been answered, and the patient provides full informed consent for the operation as described.    DETAILS OF THE OPERATIVE PROCEDURE  Preparation:  The patient is brought to the operating room on the above mentioned date and central monitoring was established by the  anesthesia team including placement of Swan-Ganz catheter and radial arterial line. The patient is placed in the supine position on the operating table.  Intravenous antibiotics are administered. General endotracheal anesthesia is induced uneventfully. A Foley catheter is placed.  Baseline transesophageal echocardiogram was performed.  Findings were notable for normal LV function  The patient's chest, abdomen, both groins, and both lower extremities are prepared and draped in a sterile manner. A time out procedure is performed.   Surgical Approach and Conduit Harvest:  A median sternotomy incision was performed and the left internal mammary artery is dissected from the chest wall and prepared for bypass grafting. The left internal mammary artery is notably good quality conduit. Simultaneously, the greater saphenous vein is obtained from the patient's right thigh using endoscopic vein harvest technique. The saphenous vein is notably small caliber but otherwise good quality conduit. After removal of the saphenous vein, the small surgical incisions in the lower extremity are closed with absorbable suture. Following systemic heparinization, the left internal mammary artery was transected distally noted to have excellent flow.   Extracorporeal Cardiopulmonary Bypass and Myocardial Protection:  The pericardium is opened. The ascending aorta is normal in appearance. The ascending aorta and the right atrium are cannulated for cardiopulmonary bypass.  Adequate heparinization is verified.     The entire pre-bypass portion of the operation was notable for stable hemodynamics.  Cardiopulmonary bypass was begun and the surface of the heart is inspected. Distal target vessels are selected for coronary artery bypass grafting. A cardioplegia cannula is placed in the ascending aorta.  A temperature probe was placed in the interventricular septum.  The patient is allowed to cool passively to [facility] systemic  temperature.  The aortic cross clamp is applied and cold blood cardioplegia is delivered initially in an antegrade fashion through the aortic root.  Iced saline slush is applied for topical hypothermia.  The initial cardioplegic arrest is rapid with early diastolic arrest.  Repeat doses of cardioplegia are administered intermittently throughout the entire cross clamp portion of the operation through the aortic root and through subsequently placed vein grafts in order to maintain completely flat electrocardiogram and septal myocardial temperature below 15C.  Myocardial protection was felt to be excellent.  Coronary Artery Bypass Grafting:   The obtuse marginal branch of the left circumflex coronary artery was grafted using a reversed saphenous vein graft in an end-to-side fashion.  At the site of distal anastomosis the target vessel was good quality and measured approximately 1.5 mm in diameter.  The distal left anterior coronary artery was grafted with the left internal mammary artery in an end-to-side fashion.  At the site of distal anastomosis the target vessel was fair quality and measured approximately 1.5 mm in diameter.  After initial completion of the distal anastomosis there was some bleeding and attempts to repair were concerning for possible compromise of the lumen diameter.  An additional dose of cold blood cardioplegia was given and the anastomosis taken down and done over.  All proximal vein graft anastomoses were placed directly to the ascending aorta prior to removal of the aortic cross clamp.  The septal myocardial temperature rose rapidly after reperfusion of the left internal mammary artery graft.  The aortic cross clamp was removed after a total cross clamp time of 70 minutes.   Procedure Completion:  All proximal and distal coronary anastomoses were inspected for hemostasis and appropriate graft orientation. Epicardial pacing wires are fixed to the right ventricular outflow tract  and to the right  atrial appendage. The patient is rewarmed to 37C temperature. The patient is weaned and disconnected from cardiopulmonary bypass.  The patient's rhythm at separation from bypass was AV paced.  The patient was weaned from cardiopulmonary bypass without any inotropic support. Total cardiopulmonary bypass time for the operation was 84 minutes.  Followup transesophageal echocardiogram performed after separation from bypass revealed no changes from the preoperative exam.  The aortic and venous cannula were removed uneventfully. Protamine was administered to reverse the anticoagulation. The mediastinum and pleural space were inspected for hemostasis and irrigated with saline solution. The mediastinum and the left pleural space were drained using 3 chest tubes placed through separate stab incisions inferiorly.  The soft tissues anterior to the aorta were reapproximated loosely. The sternum is closed with double strength sternal wire. The soft tissues anterior to the sternum were closed in multiple layers and the skin is closed with a running subcuticular skin closure.  The post-bypass portion of the operation was notable for stable rhythm and hemodynamics.  No blood products were administered during the operation.   Disposition:  The patient tolerated the procedure well and is transported to the surgical intensive care in stable condition. There are no intraoperative complications. All sponge instrument and needle counts are verified correct at completion of the operation.    Valentina Gu. Roxy Manns MD 12/12/2017 11:38 AM

## 2017-12-12 NOTE — Interval H&P Note (Signed)
History and Physical Interval Note:  12/12/2017 5:58 AM  Melissa Rubio  has presented today for surgery, with the diagnosis of CAD  The various methods of treatment have been discussed with the patient and family. After consideration of risks, benefits and other options for treatment, the patient has consented to  Procedure(s): CORONARY ARTERY BYPASS GRAFTING (CABG) (N/A) TRANSESOPHAGEAL ECHOCARDIOGRAM (TEE) (N/A) as a surgical intervention .  The patient's history has been reviewed, patient examined, no change in status, stable for surgery.  I have reviewed the patient's chart and labs.  Questions were answered to the patient's satisfaction.     Rexene Alberts

## 2017-12-12 NOTE — Progress Notes (Signed)
This note also relates to the following rows which could not be included: SpO2 - Cannot attach notes to unvalidated device data  Vt increased to 500 and RR to 22 per MD based on ABG results. Vitals are stable. RT will continue to monitor.

## 2017-12-12 NOTE — Progress Notes (Signed)
Patient placed back on full support due to ABG results. Vitals are stable. RT will continue to monitor.

## 2017-12-12 NOTE — Progress Notes (Signed)
Patient stated that  At her doppler appointment they found a blood clot in her right leg.

## 2017-12-12 NOTE — Progress Notes (Signed)
Recruitment maneuver performed per MD with following settings: PC of 30, RR 10, PEEP of 5, and 100% FIO2 with I:E of 1:1.0. Patient tolerated well for full 2 minutes. RR increased to 20 per MD based on ABG results. Vitals are stable. RT will continue to monitor.

## 2017-12-12 NOTE — Anesthesia Postprocedure Evaluation (Signed)
Anesthesia Post Note  Patient: Melissa Rubio  Procedure(s) Performed: MEDIAN STERNOTOMY for CORONARY ARTERY BYPASS GRAFTING (CABG) x 2 (LIMA to LAD, SVG to OM) using RIGHT THIGH GREATER SAPHENOUS VEIN and LEFT INTERNAL MAMMARY ARTERY (N/A Chest) TRANSESOPHAGEAL ECHOCARDIOGRAM (TEE) (N/A )     Patient location during evaluation: SICU Anesthesia Type: General Level of consciousness: sedated Pain management: pain level controlled Vital Signs Assessment: post-procedure vital signs reviewed and stable Respiratory status: patient remains intubated per anesthesia plan Cardiovascular status: stable Postop Assessment: no apparent nausea or vomiting Anesthetic complications: no    Last Vitals:  Vitals:   12/12/17 0544 12/12/17 1202  BP: 121/75 127/70  Pulse: (!) 59 80  Resp: 18 12  Temp: 36.7 C   SpO2: 97% 96%    Last Pain:  Vitals:   12/12/17 0552  PainSc: 0-No pain                 Mariela Rex,W. EDMOND

## 2017-12-12 NOTE — Anesthesia Procedure Notes (Signed)
Procedure Name: Intubation Date/Time: 12/12/2017 7:32 AM Performed by: Colin Benton, CRNA Pre-anesthesia Checklist: Emergency Drugs available, Patient identified, Suction available and Patient being monitored Patient Re-evaluated:Patient Re-evaluated prior to induction Oxygen Delivery Method: Circle system utilized Preoxygenation: Pre-oxygenation with 100% oxygen Induction Type: IV induction Ventilation: Mask ventilation without difficulty and Oral airway inserted - appropriate to patient size Laryngoscope Size: Sabra Heck and 2 Grade View: Grade I Tube type: Oral Tube size: 7.5 mm Number of attempts: 1 Airway Equipment and Method: Stylet Placement Confirmation: ETT inserted through vocal cords under direct vision,  positive ETCO2 and breath sounds checked- equal and bilateral Secured at: 22 cm Tube secured with: Tape Dental Injury: Teeth and Oropharynx as per pre-operative assessment

## 2017-12-12 NOTE — Transfer of Care (Signed)
Immediate Anesthesia Transfer of Care Note  Patient: Melissa Rubio  Procedure(s) Performed: MEDIAN STERNOTOMY for CORONARY ARTERY BYPASS GRAFTING (CABG) x 2 (LIMA to LAD, SVG to OM) using RIGHT THIGH GREATER SAPHENOUS VEIN and LEFT INTERNAL MAMMARY ARTERY (N/A Chest) TRANSESOPHAGEAL ECHOCARDIOGRAM (TEE) (N/A )  Patient Location: SICU  Anesthesia Type:General  Level of Consciousness: sedated, unresponsive and Patient remains intubated per anesthesia plan  Airway & Oxygen Therapy: Patient remains intubated per anesthesia plan and Patient placed on Ventilator (see vital sign flow sheet for setting)  Post-op Assessment: Report given to RN and Post -op Vital signs reviewed and stable  Post vital signs: Reviewed and stable  Last Vitals:  Vitals Value Taken Time  BP    Temp    Pulse    Resp    SpO2      Last Pain:  Vitals:   12/12/17 0552  PainSc: 0-No pain      Patients Stated Pain Goal: 3 (18/59/09 3112)  Complications: No apparent anesthesia complications

## 2017-12-12 NOTE — Anesthesia Procedure Notes (Signed)
Central Venous Catheter Insertion Performed by: Roberts Gaudy, MD, anesthesiologist Start/End6/12/2017 6:30 AM, 12/12/2017 6:40 AM Patient location: Pre-op. Preanesthetic checklist: patient identified, IV checked, site marked, risks and benefits discussed, surgical consent, monitors and equipment checked, pre-op evaluation, timeout performed and anesthesia consent Lidocaine 1% used for infiltration and patient sedated Hand hygiene performed  and maximum sterile barriers used  Catheter size: 8 Fr Total catheter length 16. Central line was placed.Double lumen Procedure performed using ultrasound guided technique. Ultrasound Notes:image(s) printed for medical record Attempts: 1 Following insertion, dressing applied and line sutured. Post procedure assessment: blood return through all ports  Patient tolerated the procedure well with no immediate complications.

## 2017-12-12 NOTE — Progress Notes (Signed)
Patient ID: Melissa Rubio, female   DOB: [birth year], 47 y.o.   MRN: [id number] EVENING ROUNDS NOTE :     [address]       [place] [address]             [phone]                 Day of Surgery Procedure(s) (LRB): MEDIAN STERNOTOMY for CORONARY ARTERY BYPASS GRAFTING (CABG) x 2 (LIMA to LAD, SVG to OM) using RIGHT THIGH GREATER SAPHENOUS VEIN and LEFT INTERNAL MAMMARY ARTERY (N/A) TRANSESOPHAGEAL ECHOCARDIOGRAM (TEE) (N/A)  Total Length of Stay:  LOS: 0 days  BP 114/62   Pulse 81   Temp (!) 100.9 F (38.3 C)   Resp 20   Ht 5' (1.524 m)   Wt 171 lb (77.6 kg)   SpO2 100%   BMI 33.40 kg/m   .Intake/Output      06/06 0701 - 06/07 0700   I.V. (mL/kg) 3182.4 (41)   Blood 325   IV Piggyback 1850   Total Intake(mL/kg) 5357.4 (69)   Urine (mL/kg/hr) 1225 (1.2)   Blood 650   Chest Tube 318   Total Output 2193   Net +3164.4         . sodium chloride 20 mL/hr (12/12/17 1454)  . sodium chloride 100 mL/hr at 12/12/17 1215  . [START ON 12/13/2017] sodium chloride    . sodium chloride 20 mL/hr at 12/12/17 2013  . albumin human 250 mL (12/12/17 2016)  . cefUROXime (ZINACEF)  IV Stopped (12/12/17 1712)  . dexmedetomidine (PRECEDEX) IV infusion 0.2 mcg/kg/hr (12/12/17 2028)  . famotidine (PEPCID) IV Stopped (12/12/17 1245)  . insulin (NOVOLIN-R) infusion Stopped (12/12/17 1903)  . lactated ringers    . lactated ringers 10 mL/hr (12/12/17 1215)  . lactated ringers 20 mL/hr (12/12/17 1215)  . nitroGLYCERIN Stopped (12/12/17 1249)  . phenylephrine (NEO-SYNEPHRINE) Adult infusion 40 mcg/min (12/12/17 1946)  . vancomycin 1,000 mg (12/12/17 1946)     Lab Results  Component Value Date   WBC 16.4 (H) 12/12/2017   HGB 9.8 (L) 12/12/2017   HCT 31.4 (L) 12/12/2017   PLT 182 12/12/2017   GLUCOSE 125 (H) 12/12/2017   CHOL 162 12/05/2017   TRIG 191 (H) 12/05/2017   HDL 28 (L) 12/05/2017   LDLCALC 96 12/05/2017   ALT 16 12/10/2017   AST 15 12/10/2017   NA 146 (H)  12/12/2017   K 3.7 12/12/2017   CL 108 12/12/2017   CREATININE 0.60 12/12/2017   BUN 16 12/12/2017   CO2 21 (L) 12/10/2017   TSH 0.694 11/15/2014   INR 1.30 12/12/2017   HGBA1C 7.1 (H) 12/05/2017   Stable post op Failed weaning vent with c02 retention Try again when more alert    Grace Isaac MD  [place] Office [phone] 12/12/2017 8:34 PM

## 2017-12-12 NOTE — Anesthesia Procedure Notes (Signed)
Arterial Line Insertion Start/End6/12/2017 6:40 AM, 12/12/2017 6:45 AM Performed by: Colin Benton, CRNA, CRNA  Patient location: Pre-op. Preanesthetic checklist: patient identified, IV checked, site marked, risks and benefits discussed, surgical consent, monitors and equipment checked, pre-op evaluation, timeout performed and anesthesia consent Lidocaine 1% used for infiltration and patient sedated Left, radial was placed Catheter size: 20 G Hand hygiene performed , maximum sterile barriers used  and Seldinger technique used Allen's test indicative of satisfactory collateral circulation Attempts: 1 Procedure performed without using ultrasound guided technique. Following insertion, dressing applied and Biopatch. Post procedure assessment: normal and unchanged  Patient tolerated the procedure well with no immediate complications.

## 2017-12-13 ENCOUNTER — Inpatient Hospital Stay (HOSPITAL_COMMUNITY): Payer: PRIVATE HEALTH INSURANCE

## 2017-12-13 LAB — POCT I-STAT 3, ART BLOOD GAS (G3+)
Acid-base deficit: 1 mmol/L (ref 0.0–2.0)
Acid-base deficit: 4 mmol/L — ABNORMAL HIGH (ref 0.0–2.0)
Acid-base deficit: 5 mmol/L — ABNORMAL HIGH (ref 0.0–2.0)
Acid-base deficit: 6 mmol/L — ABNORMAL HIGH (ref 0.0–2.0)
BICARBONATE: 25.1 mmol/L (ref 20.0–28.0)
BICARBONATE: 22.2 mmol/L (ref 20.0–28.0)
BICARBONATE: 19.4 mmol/L — AB (ref 20.0–28.0)
Bicarbonate: 21.2 mmol/L (ref 20.0–28.0)
O2 SAT: 97 %
O2 Saturation: 97 %
O2 Saturation: 97 %
O2 Saturation: 94 %
PCO2 ART: 46.9 mmHg (ref 32.0–48.0)
PH ART: 7.297 — AB (ref 7.350–7.450)
PO2 ART: 108 mmHg (ref 83.0–108.0)
PO2 ART: 114 mmHg — AB (ref 83.0–108.0)
Patient temperature: 38.8
Patient temperature: 38.6
TCO2: 27 mmol/L (ref 22–32)
TCO2: 23 mmol/L (ref 22–32)
TCO2: 22 mmol/L (ref 22–32)
TCO2: 21 mmol/L — AB (ref 22–32)
pCO2 arterial: 50.8 mmHg — ABNORMAL HIGH (ref 32.0–48.0)
pCO2 arterial: 44 mmHg (ref 32.0–48.0)
pCO2 arterial: 39.4 mmHg (ref 32.0–48.0)
pH, Arterial: 7.311 — ABNORMAL LOW (ref 7.350–7.450)
pH, Arterial: 7.291 — ABNORMAL LOW (ref 7.350–7.450)
pH, Arterial: 7.306 — ABNORMAL LOW (ref 7.350–7.450)
pO2, Arterial: 102 mmHg (ref 83.0–108.0)
pO2, Arterial: 80 mmHg — ABNORMAL LOW (ref 83.0–108.0)

## 2017-12-13 LAB — CBC
HCT: 30.7 % — ABNORMAL LOW (ref 36.0–46.0)
HEMATOCRIT: 32.4 % — AB (ref 36.0–46.0)
HEMOGLOBIN: 10.3 g/dL — AB (ref 12.0–15.0)
Hemoglobin: 9.6 g/dL — ABNORMAL LOW (ref 12.0–15.0)
MCH: 29.4 pg (ref 26.0–34.0)
MCH: 30 pg (ref 26.0–34.0)
MCHC: 31.3 g/dL (ref 30.0–36.0)
MCHC: 31.8 g/dL (ref 30.0–36.0)
MCV: 94.2 fL (ref 78.0–100.0)
MCV: 94.5 fL (ref 78.0–100.0)
PLATELETS: 152 10*3/uL (ref 150–400)
Platelets: 202 10*3/uL (ref 150–400)
RBC: 3.26 MIL/uL — AB (ref 3.87–5.11)
RBC: 3.43 MIL/uL — ABNORMAL LOW (ref 3.87–5.11)
RDW: 15.1 % (ref 11.5–15.5)
RDW: 15.2 % (ref 11.5–15.5)
WBC: 13.8 10*3/uL — AB (ref 4.0–10.5)
WBC: 21.7 10*3/uL — ABNORMAL HIGH (ref 4.0–10.5)

## 2017-12-13 LAB — BASIC METABOLIC PANEL
ANION GAP: 4 — AB (ref 5–15)
BUN: 14 mg/dL (ref 6–20)
CO2: 23 mmol/L (ref 22–32)
Calcium: 7.8 mg/dL — ABNORMAL LOW (ref 8.9–10.3)
Chloride: 116 mmol/L — ABNORMAL HIGH (ref 101–111)
Creatinine, Ser: 0.67 mg/dL (ref 0.44–1.00)
GFR calc Af Amer: >60 mL/min (ref >60)
GFR calc non Af Amer: >60 mL/min (ref >60)
Glucose, Bld: 110 mg/dL — ABNORMAL HIGH (ref 65–99)
POTASSIUM: 4.2 mmol/L (ref 3.5–5.1)
Sodium: 143 mmol/L (ref 135–145)

## 2017-12-13 LAB — GLUCOSE, CAPILLARY
GLUCOSE-CAPILLARY: 107 mg/dL — AB (ref 65–99)
GLUCOSE-CAPILLARY: 108 mg/dL — AB (ref 65–99)
GLUCOSE-CAPILLARY: 108 mg/dL — AB (ref 65–99)
GLUCOSE-CAPILLARY: 125 mg/dL — AB (ref 65–99)
GLUCOSE-CAPILLARY: 130 mg/dL — AB (ref 65–99)
GLUCOSE-CAPILLARY: 142 mg/dL — AB (ref 65–99)
Glucose-Capillary: 100 mg/dL — ABNORMAL HIGH (ref 65–99)
Glucose-Capillary: 103 mg/dL — ABNORMAL HIGH (ref 65–99)
Glucose-Capillary: 142 mg/dL — ABNORMAL HIGH (ref 65–99)
Glucose-Capillary: 173 mg/dL — ABNORMAL HIGH (ref 65–99)
Glucose-Capillary: 185 mg/dL — ABNORMAL HIGH (ref 65–99)

## 2017-12-13 LAB — POTASSIUM: POTASSIUM: 4.3 mmol/L (ref 3.5–5.1)

## 2017-12-13 LAB — CREATININE, SERUM
CREATININE: 0.63 mg/dL (ref 0.44–1.00)
GFR calc Af Amer: >60 mL/min (ref >60)
GFR calc non Af Amer: >60 mL/min (ref >60)

## 2017-12-13 LAB — MAGNESIUM
Magnesium: 2.8 mg/dL — ABNORMAL HIGH (ref 1.7–2.4)
Magnesium: 2.1 mg/dL (ref 1.7–2.4)

## 2017-12-13 MED ORDER — ALPRAZOLAM 0.5 MG PO TABS
0.5000 mg | ORAL_TABLET | Freq: Three times a day (TID) | ORAL | Status: DC | PRN
  Administered 2017-12-13 – 2017-12-15 (×5): 0.5 mg via ORAL
  Filled 2017-12-13 (×5): qty 1

## 2017-12-13 MED ORDER — ASPIRIN 81 MG PO CHEW
81.0000 mg | CHEWABLE_TABLET | Freq: Every day | ORAL | Status: DC
  Administered 2017-12-14 – 2017-12-17 (×4): 81 mg via ORAL
  Filled 2017-12-13 (×4): qty 1

## 2017-12-13 MED ORDER — CARVEDILOL 3.125 MG PO TABS: 3.1250 mg | ORAL_TABLET | Freq: Two times a day (BID) | ORAL | Status: DC

## 2017-12-13 MED ORDER — GABAPENTIN 400 MG PO CAPS
800.0000 mg | ORAL_CAPSULE | Freq: Four times a day (QID) | ORAL | Status: DC
  Administered 2017-12-13 – 2017-12-17 (×18): 800 mg via ORAL
  Filled 2017-12-13 (×2): qty 2
  Filled 2017-12-13: qty 8
  Filled 2017-12-13 (×2): qty 2
  Filled 2017-12-13: qty 8
  Filled 2017-12-13 (×11): qty 2

## 2017-12-13 MED ORDER — ENOXAPARIN SODIUM 40 MG/0.4ML ~~LOC~~ SOLN
40.0000 mg | Freq: Every day | SUBCUTANEOUS | Status: DC
  Administered 2017-12-14: 40 mg via SUBCUTANEOUS
  Filled 2017-12-13: qty 0.4

## 2017-12-13 MED ORDER — ATORVASTATIN CALCIUM 80 MG PO TABS
80.0000 mg | ORAL_TABLET | Freq: Every day | ORAL | Status: DC
  Administered 2017-12-14 – 2017-12-16 (×3): 80 mg via ORAL
  Filled 2017-12-13 (×3): qty 1

## 2017-12-13 MED ORDER — GABAPENTIN 800 MG PO TABS
800.0000 mg | ORAL_TABLET | Freq: Four times a day (QID) | ORAL | Status: DC
  Filled 2017-12-13 (×2): qty 1

## 2017-12-13 MED ORDER — ASPIRIN EC 325 MG PO TBEC
325.0000 mg | DELAYED_RELEASE_TABLET | Freq: Every day | ORAL | Status: AC
  Administered 2017-12-13: 325 mg via ORAL
  Filled 2017-12-13: qty 1

## 2017-12-13 MED ORDER — CARVEDILOL 3.125 MG PO TABS
3.1250 mg | ORAL_TABLET | Freq: Two times a day (BID) | ORAL | Status: DC
  Administered 2017-12-14 – 2017-12-17 (×7): 3.125 mg via ORAL
  Filled 2017-12-13 (×7): qty 1

## 2017-12-13 MED ORDER — KETOROLAC TROMETHAMINE 15 MG/ML IJ SOLN
15.0000 mg | Freq: Four times a day (QID) | INTRAMUSCULAR | Status: AC | PRN
  Administered 2017-12-13 (×2): 15 mg via INTRAVENOUS
  Filled 2017-12-13 (×2): qty 1

## 2017-12-13 MED ORDER — NICOTINE 14 MG/24HR TD PT24
14.0000 mg | MEDICATED_PATCH | Freq: Every day | TRANSDERMAL | Status: DC
  Administered 2017-12-13 – 2017-12-17 (×5): 14 mg via TRANSDERMAL
  Filled 2017-12-13 (×5): qty 1

## 2017-12-13 MED ORDER — DEXMEDETOMIDINE HCL IN NACL 400 MCG/100ML IV SOLN
0.0000 ug/kg/h | INTRAVENOUS | Status: DC
Start: 2017-12-13 — End: 2017-12-13
  Administered 2017-12-13: 0.5 ug/kg/h via INTRAVENOUS
  Filled 2017-12-13: qty 100

## 2017-12-13 MED ORDER — CLOPIDOGREL BISULFATE 75 MG PO TABS
75.0000 mg | ORAL_TABLET | Freq: Every day | ORAL | Status: DC
  Administered 2017-12-14 – 2017-12-17 (×4): 75 mg via ORAL
  Filled 2017-12-13 (×4): qty 1

## 2017-12-13 MED ORDER — EZETIMIBE 10 MG PO TABS
10.0000 mg | ORAL_TABLET | Freq: Every day | ORAL | Status: DC
  Administered 2017-12-14 – 2017-12-17 (×4): 10 mg via ORAL
  Filled 2017-12-13 (×4): qty 1

## 2017-12-13 MED ORDER — CITALOPRAM HYDROBROMIDE 20 MG PO TABS
40.0000 mg | ORAL_TABLET | Freq: Every day | ORAL | Status: DC
  Administered 2017-12-14 – 2017-12-17 (×4): 40 mg via ORAL
  Filled 2017-12-13 (×4): qty 2

## 2017-12-13 MED ORDER — FENTANYL CITRATE (PF) 100 MCG/2ML IJ SOLN
50.0000 ug | INTRAMUSCULAR | Status: DC | PRN
  Administered 2017-12-13 – 2017-12-14 (×4): 50 ug via INTRAVENOUS
  Filled 2017-12-13 (×4): qty 2

## 2017-12-13 MED ORDER — INSULIN ASPART 100 UNIT/ML ~~LOC~~ SOLN
0.0000 [IU] | SUBCUTANEOUS | Status: DC
  Administered 2017-12-13: 4 [IU] via SUBCUTANEOUS
  Administered 2017-12-13 – 2017-12-15 (×7): 2 [IU] via SUBCUTANEOUS

## 2017-12-13 MED FILL — Heparin Sodium (Porcine) Inj 1000 Unit/ML: INTRAMUSCULAR | Qty: 30 | Status: AC

## 2017-12-13 MED FILL — Magnesium Sulfate Inj 50%: INTRAMUSCULAR | Qty: 10 | Status: AC

## 2017-12-13 MED FILL — Potassium Chloride Inj 2 mEq/ML: INTRAVENOUS | Qty: 40 | Status: AC

## 2017-12-13 NOTE — Discharge Instructions (Signed)

## 2017-12-13 NOTE — Discharge Summary (Signed)
Physician Discharge Summary       [address]       [place] [address]             [phone]    Patient ID: Melissa Rubio MRN: [id number] DOB/AGE: Jul 19, 1970 47 y.o.  Admit date: 12/12/2017 Discharge date: 12/17/2017  Admission Diagnoses:  Coronary artery disease involving native coronary artery of native heart with angina pectoris [facility])  Discharge Diagnoses:  1. S/P CABG x 2 2. ABL anemia 3. History of Myocardial infarction and PCI/stent to dRCA(HCC) 4. History of Diabetes ([place]) 5. History of  Hypertension 6. History of Anxiety 7. History of BMI 29.0-29.9,adult  8. History of cancer (cervical)  9. History of RLS (restless legs syndrome) 10. History of tobacco abuse   Procedure (s):   Coronary Artery Bypass Grafting x 2              Left Internal Mammary Artery to Distal Left Anterior Descending Coronary Artery             Saphenous Vein Graft to Obtuse Marginal Branch of Left Circumflex Coronary Artery  Endoscopic Vein Harvest from Right Thigh by Dr. Roxy Manns on 12/12/2017.    History of Presenting Illness: Patient is a 47 year old moderately obese female with history of hypertension, type 2 diabetes mellitus, hyperlipidemia, long-standing tobacco abuse, and a strong family history of coronary artery disease who recently presented with an acute inferior wall ST segment elevation myocardial infarction, was treated with PCI and stenting of the right coronary artery, and has now been referred for elective surgical consultation to discuss treatment options for management of left main and severe three-vessel coronary artery disease.  The patient states that she was in her usual state of health until early February of this year when she began to experience episodes of substernal chest discomfort described as a burning pain which she initially attributed to indigestion. Symptoms were frequent and not necessarily related to physical activity or meals. On the morning  of September 01, 2017 the patient developed similar symptoms of burning substernal chest pain associated with shortness of breath that persisted. She presented to the emergency department where she was diagnosed with acute evolving inferior wall ST segment elevation myocardial infarction.She was taken directly to the Cath Lab by Dr. Martinique where she was found to have 100% occlusion of the mid right coronary artery. She was treated with PCI and stenting using a single drug-eluting stent in the mid right coronary artery. She was also found to have 50% left main coronary artery stenosis with ostial stenosis of the left anterior descending coronary artery and the left circumflex coronary artery. Follow-up echocardiogram performed the next day revealed preserved left ventricular systolic function. The patient recovered uneventfully and was recently seen in follow-up by Dr. Harl Bowie. Brilinta was stopped and she was started on Plavix as an alternative because of cost. She was referred for elective surgical consultation.  She was originally seen in consultation on October 21, 2017. She reports that since then she has remained clinically stable. At her last office visit with Bernerd [place] complained of palpitations. She underwent a 48-hour Holter monitor which revealed only sinus rhythm with sinus tachycardia and no sustained irregular heart rhythms. She returns for office today and reports that she is doing reasonably well. She is still having palpitations. She has tried to stop smoking. She states that she only occasionally smokes a cigarette. She has not had any substernal chest pain or chest tightness similar  to that which prompted her presentation in February.  The patient is single but currently lives with her fianc in [place]. She is not currently working. She has 2 adult children. She has a long-standing history of tobacco abuse. She states that since  hospital discharge from her recent heart attack she has smoked only 1 cigarette. She lives a fairly sedentary lifestyle. Prior to her heart attack she states that she was limited by lower back pain and bilateral hip pain related to degenerative arthritis and lumbar disc disease. Since hospital discharge she has not had any symptoms of substernal chest pain or chest tightness similar to that which occurred prior to her recent heart attack. She states that she has poor energy and she gets short of breath with activity. She denies any resting shortness of breath. She has had some palpitations. She reports occasional mild dizzy spells. She denies lower extremity edema. Dr. Roxy Manns again reviewed the indications, risk, and potential benefits of coronary artery bypass grafting with the patient in the office today. Expectations for her postoperative convalescence have been discussed.The patient understands and accepts all potential associated risks of surgery. Pre operative carotid duplex US showed no significant internal carotid artery stenosis bilaterally. Patient underwent a CABG x 3 on 12/12/2017.  Brief Hospital Course:  The patient was extubated the morning of post operative day one without difficulty.  She remained afebrile and hemodynamically stable. She was weaned off Neo Synephrine drip. Gordy Councilman, a line, and foley were removed early in the post operative course. Chest tubes remained for a few days and then were removed on 06/08. Lopressor was started and titrated accordingly. She was volume over loaded and diuresed. She had ABL anemia. He did not require a post op transfusion. Last H and H was 9.6 and 30.  She was started on Trinsicon. She will be discharged on oral iron to be taken daily for one month. She was weaned off the insulin drip.  Once she was tolerating a diet, Metformin was restarted at a lower dose and Insulin was discontinued.  The patient's glucose remained well controlled.The  patient's HGA1C pre op was 7.1  . The patient was felt surgically stable for transfer from the ICU to PCTU for further convalescence on 12/15/2017. She continues to progress with cardiac rehab. She required several liters of oxygen post op. She was later weaned to room air. She has been tolerating a diet and has had a bowel movement. Epicardial pacing wires were removed on 12/16/2017. Chest tube sutures will be removed the day of discharge. The patient is felt surgically stable for discharge today.   Latest Vital Signs: Blood pressure (!) 126/94, pulse 84, temperature 98.6 F (37 C), temperature source Oral, resp. rate 18, height 5' (1.524 m), weight 170 lb 9.6 oz (77.4 kg), SpO2 100 %.  Physical Exam: Cardiovascular: RRR Pulmonary: Mostly clear bilaterally Abdomen: Soft, non tender, bowel sounds present. Extremities: Trace bilateral lower extremity edema. Ecchymosis right thigh. Wounds: Clean and dry.  No erythema or signs of infection.   Discharge Condition: Stable and discharge to home.  Recent laboratory studies:  Lab Results  Component Value Date   WBC 9.2 12/16/2017   HGB 9.6 (L) 12/16/2017   HCT 30.0 (L) 12/16/2017   MCV 91.7 12/16/2017   PLT 237 12/16/2017   Lab Results  Component Value Date   NA 139 12/16/2017   K 3.5 12/16/2017   CL 105 12/16/2017   CO2 24 12/16/2017   CREATININE 0.85  12/16/2017   GLUCOSE 107 (H) 12/16/2017     Diagnostic Studies: Dg Chest 2 View  Result Date: 12/16/2017 CLINICAL DATA:  Status post CABG.  Right pleural effusion. EXAM: CHEST - 2 VIEW COMPARISON:  12/15/2017 FINDINGS: Central catheters have been removed. Heart size and vascularity are normal. Small bilateral pleural effusions, right greater than left. Slight posterior atelectasis, unchanged. IMPRESSION: No significant change since the prior study. Small bilateral effusions, right greater than left. Electronically Signed   By: Lorriane Shire M.D.   On: 12/16/2017 08:52    Discharge  Instructions    Amb Referral to Cardiac Rehabilitation   Complete by:  As directed    Diagnosis:  CABG   CABG X ___:  2      Discharge Medications: Allergies as of 12/17/2017   No Known Allergies     Medication List    STOP taking these medications   cephALEXin 500 MG capsule Commonly known as:  KEFLEX   isosorbide mononitrate 30 MG 24 hr tablet Commonly known as:  IMDUR   nitroGLYCERIN 0.4 MG SL tablet Commonly known as:  NITROSTAT     TAKE these medications   acetaminophen 325 MG tablet Commonly known as:  TYLENOL Take 650 mg by mouth 2 (two) times daily as needed for moderate pain or headache.   ALPRAZolam 0.5 MG tablet Commonly known as:  XANAX Take 1 tablet (0.5 mg total) by mouth 3 (three) times daily as needed for anxiety.   aspirin 81 MG chewable tablet Chew 1 tablet (81 mg total) by mouth daily.   atorvastatin 80 MG tablet Commonly known as:  LIPITOR TAKE 1 TABLET BY MOUTH ONCE DAILY AT  6PM   busPIRone 10 MG tablet Commonly known as:  BUSPAR Take 1 tablet (10 mg total) by mouth 3 (three) times daily.   carvedilol 3.125 MG tablet Commonly known as:  COREG Take 1 tablet (3.125 mg total) by mouth 2 (two) times daily with a meal.   citalopram 40 MG tablet Commonly known as:  CELEXA TAKE 1 TABLET BY MOUTH ONCE DAILY   clopidogrel 75 MG tablet Commonly known as:  PLAVIX Take 1 tablet (75 mg total) by mouth daily.   cyclobenzaprine 10 MG tablet Commonly known as:  FLEXERIL Take 1 tablet (10 mg total) by mouth 3 (three) times daily as needed for muscle spasms.   ezetimibe 10 MG tablet Commonly known as:  ZETIA Take 1 tablet (10 mg total) by mouth daily.   ferrous sulfate 325 (65 FE) MG tablet Take 1 tablet (325 mg total) by mouth daily with breakfast. For one month then stop.   furosemide 40 MG tablet Commonly known as:  LASIX Take 1 tablet (40 mg total) by mouth daily. For 5 days then stop.   gabapentin 800 MG tablet Commonly known as:   NEURONTIN Take 1 tablet (800 mg total) by mouth 4 (four) times daily.   hydrOXYzine 50 MG tablet Commonly known as:  ATARAX/VISTARIL Take 1 tablet (50 mg total) by mouth every 8 (eight) hours as needed. What changed:  reasons to take this   ICY HOT EX Apply 1 application topically daily as needed (back pain).   insulin NPH Human 100 UNIT/ML injection Commonly known as:  HUMULIN N,NOVOLIN N Inject 0.4 mLs (40 Units total) 2 (two) times daily before a meal into the skin. What changed:    how much to take  when to take this  additional instructions   INSULIN SYRINGE .5CC/29G 29G X  1/2" 0.5 ML Misc 1 Syringe by Does not apply route 2 (two) times daily.   metFORMIN 1000 MG tablet Commonly known as:  GLUCOPHAGE Take 1 tablet (1,000 mg total) by mouth 2 (two) times daily with a meal.   nicotine 21 mg/24hr patch Commonly known as:  NICODERM CQ - dosed in mg/24 hours Place 1 patch (21 mg total) onto the skin daily.   omeprazole 20 MG capsule Commonly known as:  PRILOSEC Take 20 mg by mouth daily.   oxyCODONE 5 MG immediate release tablet Commonly known as:  Oxy IR/ROXICODONE Take 1 tablet (5 mg total) by mouth every 4 (four) hours as needed for severe pain.   potassium chloride SA 20 MEQ tablet Commonly known as:  K-DUR,KLOR-CON Take 1 tablet (20 mEq total) by mouth daily. For 5 days then stop.      The patient has been discharged on:   1.Beta Blocker:  Yes [ x  ]                              No   [   ]                              If No, reason:  2.Ace Inhibitor/ARB: Yes [   ]                                     No  [    ]                                     If No, reason:  3.Statin:   Yes [ x  ]                  No  [   ]                  If No, reason:  4.Ecasa:  Yes  [  x ]                  No   [   ]                  If No, reason:   Follow Up Appointments: Follow-up Information    Branch, Alphonse Guild, MD Follow up.   Specialty:  Cardiology Why:   12/27/17 at 3pm as previously scheduled (arrive 15 minutes prior to check in) Contact information: [place] [address] [phone]        Rexene Alberts, MD. Go on 01/13/2018.   Specialty:  Cardiothoracic Surgery Why:  PA/LAT CXR (to be taken at [facility] which is in the same building as Dr. Guy Sandifer office) on 01/13/2018 at 2:00 pmAppointment time is at 2:30 pm Contact information: [place] [address] [phone]           Signed: Sharalyn Ink [facility] 12/17/2017, 12:49 PM

## 2017-12-13 NOTE — Progress Notes (Signed)
Rapid weaning protocol started per MD.

## 2017-12-13 NOTE — Procedures (Signed)
Extubation Procedure Note  Patient Details:   Name: Melissa Rubio DOB: January 17, 1971 MRN: [id number]   Airway Documentation:    Vent end date: 12/13/17 Vent end time: 0835   Evaluation  O2 sats: stable throughout Complications: No apparent complications Patient did tolerate procedure well. Bilateral Breath Sounds: Clear, Diminished   Yes   No noted respiratory issues at this time. Spo2 99 HR 80 RR 18 NIF was -27 VC .8 L. Patient on 4lpm [place] current ly.  Rudene Re 12/13/2017, 8:41 AM

## 2017-12-13 NOTE — Progress Notes (Addendum)
TCTS DAILY ICU PROGRESS NOTE                   [address]            [place] [address]          [phone]   1 Day Post-Op Procedure(s) (LRB): MEDIAN STERNOTOMY for CORONARY ARTERY BYPASS GRAFTING (CABG) x 2 (LIMA to LAD, SVG to OM) using RIGHT THIGH GREATER SAPHENOUS VEIN and LEFT INTERNAL MAMMARY ARTERY (N/A) TRANSESOPHAGEAL ECHOCARDIOGRAM (TEE) (N/A)  Total Length of Stay:  LOS: 1 day   Subjective: Patient awake and alert this am. She is still on vent but weaning to extubate.  Objective: Vital signs in last 24 hours: Temp:  [96.6 F (35.9 C)-101.7 F (38.7 C)] 100.8 F (38.2 C) (06/07 0742) Pulse Rate:  [76-83] 79 (06/07 0742) Cardiac Rhythm: Normal sinus rhythm (06/07 0600) Resp:  [12-26] 17 (06/07 0742) BP: (79-127)/(52-87) 101/75 (06/07 0742) SpO2:  [96 %-100 %] 100 % (06/07 0742) Arterial Line BP: (82-310)/(47-305) 113/63 (06/07 0600) FiO2 (%):  [40 %-50 %] 40 % (06/07 0742)  Filed Weights   12/12/17 0552  Weight: 171 lb (77.6 kg)    Weight change:    Hemodynamic parameters for last 24 hours: PAP: (29-43)/(15-27) 30/19 CO:  [3.1 L/min-4.4 L/min] 4.4 L/min CI:  [1.8 L/min/m2-2.5 L/min/m2] 2.5 L/min/m2  Intake/Output from previous day: 06/06 0701 - 06/07 0700 In: 7379.5 [I.V.:4604.5; Blood:325; IV [id number]:2683] Out: 4196 [QIWLN:9892; Blood:650; Chest Tube:648]  Intake/Output this shift: No intake/output data recorded.  Current Meds: Scheduled Meds: . acetaminophen  1,000 mg Oral Q6H   Or  . acetaminophen (TYLENOL) oral liquid 160 mg/5 mL  1,000 mg Per Tube Q6H  . aspirin EC  325 mg Oral Daily   Or  . aspirin  324 mg Per Tube Daily  . bisacodyl  10 mg Oral Daily   Or  . bisacodyl  10 mg Rectal Daily  . docusate sodium  200 mg Oral Daily  . insulin regular  0-10 Units Intravenous TID WC  . metoprolol tartrate  12.5 mg Oral BID   Or  . metoprolol tartrate  12.5 mg Per Tube BID  . [START ON 12/14/2017] pantoprazole  40 mg Oral  Daily  . sodium chloride flush  3 mL Intravenous Q12H   Continuous Infusions: . sodium chloride 20 mL/hr at 12/13/17 0700  . sodium chloride    . sodium chloride 20 mL/hr at 12/12/17 2013  . albumin human 250 mL (12/12/17 2016)  . cefUROXime (ZINACEF)  IV Stopped (12/13/17 0448)  . dexmedetomidine 0.3 mcg/kg/hr (12/13/17 0521)  . insulin (NOVOLIN-R) infusion Stopped (12/12/17 1903)  . lactated ringers    . lactated ringers 10 mL/hr (12/12/17 1215)  . lactated ringers 20 mL/hr at 12/13/17 0700  . nitroGLYCERIN Stopped (12/12/17 1249)  . phenylephrine (NEO-SYNEPHRINE) Adult infusion 20 mcg/min (12/13/17 0502)   PRN Meds:.sodium chloride, albumin human, lactated ringers, metoprolol tartrate, midazolam, morphine injection, ondansetron (ZOFRAN) IV, oxyCODONE, sodium chloride flush, traMADol  General appearance: cooperative and no distress Neurologic: intact Heart: RRR, rub with chest tubes in place Lungs: Slightly diminished at bases Abdomen: Soft, non tender, bowel sounds sporadic Extremities: Mild LE edema Wound: Aquacel intact;RLE dressing is clean and dry  Lab Results: CBC: Recent Labs    12/12/17 1740 12/12/17 1801 12/13/17 0415  WBC 16.4*  --  13.8*  HGB 9.8* 9.9* 9.6*  HCT 31.4* 29.0* 30.7*  PLT 182  --  152  BMET:  Recent Labs    12/10/17 1149  12/12/17 1801 12/13/17 0415  NA 140   < > 145 143  K 4.2   < > 4.7 4.2  CL 109   < > 111 116*  CO2 21*  --   --  23  GLUCOSE 128*   < > 103* 110*  BUN 16   < > 16 14  CREATININE 0.70   < > 0.60 0.67  CALCIUM 9.4  --   --  7.8*   < > = values in this interval not displayed.    CMET: Lab Results  Component Value Date   WBC 13.8 (H) 12/13/2017   HGB 9.6 (L) 12/13/2017   HCT 30.7 (L) 12/13/2017   PLT 152 12/13/2017   GLUCOSE 110 (H) 12/13/2017   CHOL 162 12/05/2017   TRIG 191 (H) 12/05/2017   HDL 28 (L) 12/05/2017   LDLCALC 96 12/05/2017   ALT 16 12/10/2017   AST 15 12/10/2017   NA 143 12/13/2017   K 4.2  12/13/2017   CL 116 (H) 12/13/2017   CREATININE 0.67 12/13/2017   BUN 14 12/13/2017   CO2 23 12/13/2017   TSH 0.694 11/15/2014   INR 1.30 12/12/2017   HGBA1C 7.1 (H) 12/05/2017      PT/INR:  Recent Labs    12/12/17 1213  LABPROT 16.1*  INR 1.30   Radiology: Dg Chest Port 1 View  Result Date: 12/12/2017 CLINICAL DATA:  Status post CABG. EXAM: PORTABLE CHEST 1 VIEW COMPARISON:  12/04/2017. FINDINGS: 1238 hours. Endotracheal tube tip is 3.7 cm above the base the carina. Right IJ pulmonary artery catheter tip is in the main pulmonary artery or proximal right main pulmonary artery. Right IJ central line tip overlies the distal SVC. 2 midline drains compatible with mediastinal or pericardial position. Curvilinear density overlying the right lower lung may represent a third thoracic drain. There is some atelectasis in the left lung. No evidence for pneumothorax. No substantial pleural effusion. Telemetry leads overlie the chest. IMPRESSION: Low lung volumes with left lung atelectasis. No pneumothorax or pleural effusion. Electronically Signed   By: Misty Stanley M.D.   On: 12/12/2017 12:52     Assessment/Plan: S/P Procedure(s) (LRB): MEDIAN STERNOTOMY for CORONARY ARTERY BYPASS GRAFTING (CABG) x 2 (LIMA to LAD, SVG to OM) using RIGHT THIGH GREATER SAPHENOUS VEIN and LEFT INTERNAL MAMMARY ARTERY (N/A) TRANSESOPHAGEAL ECHOCARDIOGRAM (TEE) (N/A)  1. CV-SR in the 70's this am. On Neo Synephrine drip and Lopressor 12.5 mg bid. 2. Pulmonary-Started rapid wean this am. Chest tubes with 648 of output since surgery. Will leave chest tubes for now. CXR this am appears to show low lung volumes, no pneumothorax, small pleural effusions, and atelectasis on left.  3. Volume overload-will diurese once off Neo Synephrine 4. ABL anemia-H and H 9.6 and 30.7 this am. 5. DM-CBGs 103/107/108. Pre op HGA1C 7.1. On Metformin and Insulin prior to surgery. Metformin will be restarted later once tolerating a  diet. 6. Fever to 100.8. WBC 13.800 )SIRS) but only post op day one. Monitor    Donielle Liston Alba PA-C 12/13/2017 7:57 AM   I have seen and examined the patient and agree with the assessment and plan as outlined.  Extubated uneventfully.  Maintaining NSR w/ stable hemodynamics on low dose Neo for BP support.  Mobilize.  Lasix to stimulate diuresis once BP stable off Neo.  D/C lines.  D/C tubes later today or tomorrow, depending on output.  Restart antianxiety meds  and nicotine patch.  Needs smoking cessation counseling.   Rexene Alberts, MD 12/13/2017 9:16 AM

## 2017-12-13 NOTE — Progress Notes (Signed)
Per Dr. Roxy Manns - initiate rapid wean protocol.

## 2017-12-13 NOTE — Progress Notes (Signed)
CT surgery p.m. Rounds  Patient up in chair complaining of pain Morphine changed to PRN fentanyl Continue with preop dose of Neurontin and Xanax

## 2017-12-14 ENCOUNTER — Other Ambulatory Visit: Payer: Self-pay

## 2017-12-14 ENCOUNTER — Encounter (HOSPITAL_COMMUNITY): Payer: Self-pay | Admitting: Thoracic Surgery (Cardiothoracic Vascular Surgery)

## 2017-12-14 ENCOUNTER — Inpatient Hospital Stay (HOSPITAL_COMMUNITY): Payer: PRIVATE HEALTH INSURANCE

## 2017-12-14 LAB — BASIC METABOLIC PANEL
Anion gap: 5 (ref 5–15)
BUN: 17 mg/dL (ref 6–20)
CALCIUM: 8.4 mg/dL — AB (ref 8.9–10.3)
CO2: 23 mmol/L (ref 22–32)
CREATININE: 0.59 mg/dL (ref 0.44–1.00)
Chloride: 111 mmol/L (ref 101–111)
GFR calc non Af Amer: >60 mL/min (ref >60)
Glucose, Bld: 146 mg/dL — ABNORMAL HIGH (ref 65–99)
Potassium: 4.3 mmol/L (ref 3.5–5.1)
SODIUM: 139 mmol/L (ref 135–145)

## 2017-12-14 LAB — POCT I-STAT, CHEM 8
BUN: 17 mg/dL (ref 6–20)
CALCIUM ION: 1.29 mmol/L (ref 1.15–1.40)
CREATININE: 0.6 mg/dL (ref 0.44–1.00)
Chloride: 101 mmol/L (ref 101–111)
GLUCOSE: 168 mg/dL — AB (ref 65–99)
HEMATOCRIT: 24 % — AB (ref 36.0–46.0)
HEMOGLOBIN: 8.2 g/dL — AB (ref 12.0–15.0)
Potassium: 3.8 mmol/L (ref 3.5–5.1)
Sodium: 140 mmol/L (ref 135–145)
TCO2: 26 mmol/L (ref 22–32)

## 2017-12-14 LAB — POCT I-STAT 3, ART BLOOD GAS (G3+)
ACID-BASE DEFICIT: 3 mmol/L — AB (ref 0.0–2.0)
BICARBONATE: 23 mmol/L (ref 20.0–28.0)
O2 Saturation: 90 %
PH ART: 7.315 — AB (ref 7.350–7.450)
TCO2: 24 mmol/L (ref 22–32)
pCO2 arterial: 45.3 mmHg (ref 32.0–48.0)
pO2, Arterial: 64 mmHg — ABNORMAL LOW (ref 83.0–108.0)

## 2017-12-14 LAB — GLUCOSE, CAPILLARY
GLUCOSE-CAPILLARY: 151 mg/dL — AB (ref 65–99)
GLUCOSE-CAPILLARY: 132 mg/dL — AB (ref 65–99)
Glucose-Capillary: 96 mg/dL (ref 65–99)
Glucose-Capillary: 114 mg/dL — ABNORMAL HIGH (ref 65–99)

## 2017-12-14 LAB — CBC
HEMATOCRIT: 28.2 % — AB (ref 36.0–46.0)
HEMOGLOBIN: 8.8 g/dL — AB (ref 12.0–15.0)
MCH: 29.5 pg (ref 26.0–34.0)
MCHC: 31.2 g/dL (ref 30.0–36.0)
MCV: 94.6 fL (ref 78.0–100.0)
Platelets: 152 10*3/uL (ref 150–400)
RBC: 2.98 MIL/uL — ABNORMAL LOW (ref 3.87–5.11)
RDW: 15.1 % (ref 11.5–15.5)
WBC: 15 10*3/uL — ABNORMAL HIGH (ref 4.0–10.5)

## 2017-12-14 MED ORDER — KETOROLAC TROMETHAMINE 15 MG/ML IJ SOLN
15.0000 mg | Freq: Four times a day (QID) | INTRAMUSCULAR | Status: AC | PRN
  Administered 2017-12-14 – 2017-12-15 (×2): 15 mg via INTRAVENOUS
  Filled 2017-12-14 (×2): qty 1

## 2017-12-14 MED ORDER — FENTANYL CITRATE (PF) 100 MCG/2ML IJ SOLN: 50.0000 ug | INTRAMUSCULAR | Status: DC | PRN

## 2017-12-14 MED ORDER — POTASSIUM CHLORIDE 10 MEQ/50ML IV SOLN
10.0000 meq | INTRAVENOUS | Status: AC
  Administered 2017-12-14 (×2): 10 meq via INTRAVENOUS
  Filled 2017-12-14 (×2): qty 50

## 2017-12-14 MED ORDER — FUROSEMIDE 10 MG/ML IJ SOLN
40.0000 mg | Freq: Two times a day (BID) | INTRAMUSCULAR | Status: DC
  Administered 2017-12-14 – 2017-12-15 (×2): 40 mg via INTRAVENOUS
  Filled 2017-12-14 (×3): qty 4

## 2017-12-14 NOTE — Progress Notes (Signed)
CT surgery p.m. Rounds   patient much more comfortable, walking easily today Excellent diuresis Continue current care and probable transfer to stepdown tomorrow O2 4 L nasal cannula

## 2017-12-14 NOTE — Progress Notes (Signed)
2 Days Post-Op Procedure(s) (LRB): MEDIAN STERNOTOMY for CORONARY ARTERY BYPASS GRAFTING (CABG) x 2 (LIMA to LAD, SVG to OM) using RIGHT THIGH GREATER SAPHENOUS VEIN and LEFT INTERNAL MAMMARY ARTERY (N/A) TRANSESOPHAGEAL ECHOCARDIOGRAM (TEE) (N/A) Subjective: Pain control is better DC chest tubes today diuresis nsr Objective: Vital signs in last 24 hours: Temp:  [98 F (36.7 C)-98.5 F (36.9 C)] 98.2 F (36.8 C) (06/08 0754) Pulse Rate:  [71-112] 100 (06/07 1900) Cardiac Rhythm: Normal sinus rhythm (06/08 0800) Resp:  [11-23] 17 (06/08 0800) BP: (90-131)/(50-87) 110/78 (06/08 0800) SpO2:  [87 %-99 %] 96 % (06/08 0800) Arterial Line BP: (71-152)/(54-83) 90/59 (06/08 0500) Weight:  [178 lb 12.7 oz (81.1 kg)] 178 lb 12.7 oz (81.1 kg) (06/08 0500)  Hemodynamic parameters for last 24 hours:    Intake/Output from previous day: 06/07 0701 - 06/08 0700 In: 1118.3 [P.O.:240; I.V.:658.3; NG/GT:20; IV Piggyback:200] Out: 8182 [Urine:1100; Chest Tube:440] Intake/Output this shift: Total I/O In: 280 [P.O.:240; I.V.:40] Out: 42 [Urine:65; Chest Tube:20]       Exam    General- alert and comfortable    Neck- no JVD, no cervical adenopathy palpable, no carotid bruit   Lungs- clear without rales, wheezes   Cor- regular rate and rhythm, no murmur , gallop   Abdomen- soft, non-tender   Extremities - warm, non-tender, minimal edema   Neuro- oriented, appropriate, no focal weakness   Lab Results: Recent Labs    12/13/17 1822 12/14/17 0300  WBC 21.7* 15.0*  HGB 10.3* 8.8*  HCT 32.4* 28.2*  PLT 202 152   BMET:  Recent Labs    12/13/17 0415 12/13/17 1822 12/14/17 0300  NA 143  --  139  K 4.2 4.3 4.3  CL 116*  --  111  CO2 23  --  23  GLUCOSE 110*  --  146*  BUN 14  --  17  CREATININE 0.67 0.63 0.59  CALCIUM 7.8*  --  8.4*    PT/INR:  Recent Labs    12/12/17 1213  LABPROT 16.1*  INR 1.30   ABG    Component Value Date/Time   PHART 7.315 (L) 12/14/2017 0536   HCO3  23.0 12/14/2017 0536   TCO2 24 12/14/2017 0536   ACIDBASEDEF 3.0 (H) 12/14/2017 0536   O2SAT 90.0 12/14/2017 0536   CBG (last 3)  Recent Labs    12/13/17 2059 12/13/17 2330 12/14/17 0752  GLUCAP 173* 142* 151*    Assessment/Plan: S/P Procedure(s) (LRB): MEDIAN STERNOTOMY for CORONARY ARTERY BYPASS GRAFTING (CABG) x 2 (LIMA to LAD, SVG to OM) using RIGHT THIGH GREATER SAPHENOUS VEIN and LEFT INTERNAL MAMMARY ARTERY (N/A) TRANSESOPHAGEAL ECHOCARDIOGRAM (TEE) (N/A) Cont current care Pulmonary status better  LOS: 2 days    Tharon Aquas Trigt III 12/14/2017

## 2017-12-15 ENCOUNTER — Inpatient Hospital Stay (HOSPITAL_COMMUNITY): Payer: PRIVATE HEALTH INSURANCE

## 2017-12-15 LAB — BASIC METABOLIC PANEL
Anion gap: 7 (ref 5–15)
BUN: 15 mg/dL (ref 6–20)
CO2: 28 mmol/L (ref 22–32)
Calcium: 8.3 mg/dL — ABNORMAL LOW (ref 8.9–10.3)
Chloride: 106 mmol/L (ref 101–111)
Creatinine, Ser: 0.67 mg/dL (ref 0.44–1.00)
GFR calc Af Amer: >60 mL/min (ref >60)
GFR calc non Af Amer: >60 mL/min (ref >60)
Glucose, Bld: 143 mg/dL — ABNORMAL HIGH (ref 65–99)
Potassium: 3.4 mmol/L — ABNORMAL LOW (ref 3.5–5.1)
Sodium: 141 mmol/L (ref 135–145)

## 2017-12-15 LAB — CBC
HCT: 25.1 % — ABNORMAL LOW (ref 36.0–46.0)
Hemoglobin: 7.9 g/dL — ABNORMAL LOW (ref 12.0–15.0)
MCH: 29.5 pg (ref 26.0–34.0)
MCHC: 31.5 g/dL (ref 30.0–36.0)
MCV: 93.7 fL (ref 78.0–100.0)
Platelets: 144 10*3/uL — ABNORMAL LOW (ref 150–400)
RBC: 2.68 MIL/uL — ABNORMAL LOW (ref 3.87–5.11)
RDW: 14.8 % (ref 11.5–15.5)
WBC: 10.8 10*3/uL — ABNORMAL HIGH (ref 4.0–10.5)

## 2017-12-15 LAB — GLUCOSE, CAPILLARY
GLUCOSE-CAPILLARY: 133 mg/dL — AB (ref 65–99)
Glucose-Capillary: 154 mg/dL — ABNORMAL HIGH (ref 65–99)
Glucose-Capillary: 133 mg/dL — ABNORMAL HIGH (ref 65–99)

## 2017-12-15 MED ORDER — SODIUM CHLORIDE 0.9% FLUSH: 3.0000 mL | INTRAVENOUS | Status: DC | PRN

## 2017-12-15 MED ORDER — MOVING RIGHT ALONG BOOK
Freq: Once | Status: AC
  Administered 2017-12-15: 12:00:00
  Filled 2017-12-15: qty 1

## 2017-12-15 MED ORDER — POTASSIUM CHLORIDE CRYS ER 20 MEQ PO TBCR
20.0000 meq | EXTENDED_RELEASE_TABLET | Freq: Two times a day (BID) | ORAL | Status: DC
  Administered 2017-12-15 (×2): 20 meq via ORAL
  Filled 2017-12-15 (×2): qty 1

## 2017-12-15 MED ORDER — POTASSIUM CHLORIDE 10 MEQ/50ML IV SOLN
10.0000 meq | INTRAVENOUS | Status: AC
Start: 2017-12-15 — End: 2017-12-15
  Administered 2017-12-15 (×3): 10 meq via INTRAVENOUS
  Filled 2017-12-15 (×3): qty 50

## 2017-12-15 MED ORDER — INSULIN ASPART 100 UNIT/ML ~~LOC~~ SOLN
0.0000 [IU] | Freq: Three times a day (TID) | SUBCUTANEOUS | Status: DC
  Administered 2017-12-15: 2 [IU] via SUBCUTANEOUS
  Administered 2017-12-15: 4 [IU] via SUBCUTANEOUS
  Administered 2017-12-16 (×2): 2 [IU] via SUBCUTANEOUS
  Administered 2017-12-16: 4 [IU] via SUBCUTANEOUS
  Administered 2017-12-16 – 2017-12-17 (×3): 2 [IU] via SUBCUTANEOUS

## 2017-12-15 MED ORDER — FE FUMARATE-B12-VIT C-FA-IFC PO CAPS
1.0000 | ORAL_CAPSULE | Freq: Three times a day (TID) | ORAL | Status: DC
  Administered 2017-12-15 – 2017-12-17 (×7): 1 via ORAL
  Filled 2017-12-15 (×7): qty 1

## 2017-12-15 MED ORDER — FUROSEMIDE 40 MG PO TABS
40.0000 mg | ORAL_TABLET | Freq: Every day | ORAL | Status: DC
  Administered 2017-12-16 – 2017-12-17 (×2): 40 mg via ORAL
  Filled 2017-12-15 (×2): qty 1

## 2017-12-15 MED ORDER — SODIUM CHLORIDE 0.9 % IV SOLN: 250.0000 mL | INTRAVENOUS | Status: DC | PRN

## 2017-12-15 MED ORDER — SODIUM CHLORIDE 0.9% FLUSH
3.0000 mL | Freq: Two times a day (BID) | INTRAVENOUS | Status: DC
  Administered 2017-12-15 – 2017-12-16 (×2): 3 mL via INTRAVENOUS

## 2017-12-15 MED ORDER — ENOXAPARIN SODIUM 30 MG/0.3ML ~~LOC~~ SOLN
30.0000 mg | Freq: Every day | SUBCUTANEOUS | Status: DC
  Administered 2017-12-15 – 2017-12-16 (×2): 30 mg via SUBCUTANEOUS
  Filled 2017-12-15 (×2): qty 0.3

## 2017-12-15 NOTE — Progress Notes (Signed)
3 Days Post-Op Procedure(s) (LRB): MEDIAN STERNOTOMY for CORONARY ARTERY BYPASS GRAFTING (CABG) x 2 (LIMA to LAD, SVG to OM) using RIGHT THIGH GREATER SAPHENOUS VEIN and LEFT INTERNAL MAMMARY ARTERY (N/A) TRANSESOPHAGEAL ECHOCARDIOGRAM (TEE) (N/A) Subjective: Progressing well after cabg Pulmonary status improving tx to 4 [place]  Objective: Vital signs in last 24 hours: Temp:  [98.3 F (36.8 C)-98.6 F (37 C)] 98.6 F (37 C) (06/09 0746) Cardiac Rhythm: Normal sinus rhythm (06/09 0000) Resp:  [11-30] 23 (06/09 0700) BP: (64-127)/(37-94) 102/46 (06/09 0600) SpO2:  [62 %-100 %] 86 % (06/09 0700) Weight:  [175 lb 4.3 oz (79.5 kg)] 175 lb 4.3 oz (79.5 kg) (06/09 0500)  Hemodynamic parameters for last 24 hours:  stable  Intake/Output from previous day: 06/08 0701 - 06/09 0700 In: 1160 [P.O.:600; I.V.:460; IV Piggyback:100] Out: 4782 [Urine:3690; Chest Tube:20] Intake/Output this shift: Total I/O In: 90.3 [I.V.:40.3; IV Piggyback:50] Out: 9562 [Urine:1450]  Lungs clear extremities warm  Lab Results: Recent Labs    12/14/17 0300 12/14/17 1608 12/15/17 0423  WBC 15.0*  --  10.8*  HGB 8.8* 8.2* 7.9*  HCT 28.2* 24.0* 25.1*  PLT 152  --  144*   BMET:  Recent Labs    12/14/17 0300 12/14/17 1608 12/15/17 0423  NA 139 140 141  K 4.3 3.8 3.4*  CL 111 101 106  CO2 23  --  28  GLUCOSE 146* 168* 143*  BUN 17 17 15   CREATININE 0.59 0.60 0.67  CALCIUM 8.4*  --  8.3*    PT/INR:  Recent Labs    12/12/17 1213  LABPROT 16.1*  INR 1.30   ABG    Component Value Date/Time   PHART 7.315 (L) 12/14/2017 0536   HCO3 23.0 12/14/2017 0536   TCO2 26 12/14/2017 1608   ACIDBASEDEF 3.0 (H) 12/14/2017 0536   O2SAT 90.0 12/14/2017 0536   CBG (last 3)  Recent Labs    12/14/17 1219 12/14/17 1942 12/14/17 2317  GLUCAP 132* 96 114*    Assessment/Plan: S/P Procedure(s) (LRB): MEDIAN STERNOTOMY for CORONARY ARTERY BYPASS GRAFTING (CABG) x 2 (LIMA to LAD, SVG to OM) using RIGHT  THIGH GREATER SAPHENOUS VEIN and LEFT INTERNAL MAMMARY ARTERY (N/A) TRANSESOPHAGEAL ECHOCARDIOGRAM (TEE) (N/A) Mobilize Diuresis Diabetes control Plan for transfer to step-down: see transfer orders   LOS: 3 days    Melissa Rubio 12/15/2017

## 2017-12-15 NOTE — Progress Notes (Signed)
Report called to 4E, patient transferred via wheelchair to 4E14.

## 2017-12-15 NOTE — Progress Notes (Signed)
CT surgery p.m. Rounds   Patient doing well, normal sinus rhythm Breathing comfortably, weaning off oxygen as tolerated Transfer orders in place for telemetry unit [place]

## 2017-12-16 ENCOUNTER — Inpatient Hospital Stay (HOSPITAL_COMMUNITY): Payer: PRIVATE HEALTH INSURANCE

## 2017-12-16 LAB — GLUCOSE, CAPILLARY
GLUCOSE-CAPILLARY: 172 mg/dL — AB (ref 65–99)
GLUCOSE-CAPILLARY: 142 mg/dL — AB (ref 65–99)
GLUCOSE-CAPILLARY: 126 mg/dL — AB (ref 65–99)
Glucose-Capillary: 154 mg/dL — ABNORMAL HIGH (ref 65–99)
Glucose-Capillary: 166 mg/dL — ABNORMAL HIGH (ref 65–99)

## 2017-12-16 LAB — CBC
HCT: 30 % — ABNORMAL LOW (ref 36.0–46.0)
HEMOGLOBIN: 9.6 g/dL — AB (ref 12.0–15.0)
MCH: 29.4 pg (ref 26.0–34.0)
MCHC: 32 g/dL (ref 30.0–36.0)
MCV: 91.7 fL (ref 78.0–100.0)
Platelets: 237 10*3/uL (ref 150–400)
RBC: 3.27 MIL/uL — ABNORMAL LOW (ref 3.87–5.11)
RDW: 14.8 % (ref 11.5–15.5)
WBC: 9.2 10*3/uL (ref 4.0–10.5)

## 2017-12-16 LAB — BASIC METABOLIC PANEL
Anion gap: 10 (ref 5–15)
BUN: 9 mg/dL (ref 6–20)
CO2: 24 mmol/L (ref 22–32)
Calcium: 8 mg/dL — ABNORMAL LOW (ref 8.9–10.3)
Chloride: 105 mmol/L (ref 101–111)
Creatinine, Ser: 0.85 mg/dL (ref 0.44–1.00)
GFR calc Af Amer: >60 mL/min (ref >60)
GFR calc non Af Amer: >60 mL/min (ref >60)
Glucose, Bld: 107 mg/dL — ABNORMAL HIGH (ref 65–99)
Potassium: 3.5 mmol/L (ref 3.5–5.1)
Sodium: 139 mmol/L (ref 135–145)

## 2017-12-16 MED ORDER — METFORMIN HCL 500 MG PO TABS
500.0000 mg | ORAL_TABLET | Freq: Two times a day (BID) | ORAL | Status: DC
  Administered 2017-12-16 – 2017-12-17 (×3): 500 mg via ORAL
  Filled 2017-12-16 (×3): qty 1

## 2017-12-16 MED ORDER — KETOROLAC TROMETHAMINE 30 MG/ML IJ SOLN
15.0000 mg | Freq: Four times a day (QID) | INTRAMUSCULAR | Status: AC
Start: 2017-12-16 — End: 2017-12-16
  Administered 2017-12-16 (×2): 15 mg via INTRAVENOUS
  Filled 2017-12-16 (×2): qty 1

## 2017-12-16 MED ORDER — POTASSIUM CHLORIDE CRYS ER 10 MEQ PO TBCR
30.0000 meq | EXTENDED_RELEASE_TABLET | Freq: Two times a day (BID) | ORAL | Status: DC
  Administered 2017-12-16 – 2017-12-17 (×3): 30 meq via ORAL
  Filled 2017-12-16 (×3): qty 3

## 2017-12-16 NOTE — Progress Notes (Signed)
CARDIAC REHAB PHASE I   PRE:  Rate/Rhythm: 104 ST  BP:  Sitting: 114/66      SaO2:   MODE:  Ambulation: 300 ft   POST:  Rate/Rhythm: 103 ST  BP:  Sitting: 126/76    SaO2:   Pt ambulated 357ft in hallway standby assist with front wheel rolling walker with 3 standing rest breaks. Pt with hobbily gait c/o hip and back pain. Pt demonstrated 750 on IS. Pt encouraged to walk twice more today and to continue using IS. Pt in bed, call bell and bedside table within reach.   [phone] Rufina Falco, RN BSN 12/16/2017 8:47 AM

## 2017-12-16 NOTE — Progress Notes (Addendum)
      [address]       [place] [address]             [phone]        4 Days Post-Op Procedure(s) (LRB): MEDIAN STERNOTOMY for CORONARY ARTERY BYPASS GRAFTING (CABG) x 2 (LIMA to LAD, SVG to OM) using RIGHT THIGH GREATER SAPHENOUS VEIN and LEFT INTERNAL MAMMARY ARTERY (N/A) TRANSESOPHAGEAL ECHOCARDIOGRAM (TEE) (N/A)  Subjective: Patient with a lot of sternal incisional pain this am.  Objective: Vital signs in last 24 hours: Temp:  [98.4 F (36.9 C)-99 F (37.2 C)] 98.5 F (36.9 C) (06/10 0348) Pulse Rate:  [87-100] 100 (06/10 0348) Cardiac Rhythm: Normal sinus rhythm (06/09 2202) Resp:  [14-19] 16 (06/10 0348) BP: (90-151)/(65-92) 151/92 (06/10 0348) SpO2:  [82 %-98 %] 97 % (06/09 2035) Weight:  [172 lb 9.6 oz (78.3 kg)] 172 lb 9.6 oz (78.3 kg) (06/09 2035)  Pre op weight 78 kg Current Weight  12/15/17 172 lb 9.6 oz (78.3 kg)    Intake/Output from previous day: 06/09 0701 - 06/10 0700 In: 924 [P.O.:600; I.V.:274; IV Piggyback:50] Out: 6945 [Urine:3550]   Physical Exam:  Cardiovascular: Slightly tachycardic Pulmonary: Diminished at bases Abdomen: Soft, non tender, bowel sounds present. Extremities: Mild bilateral lower extremity edema. Ecchymosis right thigh. Wounds: Clean and dry.  No erythema or signs of infection.  Lab Results: CBC: Recent Labs    12/14/17 0300 12/14/17 1608 12/15/17 0423  WBC 15.0*  --  10.8*  HGB 8.8* 8.2* 7.9*  HCT 28.2* 24.0* 25.1*  PLT 152  --  144*   BMET:  Recent Labs    12/15/17 0423 12/16/17 0500  NA 141 139  K 3.4* 3.5  CL 106 105  CO2 28 24  GLUCOSE 143* 107*  BUN 15 9  CREATININE 0.67 0.85  CALCIUM 8.3* 8.0*    PT/INR:  Lab Results  Component Value Date   INR 1.30 12/12/2017   INR 0.96 12/10/2017   INR 0.96 09/01/2017   ABG:  INR: Will add last result for INR, ABG once components are confirmed Will add last 4 CBG results once components are confirmed  Assessment/Plan:  1. CV -  Mostly in SR. Slightly tachy this am as has sternal pain.  On Coreg 3.125 mg bid, DAPT (baby ecasa and Plavix 75 mg daily). 2.  Pulmonary - On 2 liters via [place]. Wean to room air as tolerates. CXR appears to show no pneumothorax, small right pleural effusion and bibasilar atelectasis.Encourage incentive spirometer. 3. Volume Overload - On Lasix 40 mg daily 4.  Acute blood loss anemia - H and H yesterday 7.9 and 25.1. Await this am's CBC results. On Trinsicon. 5. Supplement potassium 6. DM-CBGs 133/133/142 . On Insulin. Will restart Metformin. Pre op HGA1C 7.1. 7. Remove EPW 8. Regarding pain control, will give 2 doses of Toradol.  9. Possible discharge in am  Donielle M ZimmermanPA-C 12/16/2017,7:07 AM [phone]  cxr looks fine- personally viewed Wean O2 before DC Agree with above note  P Prescott Gum MD

## 2017-12-16 NOTE — Care Management Note (Signed)
Case Management Note [name] RN, BSN Unit 4E-Case Manager [phone]  Patient Details  Name: Melissa Rubio MRN: [id number] Date of Birth: 10-29-1970  Subjective/Objective:  Pt admitted s/p CABGx2                  Action/Plan: PTA pt lived at home with spouse- anticipate return home, CM to follow for transition of care needs- PCP- Kenn File  Expected Discharge Date:  12/19/17               Expected Discharge Plan:  Home/Self Care  In-House Referral:  NA  Discharge planning Services  CM Consult  Post Acute Care Choice:    Choice offered to:     DME Arranged:    DME Agency:     HH Arranged:    HH Agency:     Status of Service:  In process, will continue to follow  If discussed at Long Length of Stay Meetings, dates discussed:    Discharge Disposition:   Additional Comments:  [name], RN 12/16/2017, 10:36 AM

## 2017-12-16 NOTE — Progress Notes (Signed)
Patient ambulated 4 times in the hall on this shift, ambulation well tolerated will continue to monitor.

## 2017-12-16 NOTE — Progress Notes (Signed)
Patient education completed for removal of pacer wires, patient verbalized understanding, pacing wires removed without difficulty, tips of wires intact, patient now on bed rest , call bell within reach, bed in lowest position, will monitor.

## 2017-12-17 LAB — GLUCOSE, CAPILLARY
GLUCOSE-CAPILLARY: 156 mg/dL — AB (ref 65–99)
Glucose-Capillary: 149 mg/dL — ABNORMAL HIGH (ref 65–99)

## 2017-12-17 MED ORDER — POTASSIUM CHLORIDE CRYS ER 20 MEQ PO TBCR: 20.0000 meq | EXTENDED_RELEASE_TABLET | Freq: Two times a day (BID) | ORAL | 0 refills | Status: DC

## 2017-12-17 MED ORDER — CLOPIDOGREL BISULFATE 75 MG PO TABS: 75.0000 mg | ORAL_TABLET | Freq: Every day | ORAL | 1 refills | Status: DC

## 2017-12-17 MED ORDER — OXYCODONE HCL 5 MG PO TABS: 5.0000 mg | ORAL_TABLET | ORAL | 0 refills | Status: DC | PRN

## 2017-12-17 MED ORDER — POTASSIUM CHLORIDE CRYS ER 20 MEQ PO TBCR: 20.0000 meq | EXTENDED_RELEASE_TABLET | Freq: Every day | ORAL | 0 refills | Status: DC

## 2017-12-17 MED ORDER — FUROSEMIDE 40 MG PO TABS: 40.0000 mg | ORAL_TABLET | Freq: Every day | ORAL | 0 refills | Status: DC

## 2017-12-17 MED ORDER — FERROUS SULFATE 325 (65 FE) MG PO TABS: 325.0000 mg | ORAL_TABLET | Freq: Every day | ORAL | 3 refills | Status: DC

## 2017-12-17 MED FILL — Sodium Bicarbonate IV Soln 8.4%: INTRAVENOUS | Qty: 50 | Status: AC

## 2017-12-17 MED FILL — Heparin Sodium (Porcine) Inj 1000 Unit/ML: INTRAMUSCULAR | Qty: 20 | Status: AC

## 2017-12-17 MED FILL — Electrolyte-R (PH 7.4) Solution: INTRAVENOUS | Qty: 4000 | Status: AC

## 2017-12-17 MED FILL — Lidocaine HCl Local Soln Prefilled Syringe 100 MG/5ML (2%): INTRAMUSCULAR | Qty: 5 | Status: AC

## 2017-12-17 MED FILL — Sodium Chloride IV Soln 0.9%: INTRAVENOUS | Qty: 2000 | Status: AC

## 2017-12-17 MED FILL — Mannitol IV Soln 20%: INTRAVENOUS | Qty: 500 | Status: AC

## 2017-12-17 NOTE — Progress Notes (Addendum)
      [address]       [place] [address]             [phone]        5 Days Post-Op Procedure(s) (LRB): MEDIAN STERNOTOMY for CORONARY ARTERY BYPASS GRAFTING (CABG) x 2 (LIMA to LAD, SVG to OM) using RIGHT THIGH GREATER SAPHENOUS VEIN and LEFT INTERNAL MAMMARY ARTERY (N/A) TRANSESOPHAGEAL ECHOCARDIOGRAM (TEE) (N/A)  Subjective: Patient just waking up this am. She has no specific complaints  Objective: Vital signs in last 24 hours: Temp:  [98 F (36.7 C)-98.7 F (37.1 C)] 98 F (36.7 C) (06/11 0500) Pulse Rate:  [81-103] 103 (06/11 0500) Cardiac Rhythm: Normal sinus rhythm;Sinus tachycardia (06/10 2015) Resp:  [15-22] 22 (06/11 0500) BP: (87-134)/(53-76) 134/66 (06/11 0500) SpO2:  [87 %-99 %] 91 % (06/11 0500) Weight:  [170 lb 9.6 oz (77.4 kg)] 170 lb 9.6 oz (77.4 kg) (06/11 0500)  Pre op weight 78 kg Current Weight  12/17/17 170 lb 9.6 oz (77.4 kg)    Intake/Output from previous day: 06/10 0701 - 06/11 0700 In: 240 [P.O.:240] Out: -    Physical Exam:  Cardiovascular: RRR Pulmonary: Mostly clear bilaterally Abdomen: Soft, non tender, bowel sounds present. Extremities: Trace bilateral lower extremity edema. Ecchymosis right thigh. Wounds: Clean and dry.  No erythema or signs of infection.  Lab Results: CBC: Recent Labs    12/15/17 0423 12/16/17 0750  WBC 10.8* 9.2  HGB 7.9* 9.6*  HCT 25.1* 30.0*  PLT 144* 237   BMET:  Recent Labs    12/15/17 0423 12/16/17 0500  NA 141 139  K 3.4* 3.5  CL 106 105  CO2 28 24  GLUCOSE 143* 107*  BUN 15 9  CREATININE 0.67 0.85  CALCIUM 8.3* 8.0*    PT/INR:  Lab Results  Component Value Date   INR 1.30 12/12/2017   INR 0.96 12/10/2017   INR 0.96 09/01/2017   ABG:  INR: Will add last result for INR, ABG once components are confirmed Will add last 4 CBG results once components are confirmed  Assessment/Plan:  1. CV - Had a brief run of SVT yesterday. Mostly in SR, however.  On Coreg  3.125 mg bid, DAPT (baby ecasa and Plavix 75 mg daily).  2.  Pulmonary - On room air..Encourage incentive spirometer. 3. Volume Overload - On Lasix 40 mg daily 4.  Acute blood loss anemia - H and H yesterday increased to 9.6 and 30. On Trinsicon. 5. DM-CBGs 166/126/149 . On Insulin. Will restart Metformin. Pre op HGA1C 7.1. 6. As discussed with Dr. Prescott Gum, will discharge  [facility] ZimmermanPA-C 12/17/2017,7:14 AM [phone]

## 2017-12-17 NOTE — Progress Notes (Signed)
Discharge education completed with pt and husband. Reviewed showering and incision care, along with sternal precautions and restrictions. Pt given move in the tube sheet, heart health and diabetic diet. Pt demonstrated flutter valve and IS and states she uses during commercial breaks. Pt given fake cigarette and quitting smoking tip sheet. Will refer to CRP II [place].   [phone] Rufina Falco, RN BSN 12/17/2017 11:23 AM

## 2017-12-17 NOTE — Progress Notes (Signed)
Patient in a stable condition, discharge education reviewed with patient , she verbalized understanding, iv removed, tele dc ccmd notified, prescriptions given to patient, patient belongings at bedside, patient's husband to transport patient home.

## 2017-12-17 NOTE — Progress Notes (Signed)
Patient ambulated 414ft on room air, ambulation well tolerated maintained an O2 sat greater than 92% during and after ambulation.

## 2017-12-27 ENCOUNTER — Encounter: Payer: Self-pay | Admitting: Cardiology

## 2017-12-27 ENCOUNTER — Ambulatory Visit (INDEPENDENT_AMBULATORY_CARE_PROVIDER_SITE_OTHER): Payer: PRIVATE HEALTH INSURANCE | Admitting: Cardiology

## 2017-12-27 VITALS — BP 111/74 | HR 89 | Ht 60.0 in | Wt 155.8 lb

## 2017-12-27 DIAGNOSIS — I2510 Atherosclerotic heart disease of native coronary artery without angina pectoris: Secondary | ICD-10-CM

## 2017-12-27 DIAGNOSIS — I739 Peripheral vascular disease, unspecified: Secondary | ICD-10-CM | POA: Diagnosis not present

## 2017-12-27 DIAGNOSIS — R002 Palpitations: Secondary | ICD-10-CM

## 2017-12-27 NOTE — Patient Instructions (Signed)
Medication Instructions:  Continue all current medications.  Labwork: none  Testing/Procedures: none  Follow-Up: 3 months   Any Other Special Instructions Will Be Listed Below (If Applicable).  If you need a refill on your cardiac medications before your next appointment, please call your pharmacy.  

## 2017-12-27 NOTE — Progress Notes (Signed)
Clinical Summary Ms. Boissonneault is a 47 y.o.female seen today for follow up of the following medical problems.   1. 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 Full report as indicated below, received DES to RCA. LM 50%, LAD 65%, LCX 80% treated medically.  - 08/2017 echo LVEF 55-60%, inferior hypokinesis - medical therapy limited due to soft bp's during admission.   - referred to CT surgery given remaining LM/LAD and LCX disease - s/p CABG 12/12/17 with LIMA-LAD, SVG OM - changed from brillinta to plavix due to cost - 11/2017 echo LVEF 55-60%, no WMAs   - since discharge some pain at surgical site. Otherwise doing well - compliant with meds.    2. Palpitations - occasional palpitations, 2-3 times per day - last about 15 minutes. Most often come on with activity or stress. Can have some SOB associated - coffee x 3 pots daily.    3. PAD - prior to CABG ABI right 0.62, left 1.12 - can some claudication like symptoms   Past Medical History:  Diagnosis Date  . Anxiety   . [facility]) 1994   cervical  . Coronary artery disease    2/19 PCI/DES x1 to dRCA with LM, LAD disease (planned for possible CABG in near future), normal EF  . Coronary artery disease involving native coronary artery of native heart with angina pectoris ([place])   . Diabetes ([place])   . Hypertension   . Itching   . Myocardial infarction ([place])   . RLS (restless legs syndrome)   . 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 Tobacco use      No Known Allergies   Current Outpatient Medications  Medication Sig Dispense Refill  . acetaminophen (TYLENOL) 325 MG tablet Take 650 mg by mouth 2 (two) times daily as needed for moderate pain or headache.    . ALPRAZolam (XANAX) 0.5 MG tablet Take 1 tablet (0.5 mg total) by mouth 3 (three) times daily as needed for anxiety. 75 tablet 2  . aspirin 81 MG chewable tablet Chew 1 tablet (81 mg total) by mouth daily.    Marland Kitchen atorvastatin (LIPITOR) 80 MG tablet TAKE 1 TABLET BY  MOUTH ONCE DAILY AT  6PM 90 tablet 1  . busPIRone (BUSPAR) 10 MG tablet Take 1 tablet (10 mg total) by mouth 3 (three) times daily. 90 tablet 1  . carvedilol (COREG) 3.125 MG tablet Take 1 tablet (3.125 mg total) by mouth 2 (two) times daily with a meal. 180 tablet 3  . citalopram (CELEXA) 40 MG tablet TAKE 1 TABLET BY MOUTH ONCE DAILY 90 tablet 0  . clopidogrel (PLAVIX) 75 MG tablet Take 1 tablet (75 mg total) by mouth daily. 30 tablet 1  . cyclobenzaprine (FLEXERIL) 10 MG tablet Take 1 tablet (10 mg total) by mouth 3 (three) times daily as needed for muscle spasms. 30 tablet 1  . ezetimibe (ZETIA) 10 MG tablet Take 1 tablet (10 mg total) by mouth daily. 30 tablet 0  . ferrous sulfate 325 (65 FE) MG tablet Take 1 tablet (325 mg total) by mouth daily with breakfast. For one month then stop. 30 tablet 3  . furosemide (LASIX) 40 MG tablet Take 1 tablet (40 mg total) by mouth daily. For 5 days then stop. 5 tablet 0  . gabapentin (NEURONTIN) 800 MG tablet Take 1 tablet (800 mg total) by mouth 4 (four) times daily. 120 tablet 5  . hydrOXYzine (ATARAX/VISTARIL) 50 MG tablet  Take 1 tablet (50 mg total) by mouth every 8 (eight) hours as needed. (Patient taking differently: Take 50 mg by mouth every 8 (eight) hours as needed for anxiety or itching. ) 270 tablet 3  . insulin NPH Human (HUMULIN N,NOVOLIN N) 100 UNIT/ML injection Inject 0.4 mLs (40 Units total) 2 (two) times daily before a meal into the skin. (Patient taking differently: Inject 30-40 Units into the skin See admin instructions. 40 units in the morning and 30 units at night) 30 mL 3  . INSULIN SYRINGE .5CC/29G 29G X 1/2" 0.5 ML MISC 1 Syringe by Does not apply route 2 (two) times daily. 100 each 11  . Menthol, Topical Analgesic, (ICY HOT EX) Apply 1 application topically daily as needed (back pain).    . metFORMIN (GLUCOPHAGE) 1000 MG tablet Take 1 tablet (1,000 mg total) by mouth 2 (two) times daily with a meal. 60 tablet 11  . nicotine (NICODERM  CQ - DOSED IN MG/24 HOURS) 21 mg/24hr patch Place 1 patch (21 mg total) onto the skin daily. 28 patch 0  . omeprazole (PRILOSEC) 20 MG capsule Take 20 mg by mouth daily.    Marland Kitchen oxyCODONE (OXY IR/ROXICODONE) 5 MG immediate release tablet Take 1 tablet (5 mg total) by mouth every 4 (four) hours as needed for severe pain. 30 tablet 0  . potassium chloride SA (K-DUR,KLOR-CON) 20 MEQ tablet Take 1 tablet (20 mEq total) by mouth daily. For 5 days then stop. 5 tablet 0   No current facility-administered medications for this visit.      Past Surgical History:  Procedure Laterality Date  . CARDIAC CATHETERIZATION    . CORONARY ARTERY BYPASS GRAFT N/A 12/12/2017   Procedure: MEDIAN STERNOTOMY for CORONARY ARTERY BYPASS GRAFTING (CABG) x 2 (LIMA to LAD, SVG to OM) using RIGHT THIGH GREATER SAPHENOUS VEIN and LEFT INTERNAL MAMMARY ARTERY;  Surgeon: Rexene Alberts, MD;  Location: [facility];  Service: Open Heart Surgery;  Laterality: N/A;  . CORONARY STENT INTERVENTION N/A 09/01/2017   Procedure: CORONARY STENT INTERVENTION;  Surgeon: Martinique, Peter M, MD;  Location: [facility];  Service: Cardiovascular;  Laterality: N/A;  . CORONARY/GRAFT ACUTE MI REVASCULARIZATION N/A 09/01/2017   Procedure: Coronary/Graft Acute MI Revascularization;  Surgeon: Martinique, Peter M, MD;  Location: [facility];  Service: Cardiovascular;  Laterality: N/A;  . LEFT HEART CATH AND CORONARY ANGIOGRAPHY N/A 09/01/2017   Procedure: LEFT HEART CATH AND CORONARY ANGIOGRAPHY;  Surgeon: Martinique, Peter M, MD;  Location: [facility];  Service: Cardiovascular;  Laterality: N/A;  . PARTIAL HYSTERECTOMY  2009  . TEE WITHOUT CARDIOVERSION N/A 12/12/2017   Procedure: TRANSESOPHAGEAL ECHOCARDIOGRAM (TEE);  Surgeon: Rexene Alberts, MD;  Location: [facility];  Service: Open Heart Surgery;  Laterality: N/A;     No Known Allergies    Family History  Problem Relation Age of Onset  . Cancer Mother   . Heart attack Father   . Diabetes  Sister   . Diabetes Brother   . Diabetes Sister   . Heart attack Sister      Social History Ms. Nelson reports that she has been smoking cigarettes.  She started smoking about 36 years ago. She has a 26.25 pack-year smoking history. She has never used smokeless tobacco. Ms. Nachreiner reports that she does not drink alcohol.   Review of Systems CONSTITUTIONAL: No weight loss, fever, chills, weakness or fatigue.  HEENT: Eyes: No visual loss, blurred vision, double vision or yellow sclerae.No hearing  loss, sneezing, congestion, runny nose or sore throat.  SKIN: No rash or itching.  CARDIOVASCULAR: per hpi RESPIRATORY: No shortness of breath, cough or sputum.  GASTROINTESTINAL: No anorexia, nausea, vomiting or diarrhea. No abdominal pain or blood.  GENITOURINARY: No burning on urination, no polyuria NEUROLOGICAL: No headache, dizziness, syncope, paralysis, ataxia, numbness or tingling in the extremities. No change in bowel or bladder control.  MUSCULOSKELETAL:per hpi LYMPHATICS: No enlarged nodes. No history of splenectomy.  PSYCHIATRIC: No history of depression or anxiety.  ENDOCRINOLOGIC: No reports of sweating, cold or heat intolerance. No polyuria or polydipsia.  Marland Kitchen   Physical Examination Vitals:   12/27/17 1451  BP: 111/74  Pulse: 89  SpO2: 98%   Vitals:   12/27/17 1451  Weight: 155 lb 12.8 oz (70.7 kg)  Height: 5' (1.524 m)    Gen: resting comfortably, no acute distress HEENT: no scleral icterus, pupils equal round and reactive, no palptable cervical adenopathy,  CV: RRR, no m/r/g, no jvd Resp: Clear to auscultation bilaterally GI: abdomen is soft, non-tender, non-distended, normal bowel sounds, no hepatosplenomegaly MSK: extremities are warm, no edema.  Skin: warm, no rash Neuro:  no focal deficits Psych: appropriate affect   Diagnostic Studies 08/2017 cath  Dist RCA lesion is 100% stenosed.  Prox RCA to Mid RCA lesion is 25% stenosed.  Dist LM-2 lesion is 50%  stenosed.  Dist LM to Ost LAD lesion is 65% stenosed.  Ost Cx to Prox Cx lesion is 80% stenosed.  Post intervention, there is a 0% residual stenosis.  A drug-eluting stent was successfully placed using a STENT SYNERGY DES 2.5X32.  The left ventricular systolic function is normal.  LV end diastolic pressure is mildly elevated.  The left ventricular ejection fraction is 55-65% by visual estimate.  1. 3 vessel obstructive CAD. 100% distal RCA is the culprit vessel. She also has 50% distal left main, 65% ostial LAD, and 80% ostial LCx 2. Inferior wall motion abnormality with preserved LV systolic function 3. Mildly elevated LVEDP 4. Successful stenting of the distal RCA with DES  Plan: DAPT for one year. Will need to review films with interventional colleagues to decide management of residual disease in LCA. For now will treat medically. It is not well suited for PCI. Will need to decide whether CABG is needed after she has healed from MI.   Carotid/ABI preCABG [place] Final Interpretation: Right Carotid: Velocities in the right ICA are consistent with a 1-39% stenosis.  Left Carotid: Velocities in the left ICA are consistent with a 1-39% stenosis. Vertebrals: Bilateral vertebral arteries demonstrate antegrade flow.  Right ABI: Resting right ankle-brachial index indicates moderate right lower extremity arterial disease.  Left ABI: Resting left ankle-brachial index is within normal range. No evidence of significant left lower extremity arterial disease.   Right Upper Extremity: Radial Doppler waveforms decrease >50% with compression. Ulnar Doppler waveforms remain within normal limits with compression.  Left Upper Extremity: Radial and Ulnar Doppler waveforms remain within normal limits with compression.     Assessment and Plan  1. CAD - recent inferior STEMI, s/p stenting. She has residual LM 50% and ostial LAD 65%, LCX 80%. She has recently had a CABG for this remaining  disease - doing well post CABG, continue currentm eds   2. Palpitations - wean caffeine and monitor symptoms  3. PAD - abnormal ABIs, some claudication symptoms. Not life limiting - continue to monitor at this time, she is still recovering from recent CABG - may consider trial of  cilostazol vs vascular referral at next f/u        Arnoldo Lenis, M.D.

## 2018-01-07 ENCOUNTER — Encounter: Payer: Self-pay | Admitting: Cardiology

## 2018-01-13 ENCOUNTER — Ambulatory Visit: Payer: PRIVATE HEALTH INSURANCE | Admitting: Thoracic Surgery (Cardiothoracic Vascular Surgery)

## 2018-01-20 ENCOUNTER — Ambulatory Visit
Admission: RE | Admit: 2018-01-20 | Discharge: 2018-01-20 | Disposition: A | Discharge status: Home / Self Care | Payer: PRIVATE HEALTH INSURANCE | Source: Ambulatory Visit | Attending: Thoracic Surgery (Cardiothoracic Vascular Surgery) | Admitting: Thoracic Surgery (Cardiothoracic Vascular Surgery)

## 2018-01-20 ENCOUNTER — Encounter: Payer: Self-pay | Admitting: Physician Assistant

## 2018-01-20 ENCOUNTER — Other Ambulatory Visit: Payer: Self-pay | Admitting: Thoracic Surgery (Cardiothoracic Vascular Surgery)

## 2018-01-20 ENCOUNTER — Ambulatory Visit (INDEPENDENT_AMBULATORY_CARE_PROVIDER_SITE_OTHER): Payer: Self-pay | Admitting: Physician Assistant

## 2018-01-20 VITALS — BP 111/80 | HR 92 | Resp 20 | Ht 60.0 in | Wt 157.0 lb

## 2018-01-20 DIAGNOSIS — Z951 Presence of aortocoronary bypass graft: Secondary | ICD-10-CM

## 2018-01-20 DIAGNOSIS — I2510 Atherosclerotic heart disease of native coronary artery without angina pectoris: Secondary | ICD-10-CM

## 2018-01-20 NOTE — Patient Instructions (Signed)
You may return to driving an automobile as long as you are no longer requiring oral narcotic pain relievers during the daytime.  It would be wise to start driving only short distances during the daylight and gradually increase from there as you feel comfortable.  You may continue to gradually increase your physical activity as tolerated.  Refrain from any heavy lifting or strenuous use of your arms and shoulders until at least 8 weeks from the time of your surgery, and avoid activities that cause increased pain in your chest on the side of your surgical incision.  Otherwise you may continue to increase activities without any particular limitations.  Increase the intensity and duration of physical activity gradually.

## 2018-01-20 NOTE — Progress Notes (Signed)
HPI: Patient returns for routine postoperative follow-up having undergone CABG x 3 on 6/619.  The patient's early postoperative recovery while in the hospital was unremarkable. Since hospital discharge the patient reports she is doing pretty well.  She states that she does have some burning/numbness in her right leg and across her chest.  These symptoms have improved some since hospital admission.  She continues to smoke occasionally and is trying to quit and I have encouraged her to make the effort to stop completely.  She is ambulating without much difficulty, but does admit she doesn't walk as often as she should because of the heat.  She states her sugars have been pretty well controlled.  She asks if she can go on a cruise in November.   Current Outpatient Medications  Medication Sig Dispense Refill  . acetaminophen (TYLENOL) 325 MG tablet Take 650 mg by mouth 2 (two) times daily as needed for moderate pain or headache.    . ALPRAZolam (XANAX) 0.5 MG tablet Take 1 tablet (0.5 mg total) by mouth 3 (three) times daily as needed for anxiety. 75 tablet 2  . aspirin 81 MG chewable tablet Chew 1 tablet (81 mg total) by mouth daily.    Marland Kitchen atorvastatin (LIPITOR) 80 MG tablet TAKE 1 TABLET BY MOUTH ONCE DAILY AT  6PM 90 tablet 1  . busPIRone (BUSPAR) 10 MG tablet Take 1 tablet (10 mg total) by mouth 3 (three) times daily. 90 tablet 1  . carvedilol (COREG) 3.125 MG tablet Take 1 tablet (3.125 mg total) by mouth 2 (two) times daily with a meal. 180 tablet 3  . citalopram (CELEXA) 40 MG tablet TAKE 1 TABLET BY MOUTH ONCE DAILY 90 tablet 0  . clopidogrel (PLAVIX) 75 MG tablet Take 1 tablet (75 mg total) by mouth daily. 30 tablet 1  . cyclobenzaprine (FLEXERIL) 10 MG tablet Take 1 tablet (10 mg total) by mouth 3 (three) times daily as needed for muscle spasms. 30 tablet 1  . ezetimibe (ZETIA) 10 MG tablet Take 1 tablet (10 mg total) by mouth daily. 30 tablet 0  . ferrous sulfate 325 (65 FE) MG tablet Take 1  tablet (325 mg total) by mouth daily with breakfast. For one month then stop. 30 tablet 3  . gabapentin (NEURONTIN) 800 MG tablet Take 1 tablet (800 mg total) by mouth 4 (four) times daily. 120 tablet 5  . hydrOXYzine (ATARAX/VISTARIL) 50 MG tablet Take 1 tablet (50 mg total) by mouth every 8 (eight) hours as needed. (Patient taking differently: Take 50 mg by mouth every 8 (eight) hours as needed for anxiety or itching. ) 270 tablet 3  . insulin NPH Human (HUMULIN N,NOVOLIN N) 100 UNIT/ML injection Inject 0.4 mLs (40 Units total) 2 (two) times daily before a meal into the skin. (Patient taking differently: Inject 30-40 Units into the skin See admin instructions. 40 units in the morning and 30 units at night) 30 mL 3  . INSULIN SYRINGE .5CC/29G 29G X 1/2" 0.5 ML MISC 1 Syringe by Does not apply route 2 (two) times daily. 100 each 11  . Menthol, Topical Analgesic, (ICY HOT EX) Apply 1 application topically daily as needed (back pain).    . metFORMIN (GLUCOPHAGE) 1000 MG tablet Take 1 tablet (1,000 mg total) by mouth 2 (two) times daily with a meal. 60 tablet 11  . nicotine (NICODERM CQ - DOSED IN MG/24 HOURS) 21 mg/24hr patch Place 1 patch (21 mg total) onto the skin daily. 28 patch 0  .  omeprazole (PRILOSEC) 20 MG capsule Take 20 mg by mouth daily.    Marland Kitchen oxyCODONE (OXY IR/ROXICODONE) 5 MG immediate release tablet Take 1 tablet (5 mg total) by mouth every 4 (four) hours as needed for severe pain. 30 tablet 0   No current facility-administered medications for this visit.     Physical Exam:  BP 111/80   Pulse 92   Resp 20   Ht 5' (1.524 m)   Wt 157 lb (71.2 kg)   LMP 04/17/2014   SpO2 97% Comment: RA  BMI 30.66 kg/m   Gen; no apparent distress Heart: RRR Lungs: CTA bilaterally, sternum stable Ext: no edema present Incisions: well healed, small scab remains in middle of sternotomy  Diagnostic Tests:  CXR: no pleural effusions, no pneumothorax, sternal wires in place  A/P:  1. S/P CABG  x 2- doing very well.  She is tachycardic today, but her BP is a little on the low side in the 110s.. Will continue current dose of Coreg.  If it remains elevated in the future and her BP allows, her Coreg can be titrated 2. DM- per patient sugars well controlled.  Discussed importance of good glucose control for longevity of her vein grafts 3. Nicotine abuse- encouraged patient to continue to quit smoking 4. Activity- may resume driving, increase ambulation as able.. Activity may increase with lifting restrictions in place for 6-8 weeks from date of surgery.. In regards to going on cruise in November, shouldn't be an issue, but will reassess at her appointment in September with Dr. Roxy Manns 5. RTC in September  Ellwood Handler, PA-C [facility] [phone]

## 2018-01-21 ENCOUNTER — Encounter: Payer: Self-pay | Admitting: Family Medicine

## 2018-01-21 ENCOUNTER — Ambulatory Visit (INDEPENDENT_AMBULATORY_CARE_PROVIDER_SITE_OTHER): Payer: No Typology Code available for payment source | Admitting: Family Medicine

## 2018-01-21 VITALS — BP 133/93 | HR 94 | Temp 97.4°F | Ht 60.0 in | Wt 157.8 lb

## 2018-01-21 DIAGNOSIS — G8929 Other chronic pain: Secondary | ICD-10-CM | POA: Diagnosis not present

## 2018-01-21 DIAGNOSIS — E1165 Type 2 diabetes mellitus with hyperglycemia: Secondary | ICD-10-CM | POA: Diagnosis not present

## 2018-01-21 DIAGNOSIS — F419 Anxiety disorder, unspecified: Secondary | ICD-10-CM | POA: Diagnosis not present

## 2018-01-21 DIAGNOSIS — M545 Low back pain: Secondary | ICD-10-CM | POA: Diagnosis not present

## 2018-01-21 MED ORDER — CLOPIDOGREL BISULFATE 75 MG PO TABS: 75.0000 mg | ORAL_TABLET | Freq: Every day | ORAL | 3 refills | Status: DC

## 2018-01-21 MED ORDER — BUSPIRONE HCL 10 MG PO TABS: 10.0000 mg | ORAL_TABLET | Freq: Three times a day (TID) | ORAL | 3 refills | Status: DC

## 2018-01-21 MED ORDER — METFORMIN HCL 1000 MG PO TABS: 1000.0000 mg | ORAL_TABLET | Freq: Two times a day (BID) | ORAL | 11 refills | Status: DC

## 2018-01-21 MED ORDER — CYCLOBENZAPRINE HCL 10 MG PO TABS: 10.0000 mg | ORAL_TABLET | Freq: Three times a day (TID) | ORAL | 1 refills | Status: DC | PRN

## 2018-01-21 MED ORDER — ALPRAZOLAM 0.5 MG PO TABS: 0.5000 mg | ORAL_TABLET | Freq: Three times a day (TID) | ORAL | 2 refills | Status: DC | PRN

## 2018-01-21 MED ORDER — CITALOPRAM HYDROBROMIDE 40 MG PO TABS: 40.0000 mg | ORAL_TABLET | Freq: Every day | ORAL | 3 refills | Status: DC

## 2018-01-21 MED ORDER — CARVEDILOL 3.125 MG PO TABS: 3.1250 mg | ORAL_TABLET | Freq: Two times a day (BID) | ORAL | 3 refills | Status: DC

## 2018-01-21 NOTE — Progress Notes (Signed)
   HPI  Patient presents today here for follow-up chronic medical conditions.  Anxiety Doing well with Celexa plus BuSpar, also with Xanax, needs refill.  Patient has had a recent CABG, her postop pain is improving. She reports good medication compliance with Lipitor, Coreg, and Plavix. She needs refills of Lipitor and Plavix.   Low back pain-needs refill of Flexeril. Uses ibuprofen for this.  PMH: Smoking status noted ROS: Per HPI  Objective: BP (!) 133/93   Pulse 94   Temp (!) 97.4 F (36.3 C) (Oral)   Ht 5' (1.524 m)   Wt 157 lb 12.8 oz (71.6 kg)   LMP 04/17/2014   BMI 30.82 kg/m  Gen: NAD, alert, cooperative with exam HEENT: NCAT CV: RRR, good S1/S2, no murmur Resp: CTABL, no wheezes, non-labored Ext: No edema, warm Neuro: Alert and oriented, No gross deficits  Assessment and plan:  #Anxiety Well-controlled on BuSpar plus citalopram plus Xanax, refilled Xanax  #Chronic low back pain Recommended discontinuing high-dose Motrin given recent heart attack, discussed risk of MI or stroke. Tylenol, supportive care heat ice, topical treatments.  Type 2 diabetes A1c checked in the hospital, controlled Refill metformin  Meds ordered this encounter  Medications  . metFORMIN (GLUCOPHAGE) 1000 MG tablet    Sig: Take 1 tablet (1,000 mg total) by mouth 2 (two) times daily with a meal.    Dispense:  60 tablet    Refill:  11  . ALPRAZolam (XANAX) 0.5 MG tablet    Sig: Take 1 tablet (0.5 mg total) by mouth 3 (three) times daily as needed for anxiety.    Dispense:  75 tablet    Refill:  2  . busPIRone (BUSPAR) 10 MG tablet    Sig: Take 1 tablet (10 mg total) by mouth 3 (three) times daily.    Dispense:  270 tablet    Refill:  3  . carvedilol (COREG) 3.125 MG tablet    Sig: Take 1 tablet (3.125 mg total) by mouth 2 (two) times daily with a meal.    Dispense:  180 tablet    Refill:  3  . citalopram (CELEXA) 40 MG tablet    Sig: Take 1 tablet (40 mg total) by mouth  daily.    Dispense:  90 tablet    Refill:  3  . clopidogrel (PLAVIX) 75 MG tablet    Sig: Take 1 tablet (75 mg total) by mouth daily.    Dispense:  30 tablet    Refill:  3  . cyclobenzaprine (FLEXERIL) 10 MG tablet    Sig: Take 1 tablet (10 mg total) by mouth 3 (three) times daily as needed for muscle spasms.    Dispense:  30 tablet    Refill:  [place], MD [facility] 01/21/2018, 5:02 PM

## 2018-01-21 NOTE — Patient Instructions (Signed)
Great too see you!  Come back to see [facility] or Dr. Lajuana Ripple in 2 months

## 2018-02-04 ENCOUNTER — Other Ambulatory Visit: Payer: Self-pay | Admitting: Cardiology

## 2018-02-04 MED ORDER — EZETIMIBE 10 MG PO TABS: 10.0000 mg | ORAL_TABLET | Freq: Every day | ORAL | 1 refills | Status: DC

## 2018-02-04 NOTE — Telephone Encounter (Signed)
Medication sent to pharmacy  

## 2018-02-04 NOTE — Telephone Encounter (Signed)
°  1. Which medications need to be refilled? (please list name of each medication and dose if known)    ezetimibe (ZETIA) 10 MG tablet    2. Which pharmacy/location (including street and city if local pharmacy) is medication to be sent to? [place] [facility]   3. Do they need a 30 day or 90 day supply?

## 2018-03-17 ENCOUNTER — Ambulatory Visit: Payer: PRIVATE HEALTH INSURANCE | Admitting: Thoracic Surgery (Cardiothoracic Vascular Surgery)

## 2018-03-17 ENCOUNTER — Encounter: Payer: Self-pay | Admitting: Thoracic Surgery (Cardiothoracic Vascular Surgery)

## 2018-03-17 VITALS — BP 108/76 | HR 80 | Resp 20 | Ht 60.0 in | Wt 163.0 lb

## 2018-03-17 DIAGNOSIS — I2111 ST elevation (STEMI) myocardial infarction involving right coronary artery: Secondary | ICD-10-CM

## 2018-03-17 DIAGNOSIS — Z955 Presence of coronary angioplasty implant and graft: Secondary | ICD-10-CM

## 2018-03-17 DIAGNOSIS — I2510 Atherosclerotic heart disease of native coronary artery without angina pectoris: Secondary | ICD-10-CM | POA: Diagnosis not present

## 2018-03-17 DIAGNOSIS — Z951 Presence of aortocoronary bypass graft: Secondary | ICD-10-CM

## 2018-03-17 NOTE — Progress Notes (Signed)
[address]       [place], [address]             [phone]     CARDIOTHORACIC SURGERY OFFICE NOTE  Referring Provider is Branch, Alphonse Guild, MD PCP is Timmothy Euler, MD   HPI:  Patient is a 47 year-old moderately obese female with history of hypertension, type 2 diabetes mellitus, hyperlipidemia, long-standing tobacco abuse, and a strong family history of coronary artery disease who presented with an acute inferior wall ST segment elevation myocardial infarction treated with PCI and stenting of the right coronary artery in early February of this year who subsequently underwent elective coronary artery bypass grafting x2 on December 12, 2017 for 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  Her early postoperative recovery was uneventful and she was discharged home on the fifth postoperative day.  She was last seen here in our office on January 20, 2018 at which time she was doing fairly well although she had already gone back to smoking cigarettes.  She returns for office today reports that she is slowly improving.  She still complains about pain all over her chest.  She states that her sternal scar feels numb and that she feels "bee stings" up and down her chest.  She denies any exertional shortness of breath or chest discomfort.  She smokes occasionally.  She claims that her blood sugars have been under fairly good control.  She states that she has been walking a fair amount and able to walk better than she had been previously.  She never heard from the cardiac rehab program about starting outpatient cardiac rehab.   Current Outpatient Medications  Medication Sig Dispense Refill  . acetaminophen (TYLENOL) 325 MG tablet Take 650 mg by mouth 2 (two) times daily as needed for moderate pain or headache.    . ALPRAZolam (XANAX) 0.5 MG tablet Take 1 tablet (0.5 mg total) by mouth 3 (three) times daily as needed for anxiety. 75 tablet 2  . aspirin 81 MG chewable  tablet Chew 1 tablet (81 mg total) by mouth daily.    Marland Kitchen atorvastatin (LIPITOR) 80 MG tablet TAKE 1 TABLET BY MOUTH ONCE DAILY AT  6PM 90 tablet 1  . busPIRone (BUSPAR) 10 MG tablet Take 1 tablet (10 mg total) by mouth 3 (three) times daily. 270 tablet 3  . carvedilol (COREG) 3.125 MG tablet Take 1 tablet (3.125 mg total) by mouth 2 (two) times daily with a meal. 180 tablet 3  . citalopram (CELEXA) 40 MG tablet Take 1 tablet (40 mg total) by mouth daily. 90 tablet 3  . clopidogrel (PLAVIX) 75 MG tablet Take 1 tablet (75 mg total) by mouth daily. 30 tablet 3  . cyclobenzaprine (FLEXERIL) 10 MG tablet Take 1 tablet (10 mg total) by mouth 3 (three) times daily as needed for muscle spasms. 30 tablet 1  . ezetimibe (ZETIA) 10 MG tablet Take 1 tablet (10 mg total) by mouth daily. 90 tablet 1  . ferrous sulfate 325 (65 FE) MG tablet Take 1 tablet (325 mg total) by mouth daily with breakfast. For one month then stop. (Patient not taking: Reported on 03/17/2018) 30 tablet 3  . gabapentin (NEURONTIN) 800 MG tablet Take 1 tablet (800 mg total) by mouth 4 (four) times daily. 120 tablet 5  . hydrOXYzine (ATARAX/VISTARIL) 50 MG tablet Take 1 tablet (50 mg total) by mouth every 8 (eight) hours as needed. (Patient taking differently: Take 50  mg by mouth every 8 (eight) hours as needed for anxiety or itching. ) 270 tablet 3  . insulin NPH Human (HUMULIN N,NOVOLIN N) 100 UNIT/ML injection Inject 0.4 mLs (40 Units total) 2 (two) times daily before a meal into the skin. (Patient taking differently: Inject 30-40 Units into the skin See admin instructions. 40 units in the morning and 30 units at night) 30 mL 3  . INSULIN SYRINGE .5CC/29G 29G X 1/2" 0.5 ML MISC 1 Syringe by Does not apply route 2 (two) times daily. 100 each 11  . Menthol, Topical Analgesic, (ICY HOT EX) Apply 1 application topically daily as needed (back pain).    . metFORMIN (GLUCOPHAGE) 1000 MG tablet Take 1 tablet (1,000 mg total) by mouth 2 (two) times  daily with a meal. 60 tablet 11  . nicotine (NICODERM CQ - DOSED IN MG/24 HOURS) 21 mg/24hr patch Place 1 patch (21 mg total) onto the skin daily. (Patient not taking: Reported on 03/17/2018) 28 patch 0  . omeprazole (PRILOSEC) 20 MG capsule Take 20 mg by mouth daily.    Marland Kitchen oxyCODONE (OXY IR/ROXICODONE) 5 MG immediate release tablet Take 1 tablet (5 mg total) by mouth every 4 (four) hours as needed for severe pain. (Patient not taking: Reported on 03/17/2018) 30 tablet 0   No current facility-administered medications for this visit.       Physical Exam:   BP 108/76   Pulse 80   Resp 20   Ht 5' (1.524 m)   Wt 163 lb (73.9 kg)   LMP 04/17/2014   SpO2 96% Comment: RA  BMI 31.83 kg/m   General:  Mildly obese  Chest:   Clear to auscultation  CV:   Regular rate and rhythm  Incisions:  Well-healed, sternum is stable  Abdomen:  Soft nontender  Extremities:  Warm and well-perfused  Diagnostic Tests:  n/a   Impression:  Patient is doing well nearly 3 months status post coronary artery bypass grafting  Plan:  I have strongly counseled patient regarding the adverse effects of tobacco abuse and encouraged her to find a way to quit smoking.  I have encouraged her to enroll and participate in the outpatient cardiac rehab program.  We have not recommended any changes to her current medications.  We have discussed the long-term benefits of regular exercise, heart healthy diet, and glycemic control in patients with diabetes.  All of her questions have been addressed.  She will continue to follow-up with Dr. Harl Bowie.  She will return to our office for routine follow-up next June, approximately 1 year following her surgery.  She will call and return sooner only should specific problems or questions arise.  I spent in excess of 10 minutes during the conduct of this office consultation and >50% of this time involved direct face-to-face encounter with the patient for counseling and/or coordination of  their care.    Valentina Gu. Roxy Manns, MD 03/17/2018 4:03 PM

## 2018-03-17 NOTE — Patient Instructions (Addendum)
Stop smoking immediately and permanently.  Continue all previous medications without any changes at this time  Make every effort to keep your diabetes under very tight control.  Follow up closely with your primary care physician or endocrinologist and strive to keep their hemoglobin A1c levels as low as possible, preferably near or below 6.0.  The long term benefits of strict control of diabetes are far reaching and critically important for your overall health and survival.  Make every effort to stay physically active, get some type of exercise on a regular basis, and stick to a "heart healthy diet".  The long term benefits for regular exercise and a healthy diet are critically important to your overall health and wellbeing.

## 2018-03-25 ENCOUNTER — Ambulatory Visit (INDEPENDENT_AMBULATORY_CARE_PROVIDER_SITE_OTHER): Payer: No Typology Code available for payment source | Admitting: Physician Assistant

## 2018-03-25 ENCOUNTER — Encounter: Payer: Self-pay | Admitting: Physician Assistant

## 2018-03-25 VITALS — BP 107/82 | HR 81 | Temp 98.3°F | Ht 60.0 in | Wt 164.0 lb

## 2018-03-25 DIAGNOSIS — M545 Low back pain: Secondary | ICD-10-CM | POA: Diagnosis not present

## 2018-03-25 DIAGNOSIS — G8929 Other chronic pain: Secondary | ICD-10-CM

## 2018-03-25 DIAGNOSIS — I10 Essential (primary) hypertension: Secondary | ICD-10-CM | POA: Diagnosis not present

## 2018-03-25 DIAGNOSIS — E108 Type 1 diabetes mellitus with unspecified complications: Secondary | ICD-10-CM

## 2018-03-25 LAB — BAYER DCA HB A1C WAIVED: HB A1C (BAYER DCA - WAIVED): 7.3 % — ABNORMAL HIGH (ref <7.0)

## 2018-03-25 MED ORDER — IBUPROFEN 800 MG PO TABS: 800.0000 mg | ORAL_TABLET | Freq: Three times a day (TID) | ORAL | 1 refills | Status: DC | PRN

## 2018-03-26 DIAGNOSIS — M545 Low back pain: Secondary | ICD-10-CM

## 2018-03-26 DIAGNOSIS — G8929 Other chronic pain: Secondary | ICD-10-CM | POA: Insufficient documentation

## 2018-03-26 LAB — CBC WITH DIFFERENTIAL/PLATELET
BASOS: 0 %
Basophils Absolute: 0 10*3/uL (ref 0.0–0.2)
EOS (ABSOLUTE): 0.1 10*3/uL (ref 0.0–0.4)
EOS: 1 %
HEMATOCRIT: 41.4 % (ref 34.0–46.6)
HEMOGLOBIN: 13.4 g/dL (ref 11.1–15.9)
Immature Grans (Abs): 0 10*3/uL (ref 0.0–0.1)
Immature Granulocytes: 0 %
Lymphocytes Absolute: 2.9 10*3/uL (ref 0.7–3.1)
Lymphs: 31 %
MCH: 27.5 pg (ref 26.6–33.0)
MCHC: 32.4 g/dL (ref 31.5–35.7)
MCV: 85 fL (ref 79–97)
Monocytes Absolute: 0.6 10*3/uL (ref 0.1–0.9)
Monocytes: 6 %
NEUTROS ABS: 5.8 10*3/uL (ref 1.4–7.0)
Neutrophils: 62 %
Platelets: 371 10*3/uL (ref 150–450)
RBC: 4.88 x10E6/uL (ref 3.77–5.28)
RDW: 16.8 % — ABNORMAL HIGH (ref 12.3–15.4)
WBC: 9.3 10*3/uL (ref 3.4–10.8)

## 2018-03-26 LAB — LIPID PANEL
CHOL/HDL RATIO: 6.8 ratio — AB (ref 0.0–4.4)
Cholesterol, Total: 225 mg/dL — ABNORMAL HIGH (ref 100–199)
HDL: 33 mg/dL — ABNORMAL LOW (ref >39)
LDL CALC: 145 mg/dL — AB (ref 0–99)
Triglycerides: 236 mg/dL — ABNORMAL HIGH (ref 0–149)
VLDL Cholesterol Cal: 47 mg/dL — ABNORMAL HIGH (ref 5–40)

## 2018-03-26 LAB — CMP14+EGFR
A/G RATIO: 1.4 (ref 1.2–2.2)
ALT: 11 IU/L (ref 0–32)
AST: 13 IU/L (ref 0–40)
Albumin: 4.5 g/dL (ref 3.5–5.5)
Alkaline Phosphatase: 105 IU/L (ref 39–117)
BUN/Creatinine Ratio: 14 (ref 9–23)
BUN: 8 mg/dL (ref 6–24)
Bilirubin Total: <0.2 mg/dL (ref 0.0–1.2)
CALCIUM: 9.8 mg/dL (ref 8.7–10.2)
CO2: 17 mmol/L — ABNORMAL LOW (ref 20–29)
CREATININE: 0.58 mg/dL (ref 0.57–1.00)
Chloride: 103 mmol/L (ref 96–106)
GFR, EST AFRICAN AMERICAN: 127 mL/min/{1.73_m2} (ref >59)
GFR, EST NON AFRICAN AMERICAN: 110 mL/min/{1.73_m2} (ref >59)
GLOBULIN, TOTAL: 3.2 g/dL (ref 1.5–4.5)
Glucose: 122 mg/dL — ABNORMAL HIGH (ref 65–99)
POTASSIUM: 4.7 mmol/L (ref 3.5–5.2)
Sodium: 141 mmol/L (ref 134–144)
TOTAL PROTEIN: 7.7 g/dL (ref 6.0–8.5)

## 2018-03-26 LAB — TSH: TSH: 0.788 u[IU]/mL (ref 0.450–4.500)

## 2018-03-26 NOTE — Progress Notes (Signed)
BP 107/82   Pulse 81   Temp 98.3 F (36.8 C) (Oral)   Ht 5' (1.524 m)   Wt 164 lb (74.4 kg)   LMP 04/17/2014   BMI 32.03 kg/m    Subjective:    Patient ID: Melissa Rubio, female    DOB: 09-28-1970, 47 y.o.   MRN: [id number]  HPI: Melissa Rubio is a 47 y.o. female presenting on 03/25/2018 for Diabetes (2 month follow up )  This patient comes in for periodic recheck on medications and conditions including diabetes uncontrolled, essential hypertension, chronic low back pain.  Patient has been through a bypass graft in June.  She had 2 MIs earlier in the ER.  She continues to get chest wall pain that involves burning and stinging.  She is currently on gabapentin.  She was taking it for another purpose but hopefully this will help calm down these nerves for them to heal.  Her cardiovascular thoracic surgeon did tell her that it could take up to a year for these things to calm down.  The patient is on insulin at this time for her diabetes.  She is using Humulin in 40 units in the morning 30 units at night.  She states she is not sure what her A1c will be but she knows that she still has some high readings.  She denies any hypoglycemic events.  All of her other medications are reviewed..   All medications are reviewed today. There are no reports of any problems with the medications. All of the medical conditions are reviewed and updated.  Lab work is reviewed and will be ordered as medically necessary. There are no new problems reported with today's visit.   Past Medical History:  Diagnosis Date  . Anxiety   . Cancer [facility]) 1994   cervical  . Coronary artery disease    2/19 PCI/DES x1 to dRCA with LM, LAD disease (planned for possible CABG in near future), normal EF  . Coronary artery disease involving native coronary artery of native heart with angina pectoris ([place])   . Diabetes ([place])   . Hypertension   . Itching   . Myocardial infarction ([place])   . RLS (restless legs syndrome)   . S/P CABG x  2 12/12/2017   LIMA to LAD SVG to OM  . Tobacco use    Relevant past medical, surgical, family and social history reviewed and updated as indicated. Interim medical history since our last visit reviewed. Allergies and medications reviewed and updated. DATA REVIEWED: CHART IN EPIC  Family History reviewed for pertinent findings.  Review of Systems  Constitutional: Positive for fatigue.  HENT: Negative.   Eyes: Negative.   Respiratory: Negative.   Cardiovascular: Positive for chest pain. Negative for palpitations.  Gastrointestinal: Negative.   Genitourinary: Negative.   Musculoskeletal: Positive for arthralgias, back pain and myalgias.  Skin: Negative.     Allergies as of 03/25/2018   No Known Allergies     Medication List        Accurate as of 03/25/18 11:59 PM. Always use your most recent med list.          acetaminophen 325 MG tablet Commonly known as:  TYLENOL Take 650 mg by mouth 2 (two) times daily as needed for moderate pain or headache.   ALPRAZolam 0.5 MG tablet Commonly known as:  XANAX Take 1 tablet (0.5 mg total) by mouth 3 (three) times daily as needed for anxiety.   aspirin 81 MG chewable tablet Chew  1 tablet (81 mg total) by mouth daily.   atorvastatin 80 MG tablet Commonly known as:  LIPITOR TAKE 1 TABLET BY MOUTH ONCE DAILY AT  6PM   busPIRone 10 MG tablet Commonly known as:  BUSPAR Take 1 tablet (10 mg total) by mouth 3 (three) times daily.   carvedilol 3.125 MG tablet Commonly known as:  COREG Take 1 tablet (3.125 mg total) by mouth 2 (two) times daily with a meal.   citalopram 40 MG tablet Commonly known as:  CELEXA Take 1 tablet (40 mg total) by mouth daily.   clopidogrel 75 MG tablet Commonly known as:  PLAVIX Take 1 tablet (75 mg total) by mouth daily.   cyclobenzaprine 10 MG tablet Commonly known as:  FLEXERIL Take 1 tablet (10 mg total) by mouth 3 (three) times daily as needed for muscle spasms.   ezetimibe 10 MG  tablet Commonly known as:  ZETIA Take 1 tablet (10 mg total) by mouth daily.   ferrous sulfate 325 (65 FE) MG tablet Take 1 tablet (325 mg total) by mouth daily with breakfast. For one month then stop.   gabapentin 800 MG tablet Commonly known as:  NEURONTIN Take 1 tablet (800 mg total) by mouth 4 (four) times daily.   hydrOXYzine 50 MG tablet Commonly known as:  ATARAX/VISTARIL Take 1 tablet (50 mg total) by mouth every 8 (eight) hours as needed.   ibuprofen 800 MG tablet Commonly known as:  ADVIL,MOTRIN Take 1 tablet (800 mg total) by mouth every 8 (eight) hours as needed.   ICY HOT EX Apply 1 application topically daily as needed (back pain).   insulin NPH Human 100 UNIT/ML injection Commonly known as:  HUMULIN N,NOVOLIN N Inject 0.4 mLs (40 Units total) 2 (two) times daily before a meal into the skin.   INSULIN SYRINGE .5CC/29G 29G X 1/2" 0.5 ML Misc 1 Syringe by Does not apply route 2 (two) times daily.   metFORMIN 1000 MG tablet Commonly known as:  GLUCOPHAGE Take 1 tablet (1,000 mg total) by mouth 2 (two) times daily with a meal.   nicotine 21 mg/24hr patch Commonly known as:  NICODERM CQ - dosed in mg/24 hours Place 1 patch (21 mg total) onto the skin daily.   omeprazole 20 MG capsule Commonly known as:  PRILOSEC Take 20 mg by mouth daily.   oxyCODONE 5 MG immediate release tablet Commonly known as:  Oxy IR/ROXICODONE Take 1 tablet (5 mg total) by mouth every 4 (four) hours as needed for severe pain.          Objective:    BP 107/82   Pulse 81   Temp 98.3 F (36.8 C) (Oral)   Ht 5' (1.524 m)   Wt 164 lb (74.4 kg)   LMP 04/17/2014   BMI 32.03 kg/m   No Known Allergies  Wt Readings from Last 3 Encounters:  03/25/18 164 lb (74.4 kg)  03/17/18 163 lb (73.9 kg)  01/21/18 157 lb 12.8 oz (71.6 kg)    Physical Exam  Constitutional: She is oriented to person, place, and time. She appears well-developed and well-nourished.  HENT:  Head:  Normocephalic and atraumatic.  Eyes: Pupils are equal, round, and reactive to light. Conjunctivae and EOM are normal.  Cardiovascular: Normal rate, regular rhythm, normal heart sounds and intact distal pulses.  Pulmonary/Chest: Effort normal and breath sounds normal.  Abdominal: Soft. Bowel sounds are normal.  Neurological: She is alert and oriented to person, place, and time. She has normal  reflexes.  Skin: Skin is warm and dry. No rash noted.  Psychiatric: She has a normal mood and affect. Her behavior is normal. Judgment and thought content normal.    Results for orders placed or performed in visit on 03/25/18  CMP14+EGFR  Result Value Ref Range   Glucose 122 (H) 65 - 99 mg/dL   BUN 8 6 - 24 mg/dL   Creatinine, Ser 0.58 0.57 - 1.00 mg/dL   GFR calc non Af Amer 110 >59 mL/min/1.73   GFR calc Af Amer 127 >59 mL/min/1.73   BUN/Creatinine Ratio 14 9 - 23   Sodium 141 134 - 144 mmol/L   Potassium 4.7 3.5 - 5.2 mmol/L   Chloride 103 96 - 106 mmol/L   CO2 17 (L) 20 - 29 mmol/L   Calcium 9.8 8.7 - 10.2 mg/dL   Total Protein 7.7 6.0 - 8.5 g/dL   Albumin 4.5 3.5 - 5.5 g/dL   Globulin, Total 3.2 1.5 - 4.5 g/dL   Albumin/Globulin Ratio 1.4 1.2 - 2.2   Bilirubin Total <0.2 0.0 - 1.2 mg/dL   Alkaline Phosphatase 105 39 - 117 IU/L   AST 13 0 - 40 IU/L   ALT 11 0 - 32 IU/L  CBC with Differential/Platelet  Result Value Ref Range   WBC 9.3 3.4 - 10.8 x10E3/uL   RBC 4.88 3.77 - 5.28 x10E6/uL   Hemoglobin 13.4 11.1 - 15.9 g/dL   Hematocrit 41.4 34.0 - 46.6 %   MCV 85 79 - 97 fL   MCH 27.5 26.6 - 33.0 pg   MCHC 32.4 31.5 - 35.7 g/dL   RDW 16.8 (H) 12.3 - 15.4 %   Platelets 371 150 - 450 x10E3/uL   Neutrophils 62 Not Estab. %   Lymphs 31 Not Estab. %   Monocytes 6 Not Estab. %   Eos 1 Not Estab. %   Basos 0 Not Estab. %   Neutrophils Absolute 5.8 1.4 - 7.0 x10E3/uL   Lymphocytes Absolute 2.9 0.7 - 3.1 x10E3/uL   Monocytes Absolute 0.6 0.1 - 0.9 x10E3/uL   EOS (ABSOLUTE) 0.1 0.0 - 0.4  x10E3/uL   Basophils Absolute 0.0 0.0 - 0.2 x10E3/uL   Immature Granulocytes 0 Not Estab. %   Immature Grans (Abs) 0.0 0.0 - 0.1 x10E3/uL  Lipid panel  Result Value Ref Range   Cholesterol, Total 225 (H) 100 - 199 mg/dL   Triglycerides 236 (H) 0 - 149 mg/dL   HDL 33 (L) >39 mg/dL   VLDL Cholesterol Cal 47 (H) 5 - 40 mg/dL   LDL Calculated 145 (H) 0 - 99 mg/dL   Chol/HDL Ratio 6.8 (H) 0.0 - 4.4 ratio  TSH  Result Value Ref Range   TSH 0.788 0.450 - 4.500 uIU/mL  Bayer DCA Hb A1c Waived  Result Value Ref Range   HB A1C (BAYER DCA - WAIVED) 7.3 (H) <7.0 %      Assessment & Plan:   1. Type 1 diabetes mellitus with complication (HCC) - CMP14+EGFR - CBC with Differential/Platelet - Lipid panel - TSH - Bayer DCA Hb A1c Waived  2. Essential hypertension - CMP14+EGFR - CBC with Differential/Platelet - Lipid panel - TSH - Bayer DCA Hb A1c Waived  3. Chronic right-sided low back pain without sciatica - ibuprofen (ADVIL,MOTRIN) 800 MG tablet; Take 1 tablet (800 mg total) by mouth every 8 (eight) hours as needed.  Dispense: 90 tablet; Refill: 1    Continue all other maintenance medications as listed above.  Follow up   plan: Return in about 4 weeks (around 04/22/2018) for recheck.  Educational handout given for Turner PA-C [place] [address]  [place], [place] [address] [phone]   03/26/2018, 11:53 AM

## 2018-04-02 ENCOUNTER — Ambulatory Visit (INDEPENDENT_AMBULATORY_CARE_PROVIDER_SITE_OTHER): Payer: PRIVATE HEALTH INSURANCE | Admitting: Cardiology

## 2018-04-02 ENCOUNTER — Other Ambulatory Visit: Payer: Self-pay | Admitting: *Deleted

## 2018-04-02 VITALS — BP 118/80 | HR 83 | Ht 60.0 in | Wt 164.6 lb

## 2018-04-02 DIAGNOSIS — Z79899 Other long term (current) drug therapy: Secondary | ICD-10-CM

## 2018-04-02 DIAGNOSIS — I10 Essential (primary) hypertension: Secondary | ICD-10-CM

## 2018-04-02 DIAGNOSIS — I2510 Atherosclerotic heart disease of native coronary artery without angina pectoris: Secondary | ICD-10-CM

## 2018-04-02 DIAGNOSIS — E782 Mixed hyperlipidemia: Secondary | ICD-10-CM

## 2018-04-02 MED ORDER — LISINOPRIL 2.5 MG PO TABS: 2.5000 mg | ORAL_TABLET | Freq: Every day | ORAL | 3 refills | Status: DC

## 2018-04-02 MED ORDER — ROSUVASTATIN CALCIUM 40 MG PO TABS: 40.0000 mg | ORAL_TABLET | Freq: Every day | ORAL | 3 refills | Status: DC

## 2018-04-02 NOTE — Progress Notes (Signed)
Clinical Summary Ms. Niesen is a 47 y.o.female seen today for follow up of the following medical problems.   1. 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 Full report as indicated below, received DES to RCA. LM 50%, LAD 65%, LCX 80% treated medically.  - 08/2017 echo LVEF 55-60%, inferior hypokinesis - medical therapy limited due to soft bp's during admission.  - referred to CT surgery given remaining LM/LAD and LCX disease - s/p CABG 12/12/17 with LIMA-LAD, SVG OM - changed from brillinta to plavix due to cost - 11/2017 echo LVEF 55-60%, no WMAs   - since discharge some pain at surgical site. Otherwise doing well - compliant with meds.   - some recent chest pain,ongoing soreness and numbess with position at surgical site. Some SOB with her increaseing activity levels - not interested in cardiac rehab - compliant with meds.    2. PAD - prior to CABG ABI right 0.62, left 1.12 - mainly limited by hip and back pain. Occasional cramping pain right calf/thigh, not limiting.   3. Hyperlipidemia - recent lipid panel showed 03/2018 TC 225 TG 236 HDL 33 LDL 145 - taking atorva 80mg  and zetia 10mg  daily.   Past Medical History:  Diagnosis Date  . Anxiety   . Cancer [facility]) 1994   cervical  . Coronary artery disease    2/19 PCI/DES x1 to dRCA with LM, LAD disease (planned for possible CABG in near future), normal EF  . Coronary artery disease involving native coronary artery of native heart with angina pectoris ([place])   . Diabetes ([place])   . Hypertension   . Itching   . Myocardial infarction ([place])   . RLS (restless legs syndrome)   . 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 Tobacco use      No Known Allergies   Current Outpatient Medications  Medication Sig Dispense Refill  . acetaminophen (TYLENOL) 325 MG tablet Take 650 mg by mouth 2 (two) times daily as needed for moderate pain or headache.    . ALPRAZolam (XANAX) 0.5 MG tablet Take 1 tablet (0.5 mg total) by mouth 3  (three) times daily as needed for anxiety. 75 tablet 2  . aspirin 81 MG chewable tablet Chew 1 tablet (81 mg total) by mouth daily.    Marland Kitchen atorvastatin (LIPITOR) 80 MG tablet TAKE 1 TABLET BY MOUTH ONCE DAILY AT  6PM 90 tablet 1  . busPIRone (BUSPAR) 10 MG tablet Take 1 tablet (10 mg total) by mouth 3 (three) times daily. 270 tablet 3  . carvedilol (COREG) 3.125 MG tablet Take 1 tablet (3.125 mg total) by mouth 2 (two) times daily with a meal. 180 tablet 3  . citalopram (CELEXA) 40 MG tablet Take 1 tablet (40 mg total) by mouth daily. 90 tablet 3  . clopidogrel (PLAVIX) 75 MG tablet Take 1 tablet (75 mg total) by mouth daily. 30 tablet 3  . cyclobenzaprine (FLEXERIL) 10 MG tablet Take 1 tablet (10 mg total) by mouth 3 (three) times daily as needed for muscle spasms. 30 tablet 1  . ezetimibe (ZETIA) 10 MG tablet Take 1 tablet (10 mg total) by mouth daily. 90 tablet 1  . ferrous sulfate 325 (65 FE) MG tablet Take 1 tablet (325 mg total) by mouth daily with breakfast. For one month then stop. 30 tablet 3  . gabapentin (NEURONTIN) 800 MG tablet Take 1 tablet (800 mg total) by mouth 4 (four) times daily. 120 tablet  5  . hydrOXYzine (ATARAX/VISTARIL) 50 MG tablet Take 1 tablet (50 mg total) by mouth every 8 (eight) hours as needed. (Patient taking differently: Take 50 mg by mouth every 8 (eight) hours as needed for anxiety or itching. ) 270 tablet 3  . ibuprofen (ADVIL,MOTRIN) 800 MG tablet Take 1 tablet (800 mg total) by mouth every 8 (eight) hours as needed. 90 tablet 1  . insulin NPH Human (HUMULIN N,NOVOLIN N) 100 UNIT/ML injection Inject 0.4 mLs (40 Units total) 2 (two) times daily before a meal into the skin. (Patient taking differently: Inject 30-40 Units into the skin See admin instructions. 40 units in the morning and 30 units at night) 30 mL 3  . INSULIN SYRINGE .5CC/29G 29G X 1/2" 0.5 ML MISC 1 Syringe by Does not apply route 2 (two) times daily. 100 each 11  . Menthol, Topical Analgesic, (ICY HOT  EX) Apply 1 application topically daily as needed (back pain).    . metFORMIN (GLUCOPHAGE) 1000 MG tablet Take 1 tablet (1,000 mg total) by mouth 2 (two) times daily with a meal. 60 tablet 11  . nicotine (NICODERM CQ - DOSED IN MG/24 HOURS) 21 mg/24hr patch Place 1 patch (21 mg total) onto the skin daily. 28 patch 0  . omeprazole (PRILOSEC) 20 MG capsule Take 20 mg by mouth daily.    Marland Kitchen oxyCODONE (OXY IR/ROXICODONE) 5 MG immediate release tablet Take 1 tablet (5 mg total) by mouth every 4 (four) hours as needed for severe pain. 30 tablet 0   No current facility-administered medications for this visit.      Past Surgical History:  Procedure Laterality Date  . CARDIAC CATHETERIZATION    . CORONARY ARTERY BYPASS GRAFT N/A 12/12/2017   Procedure: MEDIAN STERNOTOMY for CORONARY ARTERY BYPASS GRAFTING (CABG) x 2 (LIMA to LAD, SVG to OM) using RIGHT THIGH GREATER SAPHENOUS VEIN and LEFT INTERNAL MAMMARY ARTERY;  Surgeon: Rexene Alberts, MD;  Location: [place];  Service: Open Heart Surgery;  Laterality: N/A;  . CORONARY STENT INTERVENTION N/A 09/01/2017   Procedure: CORONARY STENT INTERVENTION;  Surgeon: Martinique, Peter M, MD;  Location: [place] [facility];  Service: Cardiovascular;  Laterality: N/A;  . CORONARY/GRAFT ACUTE MI REVASCULARIZATION N/A 09/01/2017   Procedure: Coronary/Graft Acute MI Revascularization;  Surgeon: Martinique, Peter M, MD;  Location: [place] [facility];  Service: Cardiovascular;  Laterality: N/A;  . LEFT HEART CATH AND CORONARY ANGIOGRAPHY N/A 09/01/2017   Procedure: LEFT HEART CATH AND CORONARY ANGIOGRAPHY;  Surgeon: Martinique, Peter M, MD;  Location: [place] [facility];  Service: Cardiovascular;  Laterality: N/A;  . PARTIAL HYSTERECTOMY  2009  . TEE WITHOUT CARDIOVERSION N/A 12/12/2017   Procedure: TRANSESOPHAGEAL ECHOCARDIOGRAM (TEE);  Surgeon: Rexene Alberts, MD;  Location: [place];  Service: Open Heart Surgery;  Laterality: N/A;     No Known Allergies    Family History  Problem  Relation Age of Onset  . Cancer Mother   . Heart attack Father   . Diabetes Sister   . Diabetes Brother   . Diabetes Sister   . Heart attack Sister      Social History Ms. Nevils reports that she has been smoking cigarettes. She started smoking about 36 years ago. She has a 26.25 pack-year smoking history. She has never used smokeless tobacco. Ms. Vandenbrink reports that she does not drink alcohol.   Review of Systems CONSTITUTIONAL: No weight loss, fever, chills, weakness or fatigue.  HEENT: Eyes: No visual loss, blurred vision, double  vision or yellow sclerae.No hearing loss, sneezing, congestion, runny nose or sore throat.  SKIN: No rash or itching.  CARDIOVASCULAR: per hpi RESPIRATORY: No shortness of breath, cough or sputum.  GASTROINTESTINAL: No anorexia, nausea, vomiting or diarrhea. No abdominal pain or blood.  GENITOURINARY: No burning on urination, no polyuria NEUROLOGICAL: No headache, dizziness, syncope, paralysis, ataxia, numbness or tingling in the extremities. No change in bowel or bladder control.  MUSCULOSKELETAL: No muscle, back pain, joint pain or stiffness.  LYMPHATICS: No enlarged nodes. No history of splenectomy.  PSYCHIATRIC: No history of depression or anxiety.  ENDOCRINOLOGIC: No reports of sweating, cold or heat intolerance. No polyuria or polydipsia.  Marland Kitchen   Physical Examination Vitals:   04/02/18 1459  BP: 118/80  Pulse: 83  SpO2: 97%   Vitals:   04/02/18 1459  Weight: 164 lb 9.6 oz (74.7 kg)  Height: 5' (1.524 m)    Gen: resting comfortably, no acute distress HEENT: no scleral icterus, pupils equal round and reactive, no palptable cervical adenopathy,  CV: RRR, no mr/g, no jvd Resp: Clear to auscultation bilaterally GI: abdomen is soft, non-tender, non-distended, normal bowel sounds, no hepatosplenomegaly MSK: extremities are warm, no edema.  Skin: warm, no rash Neuro:  no focal deficits Psych: appropriate affect   Diagnostic Studies 08/2017  cath  Dist RCA lesion is 100% stenosed.  Prox RCA to Mid RCA lesion is 25% stenosed.  Dist LM-2 lesion is 50% stenosed.  Dist LM to Ost LAD lesion is 65% stenosed.  Ost Cx to Prox Cx lesion is 80% stenosed.  Post intervention, there is a 0% residual stenosis.  A drug-eluting stent was successfully placed using a STENT SYNERGY DES 2.5X32.  The left ventricular systolic function is normal.  LV end diastolic pressure is mildly elevated.  The left ventricular ejection fraction is 55-65% by visual estimate.  1. 3 vessel obstructive CAD. 100% distal RCA is the culprit vessel. She also has 50% distal left main, 65% ostial LAD, and 80% ostial LCx 2. Inferior wall motion abnormality with preserved LV systolic function 3. Mildly elevated LVEDP 4. Successful stenting of the distal RCA with DES  Plan: DAPT for one year. Will need to review films with interventional colleagues to decide management of residual disease in LCA. For now will treat medically. It is not well suited for PCI. Will need to decide whether CABG is needed after she has healed from MI.  Carotid/ABI preCABG [place] Final Interpretation: Right Carotid: Velocities in the right ICA are consistent with a 1-39% stenosis.  Left Carotid: Velocities in the left ICA are consistent with a 1-39% stenosis. Vertebrals: Bilateral vertebral arteries demonstrate antegrade flow.  Right ABI: Resting right ankle-brachial index indicates moderate right lower extremity arterial disease.  Left ABI: Resting left ankle-brachial index is within normal range. No evidence of significant left lower extremity arterial disease.   Right Upper Extremity: Radial Doppler waveforms decrease >50% with compression. Ulnar Doppler waveforms remain within normal limits with compression.  Left Upper Extremity: Radial and Ulnar Doppler waveforms remain within normal limits with compression.      Assessment and Plan  1. CAD - recent inferior STEMI,  s/p stenting. She had residual LM 50% and ostial LAD 65%, LCX 80%. She has recently had a CABG for this remaining disease - continue medical therapy. Start lisinopril 2.5mg  daily in setting of CAD, prior ACS, DM2, Had not been on previously due to soft bp's but I think can tolerate at this time. Recheck BMET in 2  weeks.   2. PAD - noted by preCABG ABIs. Mild nonlimiting symptoms - continue medical therapy  3. Hyperlipidemia - not at goal on atorvastatin 80 and ezetimibe 10. Change atorva to crestor 40mg  daily - if not controlled on crestor and zetia, will need consideration for pcsk9 inhibitor  F/u 08/2018    Arnoldo Lenis, M.D.

## 2018-04-02 NOTE — Patient Instructions (Addendum)
Medication Instructions:   Your physician has recommended you make the following change in your medication:   Stop atorvastatin  Start crestor 40 mg by mouth daily.  Start lisinopril 2.5 mg by mouth daily.  Continue all other medications the same.  Labwork:  Your physician recommends that you return for lab work in: 2 weeks to check your BMET. Lab order given during visit.  Testing/Procedures:  NONE  Follow-Up:  Your physician recommends that you schedule a follow-up appointment in: February 2020. You will receive a reminder letter in the mail in about 2 months reminding you to call and schedule your appointment. If you don't receive this letter, please contact our office.  Any Other Special Instructions Will Be Listed Below (If Applicable).  If you need a refill on your cardiac medications before your next appointment, please call your pharmacy.

## 2018-04-03 ENCOUNTER — Telehealth: Payer: Self-pay | Admitting: Cardiology

## 2018-04-03 NOTE — Telephone Encounter (Signed)
Disregard message sent to provider

## 2018-04-03 NOTE — Telephone Encounter (Signed)
Patient can not afford Crestor medication.  [facility] is asking $300

## 2018-04-03 NOTE — Telephone Encounter (Signed)
Patient called back to say that she has found another pharmacy to fill her prescription. She states that she will get the Crestor filled.

## 2018-04-03 NOTE — Telephone Encounter (Signed)
Anything other medication we can try for her since insurance won't cover this?

## 2018-04-18 ENCOUNTER — Telehealth: Payer: Self-pay | Admitting: *Deleted

## 2018-04-18 NOTE — Telephone Encounter (Signed)
-----   Message from Arnoldo Lenis, MD sent at 04/18/2018 12:26 PM EDT ----- Labs look good  Zandra Abts MD

## 2018-04-18 NOTE — Telephone Encounter (Signed)
Left normal results on VM per DPR - routed to pcp

## 2018-04-25 ENCOUNTER — Ambulatory Visit (INDEPENDENT_AMBULATORY_CARE_PROVIDER_SITE_OTHER): Payer: No Typology Code available for payment source | Admitting: Physician Assistant

## 2018-04-25 ENCOUNTER — Encounter: Payer: Self-pay | Admitting: Physician Assistant

## 2018-04-25 VITALS — BP 111/81 | HR 85 | Temp 97.4°F | Ht 60.0 in | Wt 163.4 lb

## 2018-04-25 DIAGNOSIS — F5101 Primary insomnia: Secondary | ICD-10-CM

## 2018-04-25 DIAGNOSIS — F320 Major depressive disorder, single episode, mild: Secondary | ICD-10-CM

## 2018-04-25 DIAGNOSIS — Z951 Presence of aortocoronary bypass graft: Secondary | ICD-10-CM

## 2018-04-25 DIAGNOSIS — E1065 Type 1 diabetes mellitus with hyperglycemia: Secondary | ICD-10-CM

## 2018-04-25 DIAGNOSIS — I25119 Atherosclerotic heart disease of native coronary artery with unspecified angina pectoris: Secondary | ICD-10-CM | POA: Diagnosis not present

## 2018-04-25 DIAGNOSIS — F419 Anxiety disorder, unspecified: Secondary | ICD-10-CM

## 2018-04-25 MED ORDER — BUSPIRONE HCL 15 MG PO TABS: 15.0000 mg | ORAL_TABLET | Freq: Three times a day (TID) | ORAL | 11 refills | Status: DC

## 2018-04-25 MED ORDER — OXYCODONE HCL 10 MG PO TABS: 10.0000 mg | ORAL_TABLET | ORAL | 0 refills | Status: DC | PRN

## 2018-04-25 MED ORDER — ALPRAZOLAM 1 MG PO TABS: 0.5000 mg | ORAL_TABLET | Freq: Three times a day (TID) | ORAL | 5 refills | Status: DC | PRN

## 2018-04-25 MED ORDER — TOPIRAMATE 25 MG PO TABS: 25.0000 mg | ORAL_TABLET | Freq: Two times a day (BID) | ORAL | 1 refills | Status: DC

## 2018-04-27 NOTE — Progress Notes (Signed)
BP 111/81   Pulse 85   Temp (!) 97.4 F (36.3 C) (Oral)   Ht 5' (1.524 m)   Wt 163 lb 6.4 oz (74.1 kg)   LMP 04/17/2014   BMI 31.91 kg/m    Subjective:    Patient ID: Melissa Rubio, female    DOB: 08/03/1970, 47 y.o.   MRN: [id number]  HPI: Melissa Rubio is a 47 y.o. female presenting on 04/25/2018 for Diabetes and Hypertension This patient comes in for periodic recheck on medications and conditions including CAD, type 1 diabetes, history of CABG, insomnia, anxiety, depression, weight gain. She is brought up to date on her medcation and narcotic contract. Continues with pain and neuropathy, also fatigue and weight gain.   All medications are reviewed today. There are no reports of any problems with the medications. All of the medical conditions are reviewed and updated.  Lab work is reviewed and will be ordered as medically necessary. There are no new problems reported with today's visit.   Past Medical History:  Diagnosis Date  . Anxiety   . Cancer [facility]) 1994   cervical  . Coronary artery disease    2/19 PCI/DES x1 to dRCA with LM, LAD disease (planned for possible CABG in near future), normal EF  . Coronary artery disease involving native coronary artery of native heart with angina pectoris ([place])   . Diabetes ([place])   . Hypertension   . Itching   . Myocardial infarction ([place])   . RLS (restless legs syndrome)   . 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 Tobacco use    Relevant past medical, surgical, family and social history reviewed and updated as indicated. Interim medical history since our last visit reviewed. Allergies and medications reviewed and updated. DATA REVIEWED: CHART IN EPIC  Family History reviewed for pertinent findings.  Review of Systems  Constitutional: Positive for unexpected weight change.  HENT: Negative.   Eyes: Negative.   Respiratory: Negative.   Cardiovascular: Negative.   Gastrointestinal: Negative.   Genitourinary: Negative.     Neurological: Positive for headaches.  Psychiatric/Behavioral: Positive for dysphoric mood and sleep disturbance. Negative for suicidal ideas. The patient is nervous/anxious.     Allergies as of 04/25/2018   No Known Allergies     Medication List        Accurate as of 04/25/18 11:59 PM. Always use your most recent med list.          acetaminophen 325 MG tablet Commonly known as:  TYLENOL Take 650 mg by mouth 2 (two) times daily as needed for moderate pain or headache.   ALPRAZolam 1 MG tablet Commonly known as:  XANAX Take 0.5 tablets (0.5 mg total) by mouth 3 (three) times daily as needed for anxiety.   aspirin 81 MG chewable tablet Chew 1 tablet (81 mg total) by mouth daily.   busPIRone 15 MG tablet Commonly known as:  BUSPAR Take 1 tablet (15 mg total) by mouth 3 (three) times daily.   carvedilol 3.125 MG tablet Commonly known as:  COREG Take 1 tablet (3.125 mg total) by mouth 2 (two) times daily with a meal.   citalopram 40 MG tablet Commonly known as:  CELEXA Take 1 tablet (40 mg total) by mouth daily.   clopidogrel 75 MG tablet Commonly known as:  PLAVIX Take 1 tablet (75 mg total) by mouth daily.   cyclobenzaprine 10 MG tablet Commonly known as:  FLEXERIL Take 1 tablet (  10 mg total) by mouth 3 (three) times daily as needed for muscle spasms.   ezetimibe 10 MG tablet Commonly known as:  ZETIA Take 1 tablet (10 mg total) by mouth daily.   ferrous sulfate 325 (65 FE) MG tablet Take 1 tablet (325 mg total) by mouth daily with breakfast. For one month then stop.   gabapentin 800 MG tablet Commonly known as:  NEURONTIN Take 1 tablet (800 mg total) by mouth 4 (four) times daily.   hydrOXYzine 50 MG tablet Commonly known as:  ATARAX/VISTARIL Take 1 tablet (50 mg total) by mouth every 8 (eight) hours as needed.   ibuprofen 800 MG tablet Commonly known as:  ADVIL,MOTRIN Take 1 tablet (800 mg total) by mouth every 8 (eight) hours as needed.   ICY HOT  EX Apply 1 application topically daily as needed (back pain).   insulin NPH Human 100 UNIT/ML injection Commonly known as:  HUMULIN N,NOVOLIN N Inject 0.4 mLs (40 Units total) 2 (two) times daily before a meal into the skin.   INSULIN SYRINGE .5CC/29G 29G X 1/2" 0.5 ML Misc 1 Syringe by Does not apply route 2 (two) times daily.   lisinopril 2.5 MG tablet Commonly known as:  PRINIVIL,ZESTRIL Take 1 tablet (2.5 mg total) by mouth daily.   metFORMIN 1000 MG tablet Commonly known as:  GLUCOPHAGE Take 1 tablet (1,000 mg total) by mouth 2 (two) times daily with a meal.   nicotine 21 mg/24hr patch Commonly known as:  NICODERM CQ - dosed in mg/24 hours Place 1 patch (21 mg total) onto the skin daily.   omeprazole 20 MG capsule Commonly known as:  PRILOSEC Take 20 mg by mouth daily.   Oxycodone HCl 10 MG Tabs Take 1 tablet (10 mg total) by mouth every 4 (four) hours as needed for severe pain.   rosuvastatin 40 MG tablet Commonly known as:  CRESTOR Take 1 tablet (40 mg total) by mouth daily.   topiramate 25 MG tablet Commonly known as:  TOPAMAX Take 1-4 tablets (25-100 mg total) by mouth 2 (two) times daily.          Objective:    BP 111/81   Pulse 85   Temp (!) 97.4 F (36.3 C) (Oral)   Ht 5' (1.524 m)   Wt 163 lb 6.4 oz (74.1 kg)   LMP 04/17/2014   BMI 31.91 kg/m   No Known Allergies  Wt Readings from Last 3 Encounters:  04/25/18 163 lb 6.4 oz (74.1 kg)  04/02/18 164 lb 9.6 oz (74.7 kg)  03/25/18 164 lb (74.4 kg)    Physical Exam  Constitutional: She is oriented to person, place, and time. She appears well-developed and well-nourished.  HENT:  Head: Normocephalic and atraumatic.  Eyes: Pupils are equal, round, and reactive to light. Conjunctivae and EOM are normal.  Cardiovascular: Normal rate, regular rhythm, normal heart sounds and intact distal pulses.  Pulmonary/Chest: Effort normal and breath sounds normal.  Abdominal: Soft. Bowel sounds are normal.   Neurological: She is alert and oriented to person, place, and time. She has normal reflexes.  Skin: Skin is warm and dry. No rash noted.  Psychiatric: She has a normal mood and affect. Her behavior is normal. Judgment and thought content normal.    Results for orders placed or performed in visit on 03/25/18  CMP14+EGFR  Result Value Ref Range   Glucose 122 (H) 65 - 99 mg/dL   BUN 8 6 - 24 mg/dL   Creatinine, Ser 0.58  0.57 - 1.00 mg/dL   GFR calc non Af Amer 110 >59 mL/min/1.73   GFR calc Af Amer 127 >59 mL/min/1.73   BUN/Creatinine Ratio 14 9 - 23   Sodium 141 134 - 144 mmol/L   Potassium 4.7 3.5 - 5.2 mmol/L   Chloride 103 96 - 106 mmol/L   CO2 17 (L) 20 - 29 mmol/L   Calcium 9.8 8.7 - 10.2 mg/dL   Total Protein 7.7 6.0 - 8.5 g/dL   Albumin 4.5 3.5 - 5.5 g/dL   Globulin, Total 3.2 1.5 - 4.5 g/dL   Albumin/Globulin Ratio 1.4 1.2 - 2.2   Bilirubin Total <0.2 0.0 - 1.2 mg/dL   Alkaline Phosphatase 105 39 - 117 IU/L   AST 13 0 - 40 IU/L   ALT 11 0 - 32 IU/L  CBC with Differential/Platelet  Result Value Ref Range   WBC 9.3 3.4 - 10.8 x10E3/uL   RBC 4.88 3.77 - 5.28 x10E6/uL   Hemoglobin 13.4 11.1 - 15.9 g/dL   Hematocrit 41.4 34.0 - 46.6 %   MCV 85 79 - 97 fL   MCH 27.5 26.6 - 33.0 pg   MCHC 32.4 31.5 - 35.7 g/dL   RDW 16.8 (H) 12.3 - 15.4 %   Platelets 371 150 - 450 x10E3/uL   Neutrophils 62 Not Estab. %   Lymphs 31 Not Estab. %   Monocytes 6 Not Estab. %   Eos 1 Not Estab. %   Basos 0 Not Estab. %   Neutrophils Absolute 5.8 1.4 - 7.0 x10E3/uL   Lymphocytes Absolute 2.9 0.7 - 3.1 x10E3/uL   Monocytes Absolute 0.6 0.1 - 0.9 x10E3/uL   EOS (ABSOLUTE) 0.1 0.0 - 0.4 x10E3/uL   Basophils Absolute 0.0 0.0 - 0.2 x10E3/uL   Immature Granulocytes 0 Not Estab. %   Immature Grans (Abs) 0.0 0.0 - 0.1 x10E3/uL  Lipid panel  Result Value Ref Range   Cholesterol, Total 225 (H) 100 - 199 mg/dL   Triglycerides 236 (H) 0 - 149 mg/dL   HDL 33 (L) >39 mg/dL   VLDL Cholesterol Cal 47  (H) 5 - 40 mg/dL   LDL Calculated 145 (H) 0 - 99 mg/dL   Chol/HDL Ratio 6.8 (H) 0.0 - 4.4 ratio  TSH  Result Value Ref Range   TSH 0.788 0.450 - 4.500 uIU/mL  Bayer DCA Hb A1c Waived  Result Value Ref Range   HB A1C (BAYER DCA - WAIVED) 7.3 (H) <7.0 %      Assessment & Plan:   1. Coronary artery disease involving native coronary artery of native heart with angina pectoris (HCC) - oxyCODONE 10 MG TABS; Take 1 tablet (10 mg total) by mouth every 4 (four) hours as needed for severe pain.  Dispense: 90 tablet; Refill: 0  2. Type 1 diabetes mellitus with hyperglycemia (HCC) Continue medications  3. S/P CABG x 2 Continue with cardiology  4. Primary insomnia trazodone  5. Anxiety - ALPRAZolam (XANAX) 1 MG tablet; Take 0.5 tablets (0.5 mg total) by mouth 3 (three) times daily as needed for anxiety.  Dispense: 75 tablet; Refill: 5 - busPIRone (BUSPAR) 15 MG tablet; Take 1 tablet (15 mg total) by mouth 3 (three) times daily.  Dispense: 90 tablet; Refill: 11  6. Depression, major, single episode, mild (HCC) Continue medications  7. Weigh gain Try topamax 25-100 mg fdaily Recheck 1 months    Continue all other maintenance medications as listed above.  Follow up plan: Return in about 4 weeks (around  05/23/2018) for recheck.  Educational handout given for [place] PA-C [place] [address]  [place], [place] [address] [phone]   04/27/2018, 9:38 PM

## 2018-05-27 ENCOUNTER — Telehealth: Payer: Self-pay | Admitting: Physician Assistant

## 2018-05-27 ENCOUNTER — Encounter: Payer: Self-pay | Admitting: Physician Assistant

## 2018-05-27 ENCOUNTER — Ambulatory Visit (INDEPENDENT_AMBULATORY_CARE_PROVIDER_SITE_OTHER): Payer: No Typology Code available for payment source | Admitting: Physician Assistant

## 2018-05-27 VITALS — BP 121/83 | HR 86 | Temp 99.3°F | Ht 60.0 in | Wt 160.4 lb

## 2018-05-27 DIAGNOSIS — I25119 Atherosclerotic heart disease of native coronary artery with unspecified angina pectoris: Secondary | ICD-10-CM

## 2018-05-27 DIAGNOSIS — M545 Low back pain: Secondary | ICD-10-CM

## 2018-05-27 DIAGNOSIS — M25551 Pain in right hip: Secondary | ICD-10-CM | POA: Diagnosis not present

## 2018-05-27 DIAGNOSIS — G8929 Other chronic pain: Secondary | ICD-10-CM

## 2018-05-27 DIAGNOSIS — R635 Abnormal weight gain: Secondary | ICD-10-CM

## 2018-05-27 MED ORDER — OXYCODONE HCL 10 MG PO TABS: 10.0000 mg | ORAL_TABLET | Freq: Three times a day (TID) | ORAL | 0 refills | Status: AC | PRN

## 2018-05-27 MED ORDER — TOPIRAMATE 100 MG PO TABS: 100.0000 mg | ORAL_TABLET | Freq: Two times a day (BID) | ORAL | 5 refills | Status: DC

## 2018-05-27 MED ORDER — OXYCODONE HCL 10 MG PO TABS: 10.0000 mg | ORAL_TABLET | ORAL | 0 refills | Status: DC | PRN

## 2018-05-27 NOTE — Telephone Encounter (Signed)
Spoke with Stew and he is going to cancel the every 4 hours. If you want her to have another one on file can you please send.

## 2018-05-30 NOTE — Progress Notes (Signed)
BP 121/83   Pulse 86   Temp 99.3 F (37.4 C) (Oral)   Ht 5' (1.524 m)   Wt 160 lb 6.4 oz (72.8 kg)   LMP 04/17/2014   BMI 31.33 kg/m    Subjective:    Patient ID: Melissa Rubio, female    DOB: September 23, 1970, 47 y.o.   MRN: [id number]  HPI: Melissa Rubio is a 47 y.o. female presenting on 05/27/2018 for Weight Gain (1 month follow up)  This patient comes in for 1 month recheck on her weight loss efforts and her chronic disability.  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  She is still followed by cardiology.  She also has chronic back pain related to degenerative disc disease.  She also has severe pain that goes into her right hip.  She also experiences restless leg syndrome.  All of her medications are reviewed.  She is down 3pounds this month.  I have commended her on how good of a result that is.  We will continue all her regular medications.  And she is working on smoking cessation at this time.  Past Medical History:  Diagnosis Date  . Anxiety   . Cancer [facility]) 1994   cervical  . Coronary artery disease    2/19 PCI/DES x1 to dRCA with LM, LAD disease (planned for possible CABG in near future), normal EF  . Coronary artery disease involving native coronary artery of native heart with angina pectoris ([place])   . Diabetes ([place])   . Hypertension   . Itching   . Myocardial infarction ([place])   . RLS (restless legs syndrome)   . 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 Tobacco use    Relevant past medical, surgical, family and social history reviewed and updated as indicated. Interim medical history since our last visit reviewed. Allergies and medications reviewed and updated. DATA REVIEWED: CHART IN EPIC  Family History reviewed for pertinent findings.  Review of Systems  Constitutional: Negative.   HENT: Negative.   Eyes: Negative.   Respiratory: Negative.   Gastrointestinal: Negative.     Genitourinary: Negative.   Musculoskeletal: Positive for arthralgias, gait problem and myalgias.  Psychiatric/Behavioral: Positive for dysphoric mood. Negative for agitation and decreased concentration. The patient is nervous/anxious.     Allergies as of 05/27/2018   No Known Allergies     Medication List        Accurate as of 05/27/18 11:59 PM. Always use your most recent med list.          acetaminophen 325 MG tablet Commonly known as:  TYLENOL Take 650 mg by mouth 2 (two) times daily as needed for moderate pain or headache.   ALPRAZolam 1 MG tablet Commonly known as:  XANAX Take 0.5 tablets (0.5 mg total) by mouth 3 (three) times daily as needed for anxiety.   aspirin 81 MG chewable tablet Chew 1 tablet (81 mg total) by mouth daily.   busPIRone 15 MG tablet Commonly known as:  BUSPAR Take 1 tablet (15 mg total) by mouth 3 (three) times daily.   carvedilol 3.125 MG tablet Commonly known as:  COREG Take 1 tablet (3.125 mg total) by mouth 2 (two) times daily with a meal.   citalopram 40 MG tablet Commonly known as:  CELEXA Take 1 tablet (40 mg total) by mouth daily.  clopidogrel 75 MG tablet Commonly known as:  PLAVIX Take 1 tablet (75 mg total) by mouth daily.   cyclobenzaprine 10 MG tablet Commonly known as:  FLEXERIL Take 1 tablet (10 mg total) by mouth 3 (three) times daily as needed for muscle spasms.   ezetimibe 10 MG tablet Commonly known as:  ZETIA Take 1 tablet (10 mg total) by mouth daily.   ferrous sulfate 325 (65 FE) MG tablet Take 1 tablet (325 mg total) by mouth daily with breakfast. For one month then stop.   gabapentin 800 MG tablet Commonly known as:  NEURONTIN Take 1 tablet (800 mg total) by mouth 4 (four) times daily.   hydrOXYzine 50 MG tablet Commonly known as:  ATARAX/VISTARIL Take 1 tablet (50 mg total) by mouth every 8 (eight) hours as needed.   ibuprofen 800 MG tablet Commonly known as:  ADVIL,MOTRIN Take 1 tablet (800 mg  total) by mouth every 8 (eight) hours as needed.   ICY HOT EX Apply 1 application topically daily as needed (back pain).   insulin NPH Human 100 UNIT/ML injection Commonly known as:  HUMULIN N,NOVOLIN N Inject 0.4 mLs (40 Units total) 2 (two) times daily before a meal into the skin.   INSULIN SYRINGE .5CC/29G 29G X 1/2" 0.5 ML Misc 1 Syringe by Does not apply route 2 (two) times daily.   lisinopril 2.5 MG tablet Commonly known as:  PRINIVIL,ZESTRIL Take 1 tablet (2.5 mg total) by mouth daily.   metFORMIN 1000 MG tablet Commonly known as:  GLUCOPHAGE Take 1 tablet (1,000 mg total) by mouth 2 (two) times daily with a meal.   nicotine 21 mg/24hr patch Commonly known as:  NICODERM CQ - dosed in mg/24 hours Place 1 patch (21 mg total) onto the skin daily.   omeprazole 20 MG capsule Commonly known as:  PRILOSEC Take 20 mg by mouth daily.   Oxycodone HCl 10 MG Tabs Take 1 tablet (10 mg total) by mouth 3 (three) times daily as needed.   Oxycodone HCl 10 MG Tabs Take 1 tablet (10 mg total) by mouth 3 (three) times daily as needed (pain).   rosuvastatin 40 MG tablet Commonly known as:  CRESTOR Take 1 tablet (40 mg total) by mouth daily.   topiramate 100 MG tablet Commonly known as:  TOPAMAX Take 1-1.5 tablets (100-150 mg total) by mouth 2 (two) times daily.          Objective:    BP 121/83   Pulse 86   Temp 99.3 F (37.4 C) (Oral)   Ht 5' (1.524 m)   Wt 160 lb 6.4 oz (72.8 kg)   LMP 04/17/2014   BMI 31.33 kg/m   No Known Allergies  Wt Readings from Last 3 Encounters:  05/27/18 160 lb 6.4 oz (72.8 kg)  04/25/18 163 lb 6.4 oz (74.1 kg)  04/02/18 164 lb 9.6 oz (74.7 kg)    Physical Exam  Constitutional: She is oriented to person, place, and time. She appears well-developed and well-nourished.  HENT:  Head: Normocephalic and atraumatic.  Eyes: Pupils are equal, round, and reactive to light. Conjunctivae and EOM are normal.  Cardiovascular: Normal rate, regular  rhythm, normal heart sounds and intact distal pulses.  Pulmonary/Chest: Effort normal and breath sounds normal.  Abdominal: Soft. Bowel sounds are normal.  Musculoskeletal:       Right hip: She exhibits decreased range of motion and tenderness.       Lumbar back: She exhibits decreased range of motion, tenderness, pain  and spasm.       Back:  Neurological: She is alert and oriented to person, place, and time. She has normal reflexes.  Skin: Skin is warm and dry. No rash noted.  Psychiatric: She has a normal mood and affect. Her behavior is normal. Judgment and thought content normal.    Results for orders placed or performed in visit on 03/25/18  CMP14+EGFR  Result Value Ref Range   Glucose 122 (H) 65 - 99 mg/dL   BUN 8 6 - 24 mg/dL   Creatinine, Ser 0.58 0.57 - 1.00 mg/dL   GFR calc non Af Amer 110 >59 mL/min/1.73   GFR calc Af Amer 127 >59 mL/min/1.73   BUN/Creatinine Ratio 14 9 - 23   Sodium 141 134 - 144 mmol/L   Potassium 4.7 3.5 - 5.2 mmol/L   Chloride 103 96 - 106 mmol/L   CO2 17 (L) 20 - 29 mmol/L   Calcium 9.8 8.7 - 10.2 mg/dL   Total Protein 7.7 6.0 - 8.5 g/dL   Albumin 4.5 3.5 - 5.5 g/dL   Globulin, Total 3.2 1.5 - 4.5 g/dL   Albumin/Globulin Ratio 1.4 1.2 - 2.2   Bilirubin Total <0.2 0.0 - 1.2 mg/dL   Alkaline Phosphatase 105 39 - 117 IU/L   AST 13 0 - 40 IU/L   ALT 11 0 - 32 IU/L  CBC with Differential/Platelet  Result Value Ref Range   WBC 9.3 3.4 - 10.8 x10E3/uL   RBC 4.88 3.77 - 5.28 x10E6/uL   Hemoglobin 13.4 11.1 - 15.9 g/dL   Hematocrit 41.4 34.0 - 46.6 %   MCV 85 79 - 97 fL   MCH 27.5 26.6 - 33.0 pg   MCHC 32.4 31.5 - 35.7 g/dL   RDW 16.8 (H) 12.3 - 15.4 %   Platelets 371 150 - 450 x10E3/uL   Neutrophils 62 Not Estab. %   Lymphs 31 Not Estab. %   Monocytes 6 Not Estab. %   Eos 1 Not Estab. %   Basos 0 Not Estab. %   Neutrophils Absolute 5.8 1.4 - 7.0 x10E3/uL   Lymphocytes Absolute 2.9 0.7 - 3.1 x10E3/uL   Monocytes Absolute 0.6 0.1 - 0.9  x10E3/uL   EOS (ABSOLUTE) 0.1 0.0 - 0.4 x10E3/uL   Basophils Absolute 0.0 0.0 - 0.2 x10E3/uL   Immature Granulocytes 0 Not Estab. %   Immature Grans (Abs) 0.0 0.0 - 0.1 x10E3/uL  Lipid panel  Result Value Ref Range   Cholesterol, Total 225 (H) 100 - 199 mg/dL   Triglycerides 236 (H) 0 - 149 mg/dL   HDL 33 (L) >39 mg/dL   VLDL Cholesterol Cal 47 (H) 5 - 40 mg/dL   LDL Calculated 145 (H) 0 - 99 mg/dL   Chol/HDL Ratio 6.8 (H) 0.0 - 4.4 ratio  TSH  Result Value Ref Range   TSH 0.788 0.450 - 4.500 uIU/mL  Bayer DCA Hb A1c Waived  Result Value Ref Range   HB A1C (BAYER DCA - WAIVED) 7.3 (H) <7.0 %      Assessment & Plan:   1. Coronary artery disease involving native coronary artery of native heart with angina pectoris (HCC) - Oxycodone HCl 10 MG TABS; Take 1 tablet (10 mg total) by mouth 3 (three) times daily as needed (pain).  Dispense: 90 tablet; Refill: 0  2. Chronic right-sided low back pain without sciatica - Ambulatory referral to Orthopedic Surgery  3. Pain of right hip joint - Ambulatory referral to Orthopedic Surgery  4. Weight gain - topiramate (TOPAMAX) 100 MG tablet; Take 1-1.5 tablets (100-150 mg total) by mouth 2 (two) times daily.  Dispense: 60 tablet; Refill: 5   Continue all other maintenance medications as listed above.  Follow up plan: Return in about 2 months (around 07/27/2018).  Educational handout given for [place] PA-C [facility] [address]  [place], [place] [address] [phone]   05/30/2018, 9:19 AM

## 2018-06-04 ENCOUNTER — Ambulatory Visit (INDEPENDENT_AMBULATORY_CARE_PROVIDER_SITE_OTHER): Payer: No Typology Code available for payment source | Admitting: Orthopaedic Surgery

## 2018-06-12 ENCOUNTER — Encounter (INDEPENDENT_AMBULATORY_CARE_PROVIDER_SITE_OTHER): Payer: Self-pay | Admitting: Orthopaedic Surgery

## 2018-06-12 ENCOUNTER — Ambulatory Visit (INDEPENDENT_AMBULATORY_CARE_PROVIDER_SITE_OTHER): Payer: No Typology Code available for payment source

## 2018-06-12 ENCOUNTER — Ambulatory Visit (INDEPENDENT_AMBULATORY_CARE_PROVIDER_SITE_OTHER): Payer: No Typology Code available for payment source | Admitting: Orthopaedic Surgery

## 2018-06-12 VITALS — BP 109/76 | HR 111 | Ht 60.0 in | Wt 160.0 lb

## 2018-06-12 DIAGNOSIS — M545 Low back pain, unspecified: Secondary | ICD-10-CM

## 2018-06-12 DIAGNOSIS — G8929 Other chronic pain: Secondary | ICD-10-CM

## 2018-06-12 NOTE — Progress Notes (Signed)
Office Visit Note/orthopedic consultation   Patient: Melissa Rubio           Date of Birth: 1971/04/03           MRN: [id number] Visit Date: 06/12/2018              Requested by: Terald Sleeper, PA-C [address] [place], [place] [address] PCP: Terald Sleeper, PA-C   Assessment & Plan: Visit Diagnoses:  1. Chronic right-sided low back pain, unspecified whether sciatica present     Plan: I discussed patient would not recommend she take narcotic medication for her current problem.  She has core weakness and all further physical therapy if she is still having symptoms after the holidays we will proceed with lumbar MRI scan to rule out nerve compression.  No evidence of radiculopathy on exam.  He has anxiety and depression I encouraged her to go ahead and finish her smoking cessation plan she is cut way back.  Thank you for the opportunity to see her in consultation.  She lives in [place] we arranged with physical therapy at [facility].  Follow-Up Instructions: No follow-ups on file.   Orders:  Orders Placed This Encounter  Procedures  . XR Lumbar Spine 2-3 Views  . XR Pelvis 1-2 Views   No orders of the defined types were placed in this encounter.     Procedures: No procedures performed   Clinical Data: No additional findings.   Subjective: Chief Complaint  Patient presents with  . Lower Back - Pain  . Right Leg - Pain    HPI 47 year old female here for orthopedic consultation from [facility] and  states she has had 1 year history of back pain.  She had cardiac bypass surgery June states she is also having chest pain from her incision.  Recent [facility] cardiology visit through the ER negative for nonexercise stress test with good ejection fraction.  She is taken ibuprofen, heat, patches, IcyHot for back without relief.  She is not been through therapy she is not able to do a single set up and she was placed on Percocet 10/325   3 tablets a day by Particia Nearing, PA-C  and states she does not take them that often just takes them when she needs them.  Review of Systems 10 point review of system positive for diabetes, anxiety GERD, depression, insomnia, coronary disease with bypass surgery June 2019.  Patient states she still has pain in her chest from the surgery.   Objective: Vital Signs: BP 109/76   Pulse (!) 111   Ht 5' (1.524 m)   Wt 160 lb (72.6 kg)   LMP 04/17/2014   BMI 31.25 kg/m   Physical Exam  Constitutional: She is oriented to person, place, and time. She appears well-developed.  HENT:  Head: Normocephalic.  Right Ear: External ear normal.  Left Ear: External ear normal.  Eyes: Pupils are equal, round, and reactive to light.  Neck: No tracheal deviation present. No thyromegaly present.  Cardiovascular: Normal rate.  Median sternotomy, scars well-healed.  Pulmonary/Chest: Effort normal.  Abdominal: Soft.  Neurological: She is alert and oriented to person, place, and time.  Skin: Skin is warm and dry.  Psychiatric: She has a normal mood and affect. Her behavior is normal.    Ortho Exam patient ambulates with a slow stride gait keeping her heel off the ground on the right side.  She ambulates with a slight knee flexed gait on the right.  Opposite left foot is normal heel toe gait.  Negative logroll to the hips.  No pain with 30 to 40 degrees internal rotation right or left hip.  Knee and ankle jerk are 1+ and symmetrical.  Pulses are intact.  Specialty Comments:  No specialty comments available.  Imaging: No results found.   PMFS History: Patient Active Problem List   Diagnosis Date Noted  . Chronic right-sided low back pain without sciatica 03/26/2018  . 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 Chest pain 12/05/2017  . Coronary artery disease due to lipid rich plaque   . Hyperlipidemia with target LDL less than 70   . Coronary artery disease involving native coronary artery of native heart with angina pectoris ([place])   . Tobacco use  09/03/2017  . Insomnia 07/23/2015  . BMI 29.0-29.9,adult 07/22/2015  . GERD (gastroesophageal reflux disease) 11/15/2014  . Depression 11/15/2014  . Diabetes ([place])   . Anxiety   . Hypertension   . RLS (restless legs syndrome)    Past Medical History:  Diagnosis Date  . Anxiety   . Cancer [facility]) 1994   cervical  . Coronary artery disease    2/19 PCI/DES x1 to dRCA with LM, LAD disease (planned for possible CABG in near future), normal EF  . Coronary artery disease involving native coronary artery of native heart with angina pectoris ([place])   . Diabetes ([place])   . Hypertension   . Itching   . Myocardial infarction ([place])   . RLS (restless legs syndrome)   . 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 Tobacco use     Family History  Problem Relation Age of Onset  . Cancer Mother   . Heart attack Father   . Diabetes Sister   . Diabetes Brother   . Diabetes Sister   . Heart attack Sister     Past Surgical History:  Procedure Laterality Date  . CARDIAC CATHETERIZATION    . CORONARY ARTERY BYPASS GRAFT N/A 12/12/2017   Procedure: MEDIAN STERNOTOMY for CORONARY ARTERY BYPASS GRAFTING (CABG) x 2 (LIMA to LAD, SVG to OM) using RIGHT THIGH GREATER SAPHENOUS VEIN and LEFT INTERNAL MAMMARY ARTERY;  Surgeon: Rexene Alberts, MD;  Location: [facility];  Service: Open Heart Surgery;  Laterality: N/A;  . CORONARY STENT INTERVENTION N/A 09/01/2017   Procedure: CORONARY STENT INTERVENTION;  Surgeon: Martinique, Peter M, MD;  Location: [place] [facility];  Service: Cardiovascular;  Laterality: N/A;  . CORONARY/GRAFT ACUTE MI REVASCULARIZATION N/A 09/01/2017   Procedure: Coronary/Graft Acute MI Revascularization;  Surgeon: Martinique, Peter M, MD;  Location: [place] [facility];  Service: Cardiovascular;  Laterality: N/A;  . LEFT HEART CATH AND CORONARY ANGIOGRAPHY N/A 09/01/2017   Procedure: LEFT HEART CATH AND CORONARY ANGIOGRAPHY;  Surgeon: Martinique, Peter M, MD;  Location: [place] [facility];  Service:  Cardiovascular;  Laterality: N/A;  . PARTIAL HYSTERECTOMY  2009  . TEE WITHOUT CARDIOVERSION N/A 12/12/2017   Procedure: TRANSESOPHAGEAL ECHOCARDIOGRAM (TEE);  Surgeon: Rexene Alberts, MD;  Location: [facility];  Service: Open Heart Surgery;  Laterality: N/A;   Social History   Occupational History  . Not on file  Tobacco Use  . Smoking status: Current Some Day Smoker    Packs/day: 0.75    Years: 35.00    Pack years: 26.25    Types: Cigarettes    Start date: 09/01/1981  . Smokeless tobacco: Never Used  . Tobacco comment: patient has admitted  to continued smoking, but is trying to quit  Substance and Sexual Activity  . Alcohol use: No    Frequency: Never  . Drug use: Never  . Sexual activity: Not on file

## 2018-06-13 NOTE — Addendum Note (Signed)
Addended by: Meyer Cory on: 06/13/2018 09:19 AM   Modules accepted: Orders

## 2018-06-17 IMAGING — DX DG HIP UNILAT W OR W/O PELVIS 2-3 VIEWS RIGHT
2 series · 2 of 2 positions shown · non-contrast
Comparison: No recent prior .

CLINICAL DATA: Pain.  No injury.

EXAM:
DG HIP (WITH OR WITHOUT PELVIS) 2-3V RIGHT

[pelvis ap]
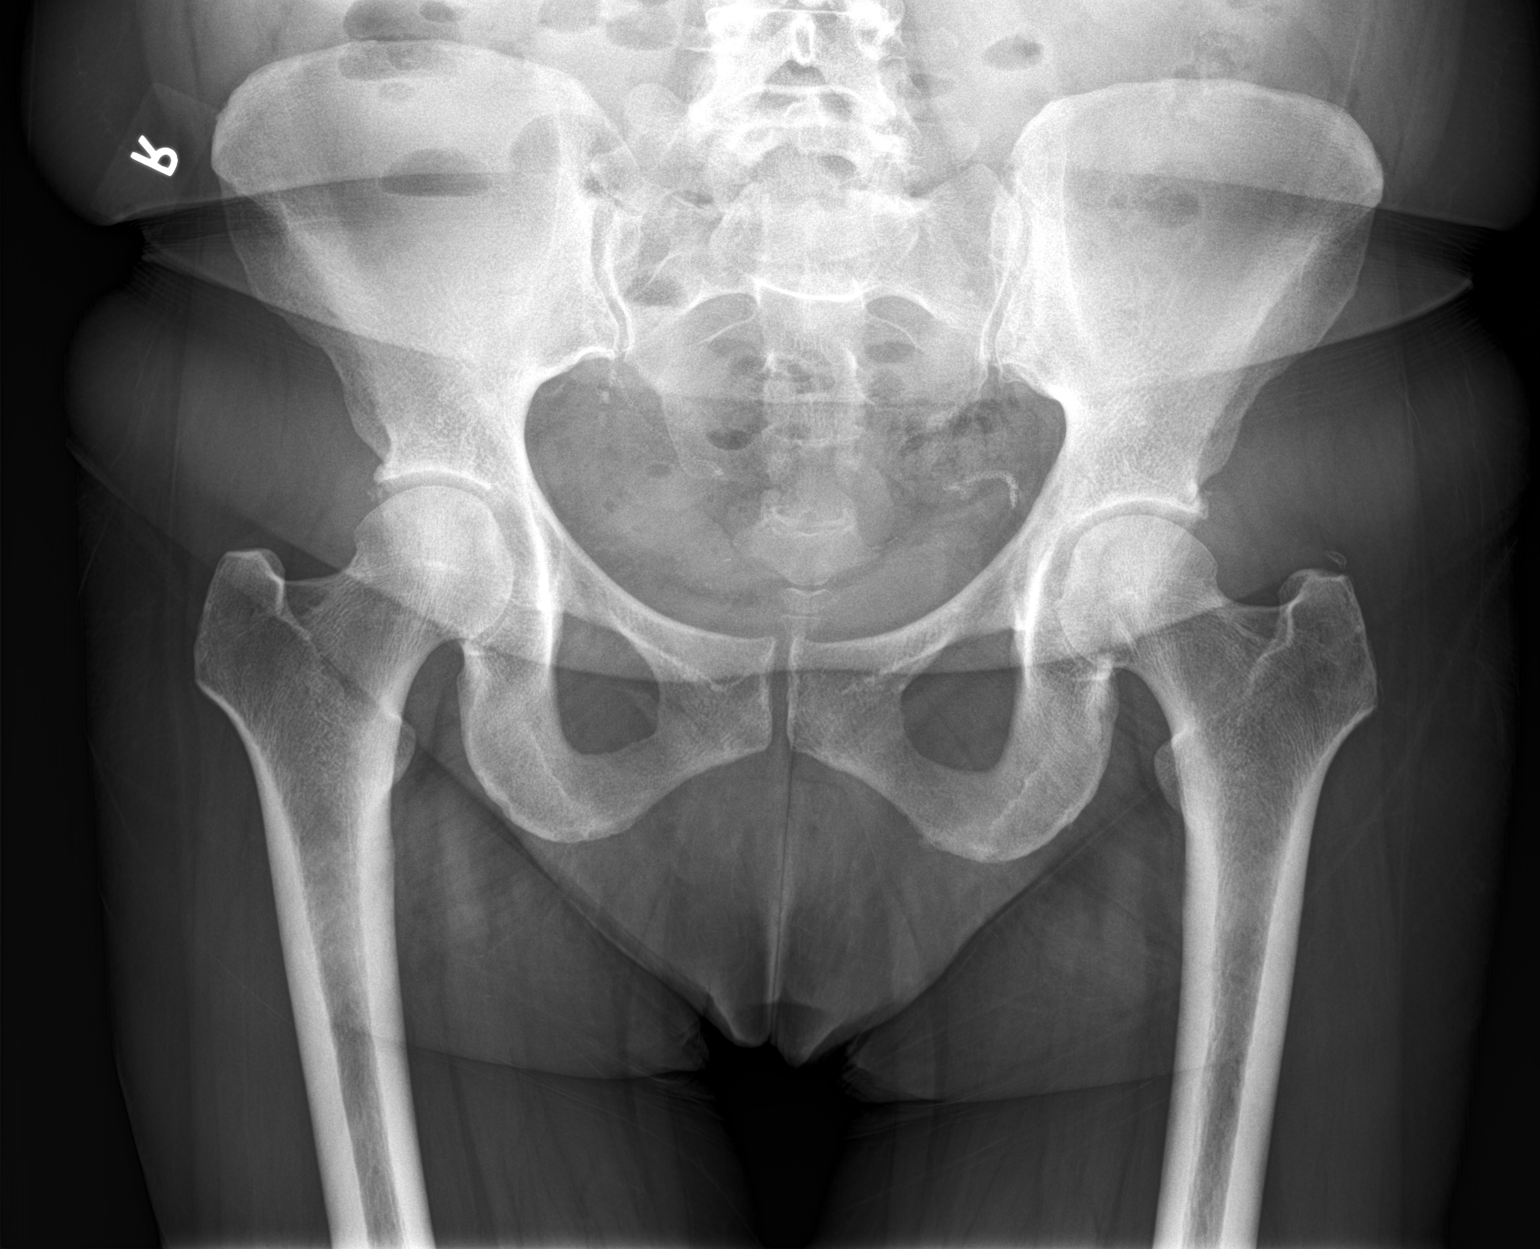

[hip lat]
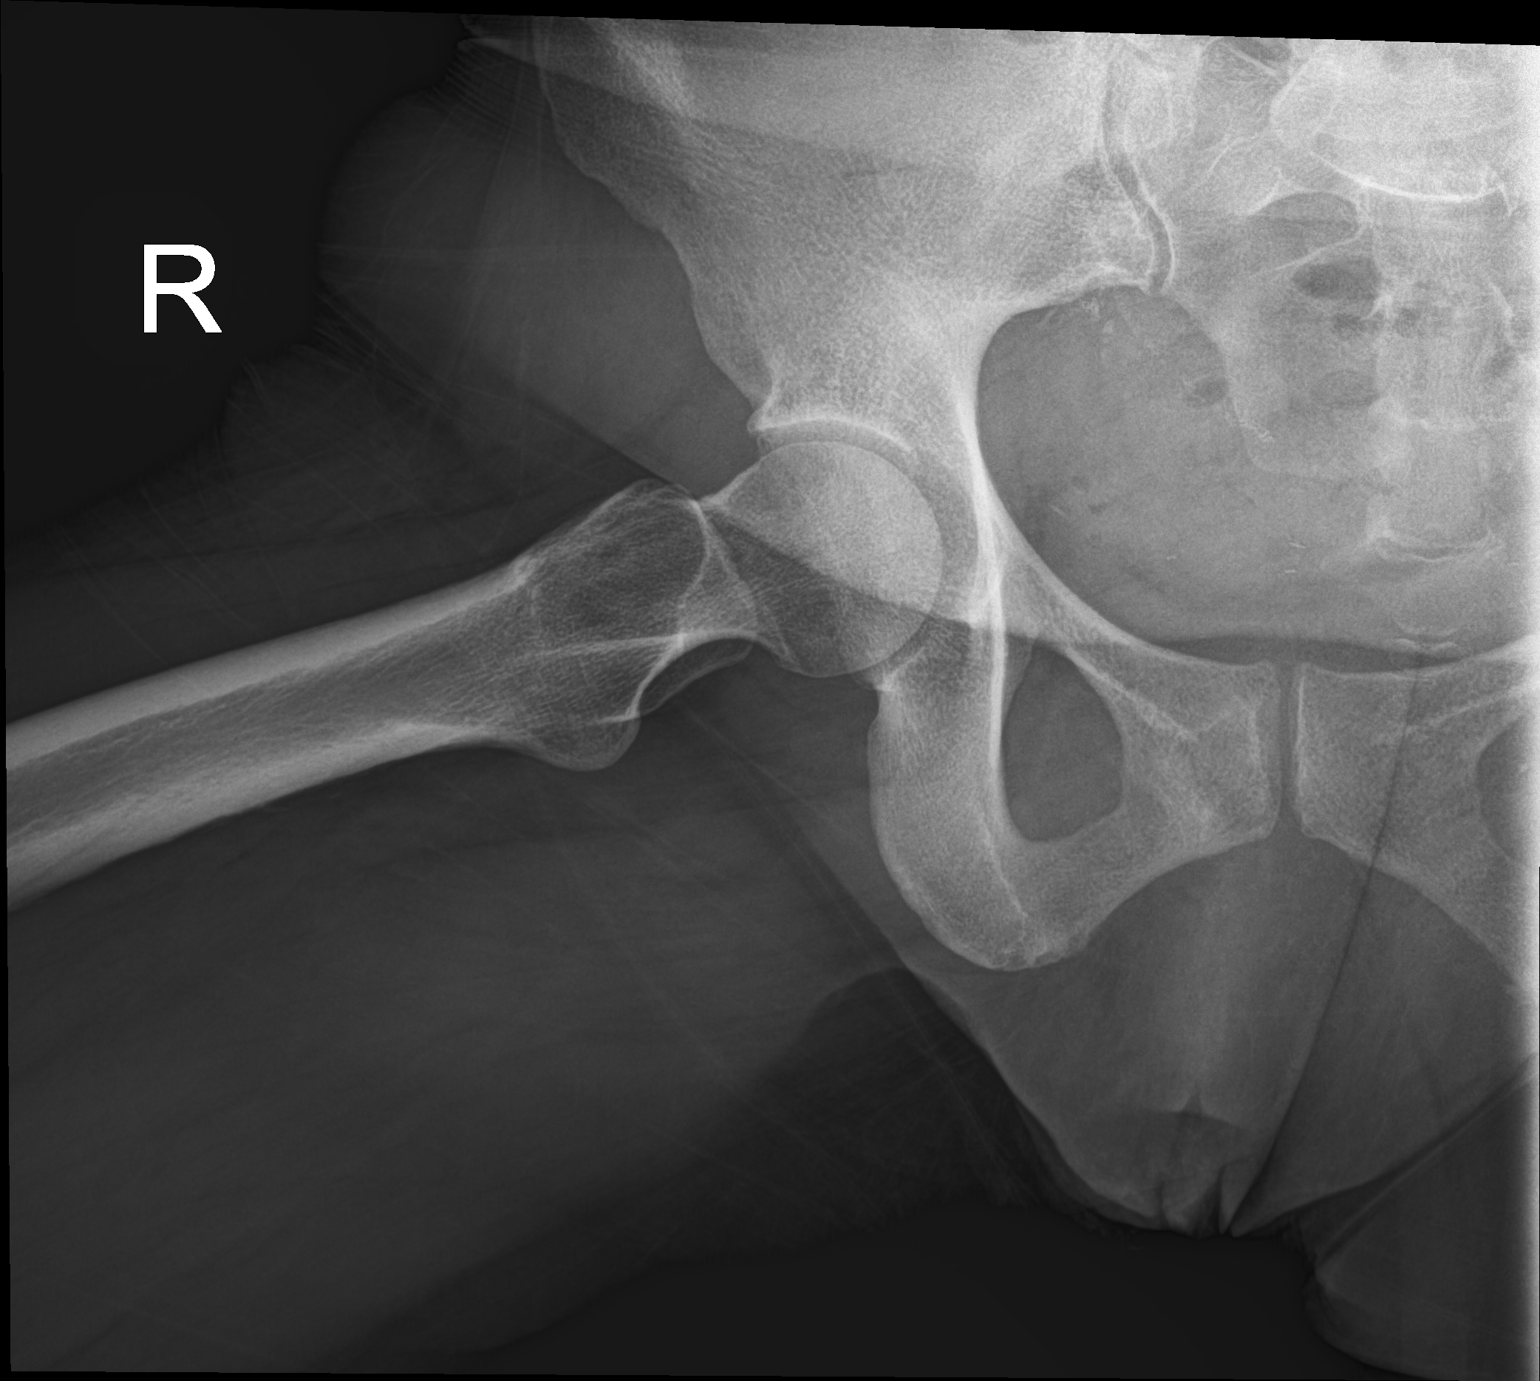

[2 of 2 positions shown; findings below may reference images not displayed]

FINDINGS: No acute bony or joint abnormality identified. No evidence of
fracture dislocation. Vascular calcification. Scratched it surgical
sutures in the pelvis .
IMPRESSION: No acute or focal bony abnormality identified. No evidence of
fracture or dislocation.

## 2018-07-17 ENCOUNTER — Encounter (INDEPENDENT_AMBULATORY_CARE_PROVIDER_SITE_OTHER): Payer: Self-pay | Admitting: Orthopaedic Surgery

## 2018-07-17 ENCOUNTER — Ambulatory Visit (INDEPENDENT_AMBULATORY_CARE_PROVIDER_SITE_OTHER): Payer: No Typology Code available for payment source | Admitting: Orthopaedic Surgery

## 2018-07-17 VITALS — BP 133/88 | HR 92 | Ht 60.0 in | Wt 166.0 lb

## 2018-07-17 DIAGNOSIS — M545 Low back pain: Secondary | ICD-10-CM | POA: Diagnosis not present

## 2018-07-17 DIAGNOSIS — G8929 Other chronic pain: Secondary | ICD-10-CM | POA: Diagnosis not present

## 2018-07-17 NOTE — Addendum Note (Signed)
Addended by: Meyer Cory on: 07/17/2018 04:13 PM   Modules accepted: Orders

## 2018-07-17 NOTE — Progress Notes (Signed)
Office Visit Note   Patient: Melissa Rubio           Date of Birth: [birth year]           MRN: [id number] Visit Date: 07/17/2018              Requested by: Terald Sleeper, PA-C [address] [place], [place] [address] PCP: Terald Sleeper, PA-C   Assessment & Plan: Visit Diagnoses:  1. Chronic right-sided low back pain without sciatica     Plan: Patient been treated with medication felt physical therapy persistent chronic back pain and right leg pain.  We will proceed with MRI scan office follow-up after scan for review.  Follow-Up Instructions: No follow-ups on file.   Orders:  No orders of the defined types were placed in this encounter.  No orders of the defined types were placed in this encounter.     Procedures: No procedures performed   Clinical Data: No additional findings.   Subjective: Chief Complaint  Patient presents with  . Lower Back - Pain, Follow-up    HPI-year-old female returns for chronic ongoing back pain radiates into her buttocks and her right thigh.  Said the pain for greater than 2 years she is been through physical therapy.  Since October she has been on Percocet 10 mg up to 3 tablets a day as needed for pain.  When she has increased pain she walks around on her toe on the right foot not touching her heel down she states is the only way she can get some relief.  Review of Systems 14 point update unchanged from 02/28/2017.  Previous cardiac bypass surgery.  She states her A1c is in the sevens.  2 years ago was greater than 14.  Positive for depression hypertension migraines chronic low back pain.   Objective: Vital Signs: BP 133/88   Pulse 92   Ht 5' (1.524 m)   Wt 166 lb (75.3 kg)   LMP 04/17/2014   BMI 32.42 kg/m   Physical Exam Constitutional:      Appearance: She is well-developed.  HENT:     Head: Normocephalic.     Right Ear: External ear normal.     Left Ear: External ear normal.  Eyes:     Pupils: Pupils are equal, round, and  reactive to light.  Neck:     Thyroid: No thyromegaly.     Trachea: No tracheal deviation.  Cardiovascular:     Rate and Rhythm: Normal rate.     Comments: Healed median sternotomy. Pulmonary:     Effort: Pulmonary effort is normal.  Abdominal:     Palpations: Abdomen is soft.  Skin:    General: Skin is warm and dry.  Neurological:     Mental Status: She is alert and oriented to person, place, and time.  Psychiatric:        Behavior: Behavior normal.     Ortho Exam withdraws complains of pain with static palpation both right and left.  No trochanteric bursal tenderness negative logroll of the hips.  Knees reach full extension she is able to heel and toe walk today.  Distal pulses are intact.  Specialty Comments:  No specialty comments available.  Imaging: No results found.   PMFS History: Patient Active Problem List   Diagnosis Date Noted  . Chronic right-sided low back pain without sciatica 03/26/2018  . 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 Chest pain 12/05/2017  . Coronary artery disease due to lipid  rich plaque   . Hyperlipidemia with target LDL less than 70   . Coronary artery disease involving native coronary artery of native heart with angina pectoris ([place])   . Tobacco use 09/03/2017  . Insomnia 07/23/2015  . BMI 29.0-29.9,adult 07/22/2015  . GERD (gastroesophageal reflux disease) 11/15/2014  . Depression 11/15/2014  . Diabetes ([place])   . Anxiety   . Hypertension   . RLS (restless legs syndrome)    Past Medical History:  Diagnosis Date  . Anxiety   . Cancer [facility]) 1994   cervical  . Coronary artery disease    2/19 PCI/DES x1 to dRCA with LM, LAD disease (planned for possible CABG in near future), normal EF  . Coronary artery disease involving native coronary artery of native heart with angina pectoris ([place])   . Diabetes ([place])   . Hypertension   . Itching   . Myocardial infarction ([place])   . RLS (restless legs syndrome)   . 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 Tobacco use     Family History  Problem Relation Age of Onset  . Cancer Mother   . Heart attack Father   . Diabetes Sister   . Diabetes Brother   . Diabetes Sister   . Heart attack Sister     Past Surgical History:  Procedure Laterality Date  . CARDIAC CATHETERIZATION    . CORONARY ARTERY BYPASS GRAFT N/A 12/12/2017   Procedure: MEDIAN STERNOTOMY for CORONARY ARTERY BYPASS GRAFTING (CABG) x 2 (LIMA to LAD, SVG to OM) using RIGHT THIGH GREATER SAPHENOUS VEIN and LEFT INTERNAL MAMMARY ARTERY;  Surgeon: Rexene Alberts, MD;  Location: [facility];  Service: Open Heart Surgery;  Laterality: N/A;  . CORONARY STENT INTERVENTION N/A 09/01/2017   Procedure: CORONARY STENT INTERVENTION;  Surgeon: Martinique, Peter M, MD;  Location: [facility];  Service: Cardiovascular;  Laterality: N/A;  . CORONARY/GRAFT ACUTE MI REVASCULARIZATION N/A 09/01/2017   Procedure: Coronary/Graft Acute MI Revascularization;  Surgeon: Martinique, Peter M, MD;  Location: [place] [facility];  Service: Cardiovascular;  Laterality: N/A;  . LEFT HEART CATH AND CORONARY ANGIOGRAPHY N/A 09/01/2017   Procedure: LEFT HEART CATH AND CORONARY ANGIOGRAPHY;  Surgeon: Martinique, Peter M, MD;  Location: [place] [facility];  Service: Cardiovascular;  Laterality: N/A;  . PARTIAL HYSTERECTOMY  2009  . TEE WITHOUT CARDIOVERSION N/A 12/12/2017   Procedure: TRANSESOPHAGEAL ECHOCARDIOGRAM (TEE);  Surgeon: Rexene Alberts, MD;  Location: [place] [facility] [place];  Service: Open Heart Surgery;  Laterality: N/A;   Social History   Occupational History  . Not on file  Tobacco Use  . Smoking status: Current Some Day Smoker    Packs/day: 0.75    Years: 35.00    Pack years: 26.25    Types: Cigarettes    Start date: 09/01/1981  . Smokeless tobacco: Never Used  . Tobacco comment: patient has admitted to continued smoking, but is trying to quit  Substance and Sexual Activity  . Alcohol use: No    Frequency: Never  . Drug use: Never  . Sexual activity: Not on  file

## 2018-07-24 ENCOUNTER — Encounter (INDEPENDENT_AMBULATORY_CARE_PROVIDER_SITE_OTHER): Payer: Self-pay | Admitting: Orthopaedic Surgery

## 2018-07-24 ENCOUNTER — Ambulatory Visit (INDEPENDENT_AMBULATORY_CARE_PROVIDER_SITE_OTHER): Payer: No Typology Code available for payment source | Admitting: Orthopaedic Surgery

## 2018-07-24 VITALS — BP 117/65 | HR 97 | Ht 60.0 in | Wt 166.0 lb

## 2018-07-24 DIAGNOSIS — M545 Low back pain: Secondary | ICD-10-CM | POA: Diagnosis not present

## 2018-07-24 DIAGNOSIS — G8929 Other chronic pain: Secondary | ICD-10-CM | POA: Diagnosis not present

## 2018-07-24 NOTE — Progress Notes (Signed)
Office Visit Note   Patient: Melissa Rubio           Date of Birth: 20-Mar-1971           MRN: [id number] Visit Date: 07/24/2018              Requested by: Terald Sleeper, PA-C [address] [place], [place] [address] PCP: Terald Sleeper, PA-C   Assessment & Plan: Visit Diagnoses:  1. Chronic right-sided low back pain without sciatica     Plan: Patient is having back pain right leg pain.  We will set up for single epidural.  We discussed potential for hyperglycemia.  She has some foraminal narrowing on the right at L4-5.  We discussed checking her CBGs regularly after the injection and adjusting her insulin to cover hyperglycemia so that her A1c will not be increased.  She understands that this may last up to a week.  She can follow-up with me in 3 months.  Again we had a long discussion about narcotic medication and at times when she is tried to decrease dose she has had increased pain which would be expected.  I reviewed the MRI scan report with her.  No indications for operative intervention at this time.  Follow-Up Instructions: Return in about 3 months (around 10/23/2018).   Orders:  Orders Placed This Encounter  Procedures  . Ambulatory referral to Physical Medicine Rehab   No orders of the defined types were placed in this encounter.     Procedures: No procedures performed   Clinical Data: No additional findings.   Subjective: Chief Complaint  Patient presents with  . Lower Back - Pain, Follow-up    MRI Lumbar Review    HPI 48 year old female returns with chronic problems with low back pain and has had MRI scan available for review of the lumbar spine.  She had cardiac bypass surgery in June and states she still has pain in her chest.  She relates that the back pain predates the median sternotomy.  She has been on oxycodone 10 mg 90 tablets a month since her surgery also takes alprazolam 1 mg strength 75 tablets monthly.  Review of Systems 14 point systems updated  unchanged from 02/28/2018 office visit.  Positive for diabetes past years and extremely poor control now improved with A1c improvement to 7.3.   Objective: Vital Signs: BP 117/65   Pulse 97   Ht 5' (1.524 m)   Wt 166 lb (75.3 kg)   LMP 04/17/2014   BMI 32.42 kg/m   Physical Exam Constitutional:      Appearance: She is well-developed.  HENT:     Head: Normocephalic.     Right Ear: External ear normal.     Left Ear: External ear normal.  Eyes:     Pupils: Pupils are equal, round, and reactive to light.  Neck:     Thyroid: No thyromegaly.     Trachea: No tracheal deviation.  Cardiovascular:     Rate and Rhythm: Normal rate.  Pulmonary:     Effort: Pulmonary effort is normal.  Abdominal:     Palpations: Abdomen is soft.  Skin:    General: Skin is warm and dry.  Neurological:     Mental Status: She is alert and oriented to person, place, and time.  Psychiatric:        Behavior: Behavior normal.     Ortho Exam patient ambulates without a limp.  She is able heel and toe walk.  Negative straight leg raising 90 degrees.  No trochanteric bursal tenderness negative logroll right and left hip.  She withdraws and has exquisite tenderness with palpation over the lumbar spine.  Specialty Comments:  No specialty comments available.  Imaging : Lumbar MRI scan 07/22/2018 shows mild disc degeneration at L4-5 and L5-S1 without central canal stenosis.  Mild bilateral foraminal narrowing at L4-5 most notable on the right.  Small central disc protrusion that does not contact the cord at T10-11.   PMFS History: Patient Active Problem List   Diagnosis Date Noted  . Chronic right-sided low back pain without sciatica 03/26/2018  . 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 Chest pain 12/05/2017  . Coronary artery disease due to lipid rich plaque   . Hyperlipidemia with target LDL less than 70   . Coronary artery disease involving native coronary artery of native heart with angina pectoris ([place])   .  Tobacco use 09/03/2017  . Insomnia 07/23/2015  . BMI 29.0-29.9,adult 07/22/2015  . GERD (gastroesophageal reflux disease) 11/15/2014  . Depression 11/15/2014  . Diabetes ([place])   . Anxiety   . Hypertension   . RLS (restless legs syndrome)    Past Medical History:  Diagnosis Date  . Anxiety   . Cancer [facility]) 1994   cervical  . Coronary artery disease    2/19 PCI/DES x1 to dRCA with LM, LAD disease (planned for possible CABG in near future), normal EF  . Coronary artery disease involving native coronary artery of native heart with angina pectoris ([place])   . Diabetes ([place])   . Hypertension   . Itching   . Myocardial infarction ([place])   . RLS (restless legs syndrome)   . 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 Tobacco use     Family History  Problem Relation Age of Onset  . Cancer Mother   . Heart attack Father   . Diabetes Sister   . Diabetes Brother   . Diabetes Sister   . Heart attack Sister     Past Surgical History:  Procedure Laterality Date  . CARDIAC CATHETERIZATION    . CORONARY ARTERY BYPASS GRAFT N/A 12/12/2017   Procedure: MEDIAN STERNOTOMY for CORONARY ARTERY BYPASS GRAFTING (CABG) x 2 (LIMA to LAD, SVG to OM) using RIGHT THIGH GREATER SAPHENOUS VEIN and LEFT INTERNAL MAMMARY ARTERY;  Surgeon: Rexene Alberts, MD;  Location: [facility];  Service: Open Heart Surgery;  Laterality: N/A;  . CORONARY STENT INTERVENTION N/A 09/01/2017   Procedure: CORONARY STENT INTERVENTION;  Surgeon: Martinique, Peter M, MD;  Location: [facility] CV LAB;  Service: Cardiovascular;  Laterality: N/A;  . CORONARY/GRAFT ACUTE MI REVASCULARIZATION N/A 09/01/2017   Procedure: Coronary/Graft Acute MI Revascularization;  Surgeon: Martinique, Peter M, MD;  Location: [facility];  Service: Cardiovascular;  Laterality: N/A;  . LEFT HEART CATH AND CORONARY ANGIOGRAPHY N/A 09/01/2017   Procedure: LEFT HEART CATH AND CORONARY ANGIOGRAPHY;  Surgeon: Martinique, Peter M, MD;  Location: [place] CV LAB;   Service: Cardiovascular;  Laterality: N/A;  . PARTIAL HYSTERECTOMY  2009  . TEE WITHOUT CARDIOVERSION N/A 12/12/2017   Procedure: TRANSESOPHAGEAL ECHOCARDIOGRAM (TEE);  Surgeon: Rexene Alberts, MD;  Location: [facility];  Service: Open Heart Surgery;  Laterality: N/A;   Social History   Occupational History  . Not on file  Tobacco Use  . Smoking status: Current Some Day Smoker    Packs/day: 0.75    Years: 35.00  Pack years: 26.25    Types: Cigarettes    Start date: 09/01/1981  . Smokeless tobacco: Never Used  . Tobacco comment: patient has admitted to continued smoking, but is trying to quit  Substance and Sexual Activity  . Alcohol use: No    Frequency: Never  . Drug use: Never  . Sexual activity: Not on file

## 2018-07-25 ENCOUNTER — Encounter (INDEPENDENT_AMBULATORY_CARE_PROVIDER_SITE_OTHER): Payer: Self-pay | Admitting: Orthopaedic Surgery

## 2018-07-29 ENCOUNTER — Ambulatory Visit (INDEPENDENT_AMBULATORY_CARE_PROVIDER_SITE_OTHER): Payer: No Typology Code available for payment source | Admitting: Physician Assistant

## 2018-07-29 ENCOUNTER — Encounter: Payer: Self-pay | Admitting: Physician Assistant

## 2018-07-29 VITALS — BP 113/77 | HR 77 | Temp 97.3°F | Ht 60.0 in | Wt 169.6 lb

## 2018-07-29 DIAGNOSIS — E1065 Type 1 diabetes mellitus with hyperglycemia: Secondary | ICD-10-CM

## 2018-07-29 DIAGNOSIS — I10 Essential (primary) hypertension: Secondary | ICD-10-CM

## 2018-07-29 DIAGNOSIS — M545 Low back pain: Secondary | ICD-10-CM | POA: Diagnosis not present

## 2018-07-29 DIAGNOSIS — G8929 Other chronic pain: Secondary | ICD-10-CM

## 2018-07-29 DIAGNOSIS — E785 Hyperlipidemia, unspecified: Secondary | ICD-10-CM

## 2018-07-29 DIAGNOSIS — R635 Abnormal weight gain: Secondary | ICD-10-CM

## 2018-07-29 MED ORDER — OXYCODONE HCL 10 MG PO TABS: 10.0000 mg | ORAL_TABLET | Freq: Three times a day (TID) | ORAL | 0 refills | Status: DC

## 2018-07-29 MED ORDER — PHENTERMINE HCL 37.5 MG PO CAPS
37.5000 mg | ORAL_CAPSULE | ORAL | 1 refills | Status: DC
Start: 2018-07-29 — End: 2018-11-19

## 2018-07-29 NOTE — Patient Instructions (Signed)
In a few days you may receive a survey in the mail or online from [employer] regarding your visit with us today. Please take a moment to fill this out. Your feedback is very important to our whole office. It can help us better understand your needs as well as improve your experience and satisfaction. Thank you for taking your time to complete it. We care about you.  Chyler Creely, PA-C  

## 2018-07-31 NOTE — Progress Notes (Signed)
BP 113/77   Pulse 77   Temp (!) 97.3 F (36.3 C) (Oral)   Ht 5' (1.524 m)   Wt 169 lb 9.6 oz (76.9 kg)   LMP 04/17/2014   BMI 33.12 kg/m    Subjective:    Patient ID: Melissa Rubio, female    DOB: 1971/01/15, 48 y.o.   MRN: [id number]  HPI: Melissa Rubio is a 48 y.o. female presenting on 07/29/2018 for Back Pain (2 month) and Diabetes  Patient comes in for 71-monthrecheck on her chronic medical conditions that do include back pain with sciatica, diabetes, weight gain, hyperlipidemia, hypertension.  All of her medications are reviewed.  Is still under the care of cardiology and orthopedics.  She has no new issues at this  Past Medical History:  Diagnosis Date  . Anxiety   . Cancer ([facility] 1994   cervical  . Coronary artery disease    2/19 PCI/DES x1 to dRCA with LM, LAD disease (planned for possible CABG in near future), normal EF  . Coronary artery disease involving native coronary artery of native heart with angina pectoris [place]   . Diabetes ([place]   . Hypertension   . Itching   . [place]ocardial infarction ([place]   . RLS (restless legs syndrome)   . 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 Tobacco use    Relevant past medical, surgical, family and social history reviewed and updated as indicated. Interim medical history since our last visit reviewed. Allergies and medications reviewed and updated. DATA REVIEWED: CHART IN EPIC  Family History reviewed for pertinent findings.  Review of Systems  Constitutional: Negative.  Negative for activity change, fatigue and fever.  HENT: Negative.   Eyes: Negative.   Respiratory: Negative.  Negative for cough.   Cardiovascular: Negative.  Negative for chest pain.  Gastrointestinal: Negative.  Negative for abdominal pain.  Endocrine: Negative.   Genitourinary: Negative.  Negative for dysuria.  Musculoskeletal: Positive for arthralgias, gait problem and joint swelling.  Skin: Negative.     Allergies as of 07/29/2018   No Known  Allergies     Medication List       Accurate as of July 29, 2018 11:59 PM. Always use your most recent med list.        acetaminophen 325 MG tablet Commonly known as:  TYLENOL Take 650 mg by mouth 2 (two) times daily as needed for moderate pain or headache.   ALPRAZolam 1 MG tablet Commonly known as:  XANAX Take 0.5 tablets (0.5 mg total) by mouth 3 (three) times daily as needed for anxiety.   aspirin 81 MG chewable tablet Chew 1 tablet (81 mg total) by mouth daily.   busPIRone 15 MG tablet Commonly known as:  BUSPAR Take 1 tablet (15 mg total) by mouth 3 (three) times daily.   carvedilol 3.125 MG tablet Commonly known as:  COREG Take 1 tablet (3.125 mg total) by mouth 2 (two) times daily with a meal.   citalopram 40 MG tablet Commonly known as:  CELEXA Take 1 tablet (40 mg total) by mouth daily.   clopidogrel 75 MG tablet Commonly known as:  PLAVIX Take 1 tablet (75 mg total) by mouth daily.   cyclobenzaprine 10 MG tablet Commonly known as:  FLEXERIL Take 1 tablet (10 mg total) by mouth 3 (three) times daily as needed for muscle spasms.   ezetimibe 10 MG tablet Commonly known as:  ZETIA Take 1 tablet (10 mg  total) by mouth daily.   ferrous sulfate 325 (65 FE) MG tablet Take 1 tablet (325 mg total) by mouth daily with breakfast. For one month then stop.   gabapentin 800 MG tablet Commonly known as:  NEURONTIN Take 1 tablet (800 mg total) by mouth 4 (four) times daily.   hydrOXYzine 50 MG tablet Commonly known as:  ATARAX/VISTARIL Take 1 tablet (50 mg total) by mouth every 8 (eight) hours as needed.   ibuprofen 800 MG tablet Commonly known as:  ADVIL,MOTRIN Take 1 tablet (800 mg total) by mouth every 8 (eight) hours as needed.   ICY HOT EX Apply 1 application topically daily as needed (back pain).   insulin NPH Human 100 UNIT/ML injection Commonly known as:  HUMULIN N,NOVOLIN N Inject 0.4 mLs (40 Units total) 2 (two) times daily before a meal into  the skin.   INSULIN SYRINGE .5CC/29G 29G X 1/2" 0.5 ML Misc 1 Syringe by Does not apply route 2 (two) times daily.   lisinopril 2.5 MG tablet Commonly known as:  PRINIVIL,ZESTRIL Take 1 tablet (2.5 mg total) by mouth daily.   metFORMIN 1000 MG tablet Commonly known as:  GLUCOPHAGE Take 1 tablet (1,000 mg total) by mouth 2 (two) times daily with a meal.   nicotine 21 mg/24hr patch Commonly known as:  NICODERM CQ - dosed in mg/24 hours Place 1 patch (21 mg total) onto the skin daily.   omeprazole 20 MG capsule Commonly known as:  PRILOSEC Take 20 mg by mouth daily.   Oxycodone HCl 10 MG Tabs Take 1 tablet (10 mg total) by mouth 3 (three) times daily. PRN severe pain   Oxycodone HCl 10 MG Tabs Take 1 tablet (10 mg total) by mouth 3 (three) times daily. Prn pain   phentermine 37.5 MG capsule Take 1 capsule (37.5 mg total) by mouth every morning.   rosuvastatin 40 MG tablet Commonly known as:  CRESTOR Take 1 tablet (40 mg total) by mouth daily.   topiramate 100 MG tablet Commonly known as:  TOPAMAX Take 1-1.5 tablets (100-150 mg total) by mouth 2 (two) times daily.          Objective:    BP 113/77   Pulse 77   Temp (!) 97.3 F (36.3 C) (Oral)   Ht 5' (1.524 m)   Wt 169 lb 9.6 oz (76.9 kg)   LMP 04/17/2014   BMI 33.12 kg/m   No Known Allergies  Wt Readings from Last 3 Encounters:  07/29/18 169 lb 9.6 oz (76.9 kg)  07/24/18 166 lb (75.3 kg)  07/17/18 166 lb (75.3 kg)    Physical Exam Constitutional:      Appearance: She is well-developed.  HENT:     Head: Normocephalic and atraumatic.  Eyes:     Conjunctiva/sclera: Conjunctivae normal.     Pupils: Pupils are equal, round, and reactive to light.  Cardiovascular:     Rate and Rhythm: Normal rate and regular rhythm.     Heart sounds: Normal heart sounds.  Pulmonary:     Effort: Pulmonary effort is normal.     Breath sounds: Normal breath sounds.  Abdominal:     General: Bowel sounds are normal.      Palpations: Abdomen is soft.  Musculoskeletal:        General: Swelling and tenderness present.  Skin:    General: Skin is warm and dry.     Findings: No rash.  Neurological:     Mental Status: She is alert and  oriented to person, place, and time.     Deep Tendon Reflexes: Reflexes are normal and symmetric.  Psychiatric:        Behavior: Behavior normal.        Thought Content: Thought content normal.        Judgment: Judgment normal.     Results for orders placed or performed in visit on 03/25/18  CMP14+EGFR  Result Value Ref Range   Glucose 122 (H) 65 - 99 mg/dL   BUN 8 6 - 24 mg/dL   Creatinine, Ser 0.58 0.57 - 1.00 mg/dL   GFR calc non Af Amer 110 >59 mL/min/1.73   GFR calc Af Amer 127 >59 mL/min/1.73   BUN/Creatinine Ratio 14 9 - 23   Sodium 141 134 - 144 mmol/L   Potassium 4.7 3.5 - 5.2 mmol/L   Chloride 103 96 - 106 mmol/L   CO2 17 (L) 20 - 29 mmol/L   Calcium 9.8 8.7 - 10.2 mg/dL   Total Protein 7.7 6.0 - 8.5 g/dL   Albumin 4.5 3.5 - 5.5 g/dL   Globulin, Total 3.2 1.5 - 4.5 g/dL   Albumin/Globulin Ratio 1.4 1.2 - 2.2   Bilirubin Total <0.2 0.0 - 1.2 mg/dL   Alkaline Phosphatase 105 39 - 117 IU/L   AST 13 0 - 40 IU/L   ALT 11 0 - 32 IU/L  CBC with Differential/Platelet  Result Value Ref Range   WBC 9.3 3.4 - 10.8 x10E3/uL   RBC 4.88 3.77 - 5.28 x10E6/uL   Hemoglobin 13.4 11.1 - 15.9 g/dL   Hematocrit 41.4 34.0 - 46.6 %   MCV 85 79 - 97 fL   MCH 27.5 26.6 - 33.0 pg   MCHC 32.4 31.5 - 35.7 g/dL   RDW 16.8 (H) 12.3 - 15.4 %   Platelets 371 150 - 450 x10E3/uL   Neutrophils 62 Not Estab. %   Lymphs 31 Not Estab. %   Monocytes 6 Not Estab. %   Eos 1 Not Estab. %   Basos 0 Not Estab. %   Neutrophils Absolute 5.8 1.4 - 7.0 x10E3/uL   Lymphocytes Absolute 2.9 0.7 - 3.1 x10E3/uL   Monocytes Absolute 0.6 0.1 - 0.9 x10E3/uL   EOS (ABSOLUTE) 0.1 0.0 - 0.4 x10E3/uL   Basophils Absolute 0.0 0.0 - 0.2 x10E3/uL   Immature Granulocytes 0 Not Estab. %   Immature Grans  (Abs) 0.0 0.0 - 0.1 x10E3/uL  Lipid panel  Result Value Ref Range   Cholesterol, Total 225 (H) 100 - 199 mg/dL   Triglycerides 236 (H) 0 - 149 mg/dL   HDL 33 (L) >39 mg/dL   VLDL Cholesterol Cal 47 (H) 5 - 40 mg/dL   LDL Calculated 145 (H) 0 - 99 mg/dL   Chol/HDL Ratio 6.8 (H) 0.0 - 4.4 ratio  TSH  Result Value Ref Range   TSH 0.788 0.450 - 4.500 uIU/mL  Bayer DCA Hb A1c Waived  Result Value Ref Range   HB A1C (BAYER DCA - WAIVED) 7.3 (H) <7.0 %      Assessment & Plan:   1. Type 1 diabetes mellitus with hyperglycemia (HCC) Continue medications  2. Essential hypertension Continue medications  3. Hyperlipidemia with target LDL less than 70 Continue medications  4. Chronic right-sided low back pain without sciatica - Oxycodone HCl 10 MG TABS; Take 1 tablet (10 mg total) by mouth 3 (three) times daily. PRN severe pain  Dispense: 90 tablet; Refill: 0 - Oxycodone HCl 10 MG TABS; Take 1  tablet (10 mg total) by mouth 3 (three) times daily. Prn pain  Dispense: 90 tablet; Refill: 0  5. Weight gain - phentermine 37.5 MG capsule; Take 1 capsule (37.5 mg total) by mouth every morning.  Dispense: 30 capsule; Refill: 1   Continue all other maintenance medications as listed above.  Follow up plan: Return in about 2 months (around 09/27/2018) for recheck.  Educational handout given for [place] PA-C [place] [address]  [place], [place] [address] [phone]   07/31/2018, 8:00 PM

## 2018-08-05 IMAGING — DX DG CHEST 2V
2 series · 2 of 2 positions shown · non-contrast
Comparison: None.

CLINICAL DATA: Acute onset of generalized chest pain.

EXAM:
CHEST - 2 VIEW

[chest pa]
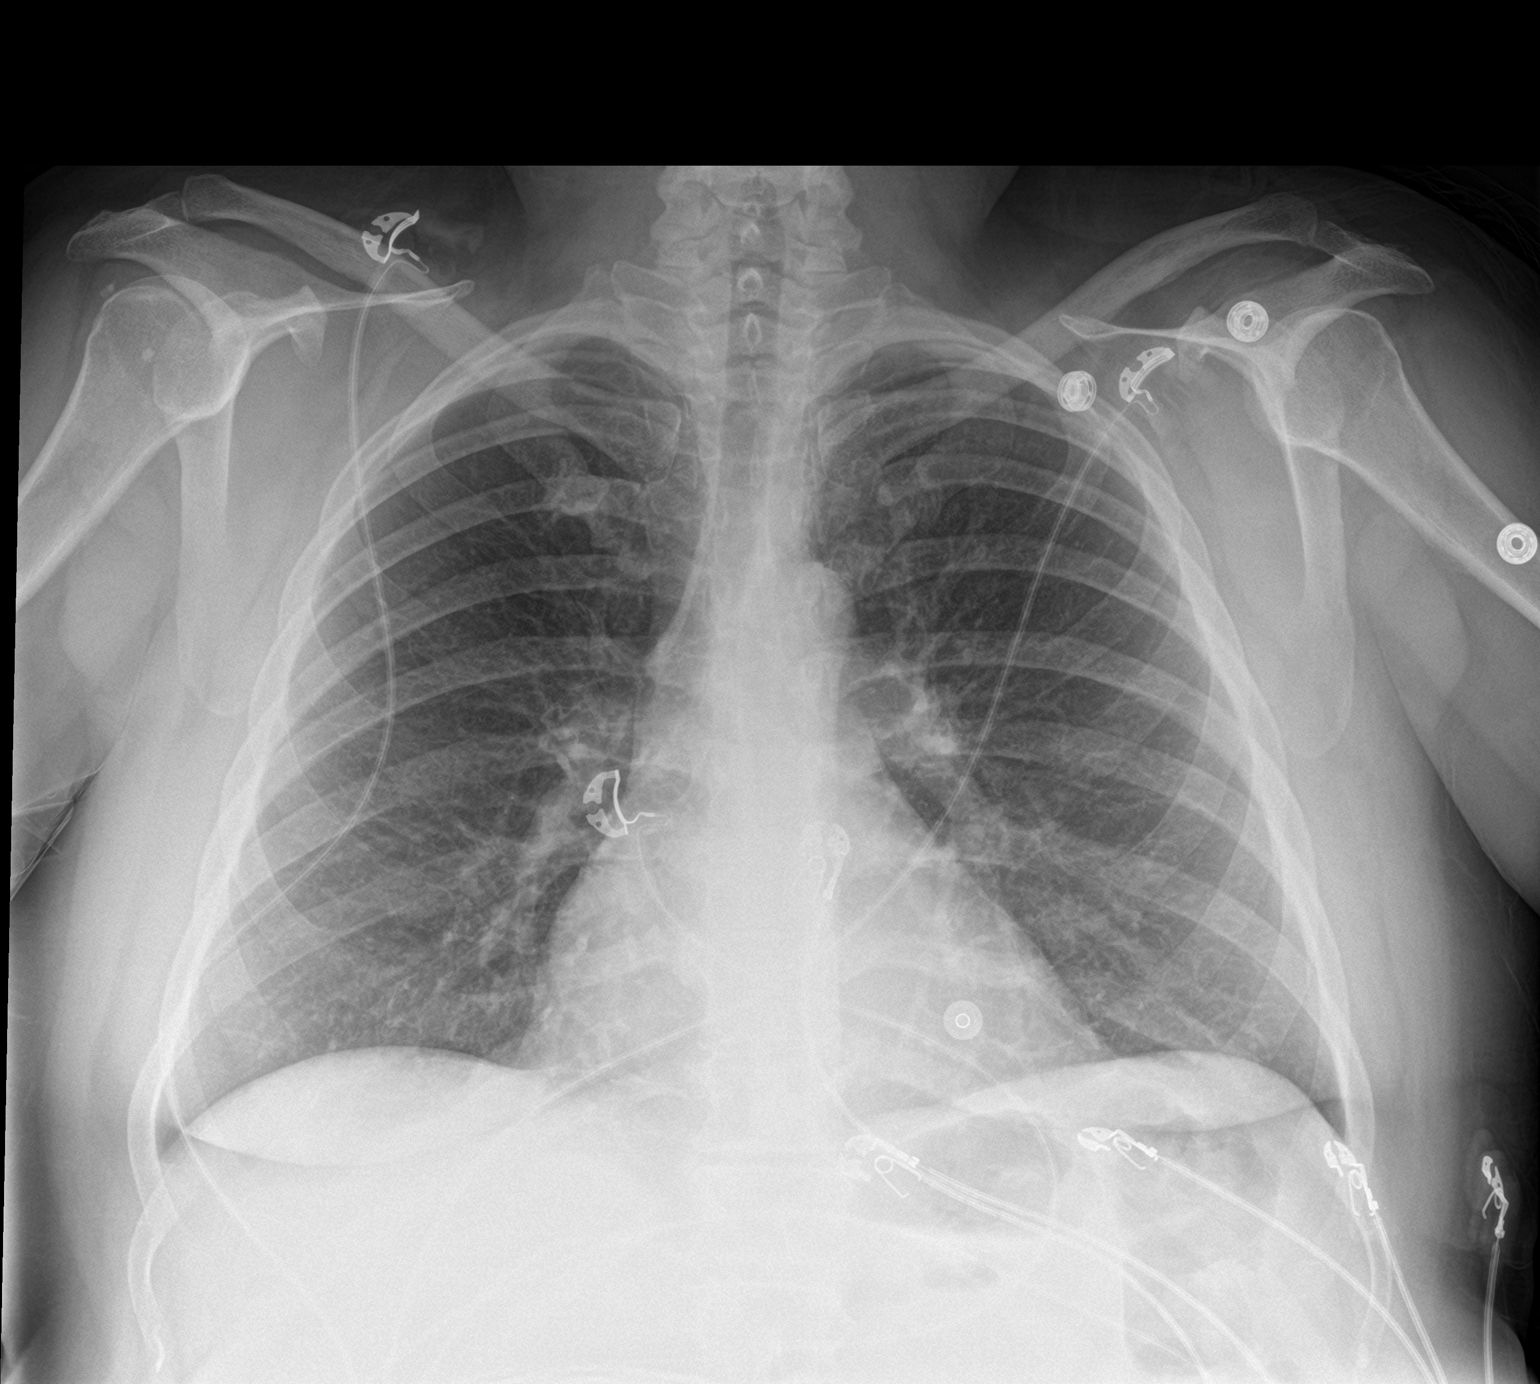

[chest lat]
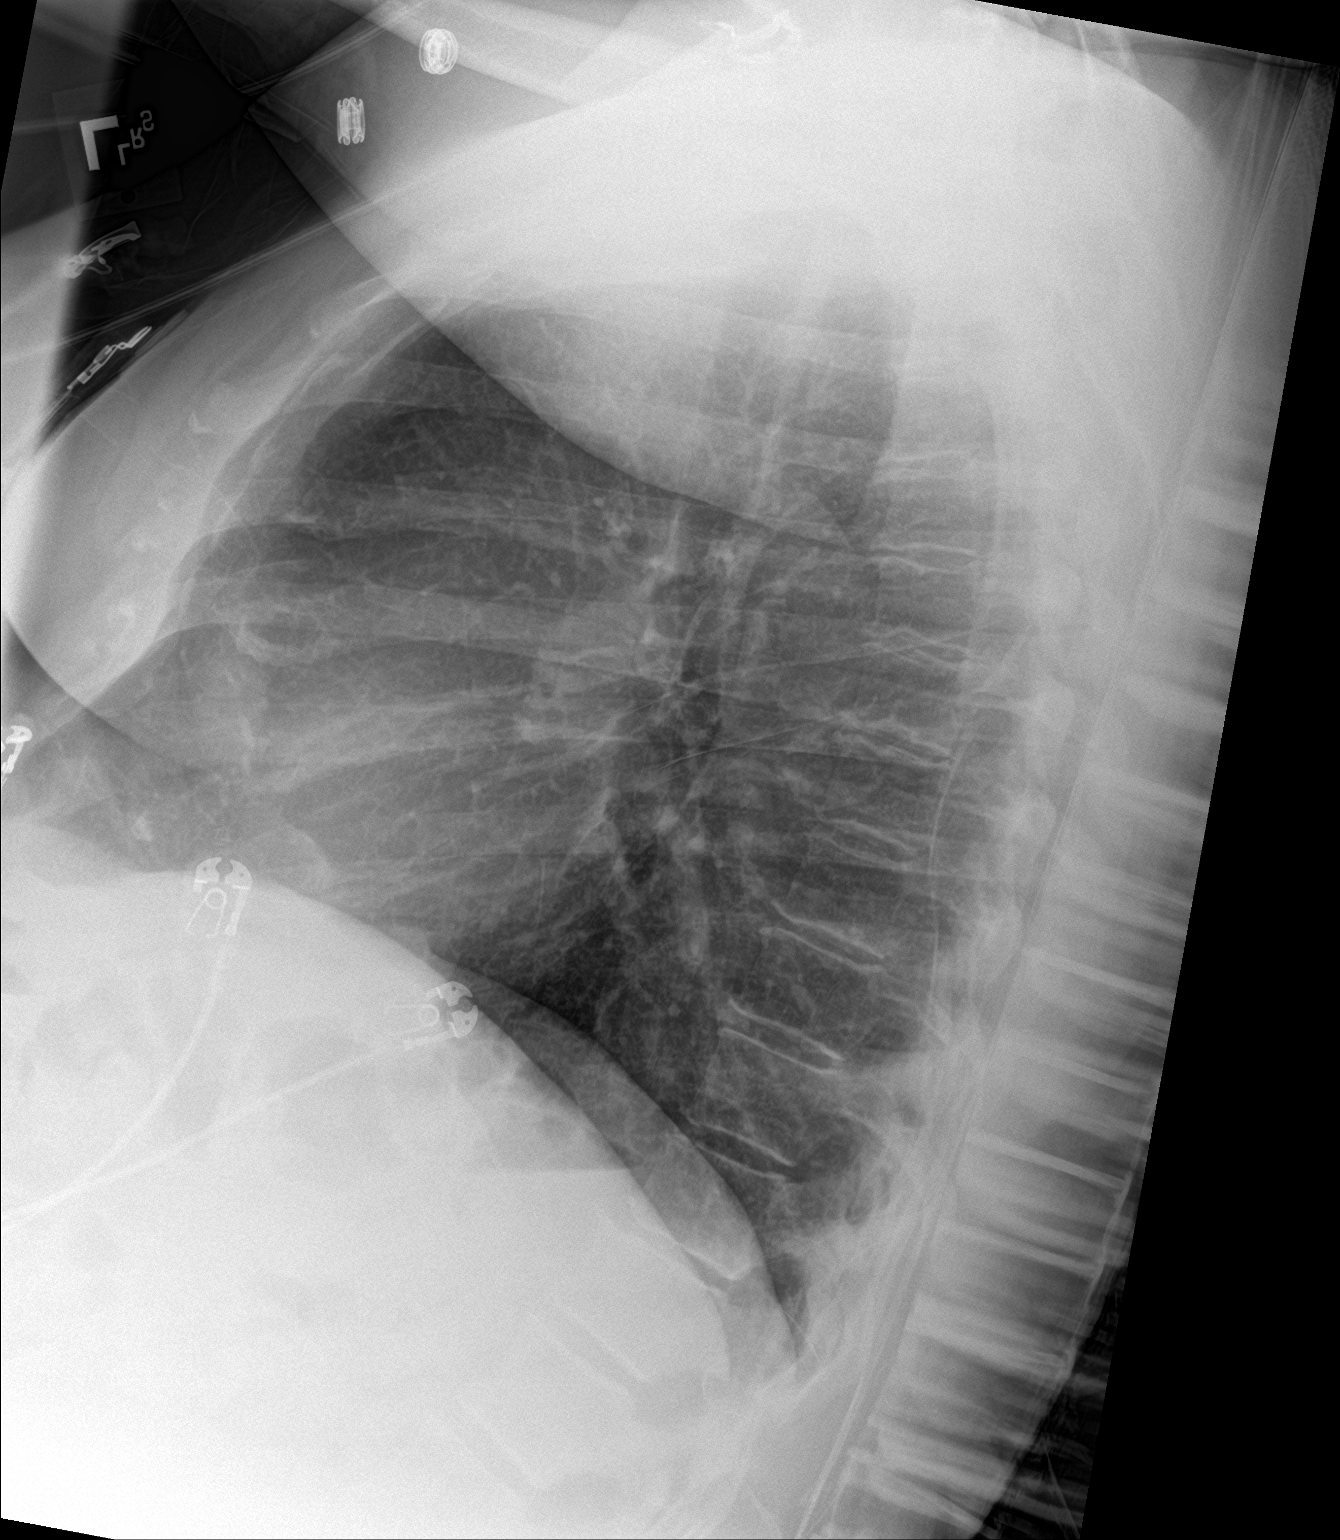

[2 of 2 positions shown; findings below may reference images not displayed]

FINDINGS: The lungs are well-aerated. Mild peribronchial thickening is noted.
There is no evidence of focal opacification, pleural effusion or
pneumothorax.

The heart is normal in size; the mediastinal contour is within
normal limits. No acute osseous abnormalities are seen.
IMPRESSION: Mild peribronchial thickening noted.  Lungs otherwise clear.

## 2018-08-12 ENCOUNTER — Other Ambulatory Visit: Payer: Self-pay | Admitting: Cardiology

## 2018-09-01 ENCOUNTER — Other Ambulatory Visit: Payer: Self-pay | Admitting: Physician Assistant

## 2018-09-01 ENCOUNTER — Ambulatory Visit (INDEPENDENT_AMBULATORY_CARE_PROVIDER_SITE_OTHER): Payer: PRIVATE HEALTH INSURANCE | Admitting: Cardiology

## 2018-09-01 ENCOUNTER — Encounter: Payer: Self-pay | Admitting: Cardiology

## 2018-09-01 VITALS — BP 125/83 | HR 93 | Ht 60.0 in | Wt 169.2 lb

## 2018-09-01 DIAGNOSIS — E782 Mixed hyperlipidemia: Secondary | ICD-10-CM

## 2018-09-01 DIAGNOSIS — I739 Peripheral vascular disease, unspecified: Secondary | ICD-10-CM | POA: Diagnosis not present

## 2018-09-01 DIAGNOSIS — I2510 Atherosclerotic heart disease of native coronary artery without angina pectoris: Secondary | ICD-10-CM

## 2018-09-01 DIAGNOSIS — E785 Hyperlipidemia, unspecified: Secondary | ICD-10-CM

## 2018-09-01 NOTE — Patient Instructions (Addendum)
Medication Instructions:   Your physician has recommended you make the following change in your medication:   Stop plavix  Continue all other medications the same  Labwork:  NONE  Testing/Procedures:  NONE  Follow-Up:  Your physician recommends that you schedule a follow-up appointment in: 6 months. You will receive a reminder letter in the mail in about 4 months reminding you to call and schedule your appointment. If you don't receive this letter, please contact our office.  Any Other Special Instructions Will Be Listed Below (If Applicable).  If you need a refill on your cardiac medications before your next appointment, please call your pharmacy.

## 2018-09-01 NOTE — Progress Notes (Signed)
Clinical Summary Ms. Monts is a 48 y.o.female seen today for follow up of the following medical problems.   1. 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 Full report as indicated below, received DES to RCA. LM 50%, LAD 65%, LCX 80% treated medically.  - 08/2017 echo LVEF 55-60%, inferior hypokinesis - medical therapy limited due to soft bp's during admission.  - referred to CT surgery given remaining LM/LAD and LCX disease - s/p CABG 12/12/17 with LIMA-LAD, SVG OM - changed from brillinta to plavix due to cost - 11/2017 echo LVEF 55-60%, no WMAs  No recent chest pain. No SOB/DOE - compliant with meds   2. PAD - prior to CABG ABI right 0.62, left 1.12 - mainly limited by hip and back pain. Occasional cramping pain right calf/thigh, not limiting.   - no claudication pain. Has chronic back and hip pain.    3. Hyperlipidemia - 03/2018 TC 225 TG 236 HDL 33 LDL 145 -  changed her from atorvatatin to crestor 40mg  at that time. Has been on zetia.     4. Tobacco cessation - just bought nicotine patches.  Past Medical History:  Diagnosis Date  . Anxiety   . Cancer [facility]) 1994   cervical  . Coronary artery disease    2/19 PCI/DES x1 to dRCA with LM, LAD disease (planned for possible CABG in near future), normal EF  . Coronary artery disease involving native coronary artery of native heart with angina pectoris ([place])   . Diabetes ([place])   . Hypertension   . Itching   . Myocardial infarction ([place])   . RLS (restless legs syndrome)   . 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 Tobacco use      No Known Allergies   Current Outpatient Medications  Medication Sig Dispense Refill  . acetaminophen (TYLENOL) 325 MG tablet Take 650 mg by mouth 2 (two) times daily as needed for moderate pain or headache.    . ALPRAZolam (XANAX) 1 MG tablet Take 0.5 tablets (0.5 mg total) by mouth 3 (three) times daily as needed for anxiety. 75 tablet 5  . aspirin 81 MG chewable tablet Chew 1  tablet (81 mg total) by mouth daily.    . busPIRone (BUSPAR) 15 MG tablet Take 1 tablet (15 mg total) by mouth 3 (three) times daily. 90 tablet 11  . carvedilol (COREG) 3.125 MG tablet Take 1 tablet (3.125 mg total) by mouth 2 (two) times daily with a meal. 180 tablet 3  . citalopram (CELEXA) 40 MG tablet Take 1 tablet (40 mg total) by mouth daily. 90 tablet 3  . clopidogrel (PLAVIX) 75 MG tablet Take 1 tablet (75 mg total) by mouth daily. 30 tablet 3  . cyclobenzaprine (FLEXERIL) 10 MG tablet Take 1 tablet (10 mg total) by mouth 3 (three) times daily as needed for muscle spasms. 30 tablet 1  . ezetimibe (ZETIA) 10 MG tablet TAKE 1 TABLET ONCE A DAY 90 tablet 1  . ferrous sulfate 325 (65 FE) MG tablet Take 1 tablet (325 mg total) by mouth daily with breakfast. For one month then stop. 30 tablet 3  . gabapentin (NEURONTIN) 800 MG tablet Take 1 tablet (800 mg total) by mouth 4 (four) times daily. 120 tablet 5  . hydrOXYzine (ATARAX/VISTARIL) 50 MG tablet Take 1 tablet (50 mg total) by mouth every 8 (eight) hours as needed. (Patient taking differently: Take 50 mg by mouth every 8 (eight) hours  as needed for anxiety or itching. ) 270 tablet 3  . ibuprofen (ADVIL,MOTRIN) 800 MG tablet Take 1 tablet (800 mg total) by mouth every 8 (eight) hours as needed. 90 tablet 1  . insulin NPH Human (HUMULIN N,NOVOLIN N) 100 UNIT/ML injection Inject 0.4 mLs (40 Units total) 2 (two) times daily before a meal into the skin. (Patient taking differently: Inject 30-40 Units into the skin See admin instructions. 40 units in the morning and 30 units at night) 30 mL 3  . INSULIN SYRINGE .5CC/29G 29G X 1/2" 0.5 ML MISC 1 Syringe by Does not apply route 2 (two) times daily. 100 each 11  . lisinopril (PRINIVIL,ZESTRIL) 2.5 MG tablet Take 1 tablet (2.5 mg total) by mouth daily. 90 tablet 3  . Menthol, Topical Analgesic, (ICY HOT EX) Apply 1 application topically daily as needed (back pain).    . metFORMIN (GLUCOPHAGE) 1000 MG  tablet Take 1 tablet (1,000 mg total) by mouth 2 (two) times daily with a meal. 60 tablet 11  . nicotine (NICODERM CQ - DOSED IN MG/24 HOURS) 21 mg/24hr patch Place 1 patch (21 mg total) onto the skin daily. 28 patch 0  . omeprazole (PRILOSEC) 20 MG capsule Take 20 mg by mouth daily.    . Oxycodone HCl 10 MG TABS Take 1 tablet (10 mg total) by mouth 3 (three) times daily. PRN severe pain 90 tablet 0  . Oxycodone HCl 10 MG TABS Take 1 tablet (10 mg total) by mouth 3 (three) times daily. Prn pain 90 tablet 0  . phentermine 37.5 MG capsule Take 1 capsule (37.5 mg total) by mouth every morning. 30 capsule 1  . rosuvastatin (CRESTOR) 40 MG tablet Take 1 tablet (40 mg total) by mouth daily. 90 tablet 3  . topiramate (TOPAMAX) 100 MG tablet Take 1-1.5 tablets (100-150 mg total) by mouth 2 (two) times daily. 60 tablet 5   No current facility-administered medications for this visit.      Past Surgical History:  Procedure Laterality Date  . CARDIAC CATHETERIZATION    . CORONARY ARTERY BYPASS GRAFT N/A 12/12/2017   Procedure: MEDIAN STERNOTOMY for CORONARY ARTERY BYPASS GRAFTING (CABG) x 2 (LIMA to LAD, SVG to OM) using RIGHT THIGH GREATER SAPHENOUS VEIN and LEFT INTERNAL MAMMARY ARTERY;  Surgeon: Rexene Alberts, MD;  Location: [place];  Service: Open Heart Surgery;  Laterality: N/A;  . CORONARY STENT INTERVENTION N/A 09/01/2017   Procedure: CORONARY STENT INTERVENTION;  Surgeon: Martinique, Peter M, MD;  Location: [place] [facility];  Service: Cardiovascular;  Laterality: N/A;  . CORONARY/GRAFT ACUTE MI REVASCULARIZATION N/A 09/01/2017   Procedure: Coronary/Graft Acute MI Revascularization;  Surgeon: Martinique, Peter M, MD;  Location: [place] [facility];  Service: Cardiovascular;  Laterality: N/A;  . LEFT HEART CATH AND CORONARY ANGIOGRAPHY N/A 09/01/2017   Procedure: LEFT HEART CATH AND CORONARY ANGIOGRAPHY;  Surgeon: Martinique, Peter M, MD;  Location: [place] [facility];  Service: Cardiovascular;  Laterality: N/A;    . PARTIAL HYSTERECTOMY  2009  . TEE WITHOUT CARDIOVERSION N/A 12/12/2017   Procedure: TRANSESOPHAGEAL ECHOCARDIOGRAM (TEE);  Surgeon: Rexene Alberts, MD;  Location: [place];  Service: Open Heart Surgery;  Laterality: N/A;     No Known Allergies    Family History  Problem Relation Age of Onset  . Cancer Mother   . Heart attack Father   . Diabetes Sister   . Diabetes Brother   . Diabetes Sister   . Heart attack Sister  Social History Ms. Lenzen reports that she has been smoking cigarettes. She started smoking about 37 years ago. She has a 26.25 pack-year smoking history. She has never used smokeless tobacco. Ms. Lavey reports no history of alcohol use.   Review of Systems CONSTITUTIONAL: No weight loss, fever, chills, weakness or fatigue.  HEENT: Eyes: No visual loss, blurred vision, double vision or yellow sclerae.No hearing loss, sneezing, congestion, runny nose or sore throat.  SKIN: No rash or itching.  CARDIOVASCULAR: pe rhpi RESPIRATORY: No shortness of breath, cough or sputum.  GASTROINTESTINAL: No anorexia, nausea, vomiting or diarrhea. No abdominal pain or blood.  GENITOURINARY: No burning on urination, no polyuria NEUROLOGICAL: No headache, dizziness, syncope, paralysis, ataxia, numbness or tingling in the extremities. No change in bowel or bladder control.  MUSCULOSKELETAL: No muscle, back pain, joint pain or stiffness.  LYMPHATICS: No enlarged nodes. No history of splenectomy.  PSYCHIATRIC: No history of depression or anxiety.  ENDOCRINOLOGIC: No reports of sweating, cold or heat intolerance. No polyuria or polydipsia.  Marland Kitchen   Physical Examination Vitals:   09/01/18 1253  BP: 125/83  Pulse: 93  SpO2: 99%   Vitals:   09/01/18 1253  Weight: 169 lb 3.2 oz (76.7 kg)  Height: 5' (1.524 m)    Gen: resting comfortably, no acute distress HEENT: no scleral icterus, pupils equal round and reactive, no palptable cervical adenopathy,  CV: RRR, no m/r/g, no  jvd Resp: Clear to auscultation bilaterally GI: abdomen is soft, non-tender, non-distended, normal bowel sounds, no hepatosplenomegaly MSK: extremities are warm, no edema.  Skin: warm, no rash Neuro:  no focal deficits Psych: appropriate affect   Diagnostic Studies 08/2017 cath  Dist RCA lesion is 100% stenosed.  Prox RCA to Mid RCA lesion is 25% stenosed.  Dist LM-2 lesion is 50% stenosed.  Dist LM to Ost LAD lesion is 65% stenosed.  Ost Cx to Prox Cx lesion is 80% stenosed.  Post intervention, there is a 0% residual stenosis.  A drug-eluting stent was successfully placed using a STENT SYNERGY DES 2.5X32.  The left ventricular systolic function is normal.  LV end diastolic pressure is mildly elevated.  The left ventricular ejection fraction is 55-65% by visual estimate.  1. 3 vessel obstructive CAD. 100% distal RCA is the culprit vessel. She also has 50% distal left main, 65% ostial LAD, and 80% ostial LCx 2. Inferior wall motion abnormality with preserved LV systolic function 3. Mildly elevated LVEDP 4. Successful stenting of the distal RCA with DES  Plan: DAPT for one year. Will need to review films with interventional colleagues to decide management of residual disease in LCA. For now will treat medically. It is not well suited for PCI. Will need to decide whether CABG is needed after she has healed from MI.  Carotid/ABI preCABG [place] Final Interpretation: Right Carotid: Velocities in the right ICA are consistent with a 1-39% stenosis.  Left Carotid: Velocities in the left ICA are consistent with a 1-39% stenosis. Vertebrals: Bilateral vertebral arteries demonstrate antegrade flow.  Right ABI: Resting right ankle-brachial index indicates moderate right lower extremity arterial disease.  Left ABI: Resting left ankle-brachial index is within normal range. No evidence of significant left lower extremity arterial disease.   Right Upper Extremity: Radial Doppler  waveforms decrease >50% with compression. Ulnar Doppler waveforms remain within normal limits with compression.  Left Upper Extremity: Radial and Ulnar Doppler waveforms remain within normal limits with compression.    Assessment and Plan  1. CAD - recent inferior  STEMI, s/p stenting. She had residual LM 50% and ostial LAD 65%, LCX 80%.She  had a CABG for this remaining disease  - one year since stent, we will stop her plavix - continue other medcs  2. PAD - no symptoms - continue medical therapy  3. Hyperlipidemia - will ask pcp to recheck lipids at next appt, if not at goal would need repatha or praluent in place of zetia.       Arnoldo Lenis, M.D.

## 2018-09-30 ENCOUNTER — Ambulatory Visit: Payer: No Typology Code available for payment source | Admitting: Physician Assistant

## 2018-10-03 ENCOUNTER — Other Ambulatory Visit: Payer: Self-pay | Admitting: Family Medicine

## 2018-10-03 DIAGNOSIS — I10 Essential (primary) hypertension: Secondary | ICD-10-CM

## 2018-11-04 ENCOUNTER — Ambulatory Visit: Payer: No Typology Code available for payment source | Admitting: Physician Assistant

## 2018-11-13 ENCOUNTER — Other Ambulatory Visit: Payer: Self-pay | Admitting: Physician Assistant

## 2018-11-13 DIAGNOSIS — G8929 Other chronic pain: Secondary | ICD-10-CM

## 2018-11-13 DIAGNOSIS — M545 Low back pain, unspecified: Secondary | ICD-10-CM

## 2018-11-15 ENCOUNTER — Other Ambulatory Visit: Payer: Self-pay | Admitting: *Deleted

## 2018-11-15 DIAGNOSIS — F419 Anxiety disorder, unspecified: Secondary | ICD-10-CM

## 2018-11-17 ENCOUNTER — Telehealth: Payer: Self-pay | Admitting: Physician Assistant

## 2018-11-17 NOTE — Telephone Encounter (Signed)
appt made

## 2018-11-18 ENCOUNTER — Other Ambulatory Visit: Payer: Self-pay

## 2018-11-19 ENCOUNTER — Ambulatory Visit (INDEPENDENT_AMBULATORY_CARE_PROVIDER_SITE_OTHER): Payer: Self-pay | Admitting: Physician Assistant

## 2018-11-19 ENCOUNTER — Encounter: Payer: Self-pay | Admitting: Physician Assistant

## 2018-11-19 ENCOUNTER — Other Ambulatory Visit: Payer: Self-pay

## 2018-11-19 DIAGNOSIS — M545 Low back pain: Secondary | ICD-10-CM

## 2018-11-19 DIAGNOSIS — G8929 Other chronic pain: Secondary | ICD-10-CM

## 2018-11-19 DIAGNOSIS — F419 Anxiety disorder, unspecified: Secondary | ICD-10-CM

## 2018-11-19 MED ORDER — OXYCODONE HCL 10 MG PO TABS: 10.0000 mg | ORAL_TABLET | Freq: Three times a day (TID) | ORAL | 0 refills | Status: DC

## 2018-11-19 MED ORDER — OXYCODONE HCL 10 MG PO TABS: 10.0000 mg | ORAL_TABLET | Freq: Three times a day (TID) | ORAL | 0 refills | Status: DC | PRN

## 2018-11-19 MED ORDER — NITROGLYCERIN 0.4 MG SL SUBL: 0.4000 mg | SUBLINGUAL_TABLET | SUBLINGUAL | 3 refills | Status: DC | PRN

## 2018-11-19 MED ORDER — ALPRAZOLAM 1 MG PO TABS: 0.5000 mg | ORAL_TABLET | Freq: Three times a day (TID) | ORAL | 5 refills | Status: DC | PRN

## 2018-11-19 MED ORDER — NALOXONE HCL 4 MG/0.1ML NA LIQD: 1.0000 | Freq: Once | NASAL | 1 refills | Status: AC

## 2018-11-19 NOTE — Progress Notes (Signed)
BP 124/84   Pulse 84   Temp 99.1 F (37.3 C) (Oral)   Ht 5' (1.524 m)   Wt 166 lb 6.4 oz (75.5 kg)   LMP 04/17/2014   BMI 32.50 kg/m    Subjective:    Patient ID: Melissa Rubio, female    DOB: September 25, 1970, 48 y.o.   MRN: [id number]  HPI: Melissa Rubio is a 48 y.o. female presenting on 11/19/2018 for Pain and Diabetes  She has been doing well with her blood pressure, and diabetes. Denies any severe elevation or hypoglycemia events.  She is due to have labs soon.  Her nitroglycerin does need refilling.  It is out of date.  She does have a follow-up with cardiology in coming weeks.  She is not sure if she will go into the office or do it by telephone.   PAIN ASSESSMENT: Cause of pain- DDD lumbar 2020 CT: L4-5 disc bulge broad.  There is bilateral stenosis.  Was performed through [facility].  This patient returns for a 3 month recheck on narcotic use for the above named conditions  Current medications-Flexeril 10 mg Neurontin 800 mg Oxycodone 10 mg 1 up to 3 times a day for severe pain  Medication side effects- no Any concerns- no  Pain on scale of 1-10- 7 Frequency- daily What increases pain-ending What makes pain Better-rest and medication Effects on ADL -mild Any change in general medical condition-no  Effectiveness of current meds-good Adverse reactions form pain meds-no PMP AWARE website reviewed: Yes Any suspicious activity on PMP Aware: No MME daily dose: 45  Last UDS  11/19/18 Contract 04/30/2019 [facility] script sent or current  History of overdose or risk of abuse no . ANXIETY ASSESSMENT Cause of anxiety: GAD This patient returns for a  month recheck on narcotic use for the above named condition(s)  Current medications-Celexa 40 mg Atarax 50 mg up to 3 times a day BuSpar 15 mg 3 times a day Alprazolam 1 mg 1/2-1 up to 3 times a day for severe anxiety  Medication side effects- no Any concerns- no Any change in general medical condition- no Effectiveness of  current meds- good Discussed working te alprazolam down to maximum of 2 daily over the next 3 months.  PMP AWARE website reviewed: Yes Any suspicious activity on PMP Aware: No LME daily dose: 3    Past Medical History:  Diagnosis Date  . Anxiety   . Cancer [facility]) 1994   cervical  . Coronary artery disease    2/19 PCI/DES x1 to dRCA with LM, LAD disease (planned for possible CABG in near future), normal EF  . Coronary artery disease involving native coronary artery of native heart with angina pectoris ([place])   . Diabetes ([place])   . Hypertension   . Itching   . Myocardial infarction ([place])   . RLS (restless legs syndrome)   . 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 Tobacco use    Relevant past medical, surgical, family and social history reviewed and updated as indicated. Interim medical history since our last visit reviewed. Allergies and medications reviewed and updated. DATA REVIEWED: CHART IN EPIC  Family History reviewed for pertinent findings.  Review of Systems  Constitutional: Negative.   HENT: Negative.   Eyes: Negative.   Respiratory: Negative.   Gastrointestinal: Negative.   Genitourinary: Negative.     Allergies as of 11/19/2018   No Known Allergies     Medication List  Accurate as of Nov 19, 2018 11:59 PM. If you have any questions, ask your nurse or doctor.        STOP taking these medications   acetaminophen 325 MG tablet Commonly known as:  TYLENOL Stopped by:  Terald Sleeper, PA-C   nicotine 21 mg/24hr patch Commonly known as:  NICODERM CQ - dosed in mg/24 hours Stopped by:  Terald Sleeper, PA-C   phentermine 37.5 MG capsule Stopped by:  Terald Sleeper, PA-C   topiramate 100 MG tablet Commonly known as:  Topamax Stopped by:  Terald Sleeper, PA-C     TAKE these medications   ALPRAZolam 1 MG tablet Commonly known as:  XANAX Take 0.5 tablets (0.5 mg total) by mouth 3 (three) times daily as needed for anxiety.   aspirin 81 MG  chewable tablet Chew 1 tablet (81 mg total) by mouth daily.   busPIRone 15 MG tablet Commonly known as:  BUSPAR Take 1 tablet (15 mg total) by mouth 3 (three) times daily.   carvedilol 3.125 MG tablet Commonly known as:  COREG Take 1 tablet (3.125 mg total) by mouth 2 (two) times daily with a meal.   citalopram 40 MG tablet Commonly known as:  CELEXA Take 1 tablet (40 mg total) by mouth daily.   cyclobenzaprine 10 MG tablet Commonly known as:  FLEXERIL Take 1 tablet (10 mg total) by mouth 3 (three) times daily as needed for muscle spasms.   ezetimibe 10 MG tablet Commonly known as:  ZETIA TAKE 1 TABLET ONCE A DAY   ferrous sulfate 325 (65 FE) MG tablet Take 1 tablet (325 mg total) by mouth daily with breakfast. For one month then stop.   gabapentin 800 MG tablet Commonly known as:  Neurontin Take 1 tablet (800 mg total) by mouth 4 (four) times daily.   hydrOXYzine 50 MG tablet Commonly known as:  ATARAX/VISTARIL Take 1 tablet (50 mg total) by mouth every 8 (eight) hours as needed. What changed:  reasons to take this   ibuprofen 800 MG tablet Commonly known as:  ADVIL Take 1 tablet (800 mg total) by mouth every 8 (eight) hours as needed.   ICY HOT EX Apply 1 application topically daily as needed (back pain).   insulin NPH Human 100 UNIT/ML injection Commonly known as:  NOVOLIN N Inject 0.4 mLs (40 Units total) 2 (two) times daily before a meal into the skin. What changed:    how much to take  when to take this  additional instructions   INSULIN SYRINGE .5CC/29G 29G X 1/2" 0.5 ML Misc 1 Syringe by Does not apply route 2 (two) times daily.   lisinopril 2.5 MG tablet Commonly known as:  ZESTRIL Take 1 tablet (2.5 mg total) by mouth daily.   metFORMIN 1000 MG tablet Commonly known as:  GLUCOPHAGE Take 1 tablet (1,000 mg total) by mouth 2 (two) times daily with a meal.   naloxone 4 MG/0.1ML Liqd nasal spray kit Commonly known as:  NARCAN Place 1 spray into  the nose once for 1 dose. Started by:  Terald Sleeper, PA-C   nitroGLYCERIN 0.4 MG SL tablet Commonly known as:  NITROSTAT Place 1 tablet (0.4 mg total) under the tongue every 5 (five) minutes as needed for chest pain. Started by:  Terald Sleeper, PA-C   omeprazole 20 MG capsule Commonly known as:  PRILOSEC Take 20 mg by mouth daily.   Oxycodone HCl 10 MG Tabs Take 1 tablet (10 mg total)  by mouth 3 (three) times daily. PRN severe pain What changed:  Another medication with the same name was added. Make sure you understand how and when to take each. Changed by:  Terald Sleeper, PA-C   Oxycodone HCl 10 MG Tabs Take 1 tablet (10 mg total) by mouth 3 (three) times daily. Prn pain What changed:  Another medication with the same name was added. Make sure you understand how and when to take each. Changed by:  Terald Sleeper, PA-C   Oxycodone HCl 10 MG Tabs Take 1 tablet (10 mg total) by mouth 3 (three) times daily as needed. What changed:  You were already taking a medication with the same name, and this prescription was added. Make sure you understand how and when to take each. Changed by:  Terald Sleeper, PA-C   rosuvastatin 40 MG tablet Commonly known as:  CRESTOR Take 1 tablet (40 mg total) by mouth daily.          Objective:    BP 124/84   Pulse 84   Temp 99.1 F (37.3 C) (Oral)   Ht 5' (1.524 m)   Wt 166 lb 6.4 oz (75.5 kg)   LMP 04/17/2014   BMI 32.50 kg/m   No Known Allergies  Wt Readings from Last 3 Encounters:  11/19/18 166 lb 6.4 oz (75.5 kg)  09/01/18 169 lb 3.2 oz (76.7 kg)  07/29/18 169 lb 9.6 oz (76.9 kg)    Physical Exam Constitutional:      Appearance: She is well-developed.  HENT:     Head: Normocephalic and atraumatic.  Eyes:     Conjunctiva/sclera: Conjunctivae normal.     Pupils: Pupils are equal, round, and reactive to light.  Cardiovascular:     Rate and Rhythm: Normal rate and regular rhythm.     Heart sounds: Normal heart sounds.   Pulmonary:     Effort: Pulmonary effort is normal.     Breath sounds: Normal breath sounds.  Abdominal:     General: Bowel sounds are normal.     Palpations: Abdomen is soft.  Skin:    General: Skin is warm and dry.     Findings: No rash.  Neurological:     Mental Status: She is alert and oriented to person, place, and time.     Deep Tendon Reflexes: Reflexes are normal and symmetric.  Psychiatric:        Behavior: Behavior normal.        Thought Content: Thought content normal.        Judgment: Judgment normal.     Results for orders placed or performed in visit on 11/19/18  ToxASSURE Select 77 (MW), Urine  Result Value Ref Range   Summary FINAL       Assessment & Plan:   1. Anxiety - ToxASSURE Select 13 (MW), Urine - ALPRAZolam (XANAX) 1 MG tablet; Take 0.5 tablets (0.5 mg total) by mouth 3 (three) times daily as needed for anxiety.  Dispense: 60 tablet; Refill: 5  2. Chronic right-sided low back pain without sciatica - ToxASSURE Select 13 (MW), Urine - Oxycodone HCl 10 MG TABS; Take 1 tablet (10 mg total) by mouth 3 (three) times daily. PRN severe pain  Dispense: 90 tablet; Refill: 0 - Oxycodone HCl 10 MG TABS; Take 1 tablet (10 mg total) by mouth 3 (three) times daily. Prn pain  Dispense: 90 tablet; Refill: 0   Continue all other maintenance medications as listed above.  Follow up plan: Return  in about 3 months (around 02/19/2019) for recheck.  Educational handout given for [place] PA-C [facility] [address]  [place], [place] [address] [phone]   11/24/2018, 7:34 PM

## 2018-11-23 LAB — TOXASSURE SELECT 13 (MW), URINE

## 2018-11-24 ENCOUNTER — Encounter: Payer: Self-pay | Admitting: Physician Assistant

## 2018-11-25 NOTE — Progress Notes (Signed)
Recommend delineating what end goal is for dosing of Xanax/ Oxycodone.  Is plan to taper fully, reduce, etc?  Where is she in the taper of the medications.  Recommend coming off both if possible. Would gradually taper the xanax by 1/2 tablet every 3-4 weeks until off.  Agree with SSRI and Buspar.  Lauretta Sallas M. Lajuana Ripple, [facility]

## 2018-12-08 ENCOUNTER — Ambulatory Visit: Payer: No Typology Code available for payment source | Admitting: Physician Assistant

## 2018-12-15 ENCOUNTER — Encounter: Payer: PRIVATE HEALTH INSURANCE | Admitting: Thoracic Surgery (Cardiothoracic Vascular Surgery)

## 2018-12-22 ENCOUNTER — Telehealth (INDEPENDENT_AMBULATORY_CARE_PROVIDER_SITE_OTHER): Payer: PRIVATE HEALTH INSURANCE | Admitting: Thoracic Surgery (Cardiothoracic Vascular Surgery)

## 2018-12-22 ENCOUNTER — Other Ambulatory Visit: Payer: Self-pay

## 2018-12-22 DIAGNOSIS — Z951 Presence of aortocoronary bypass graft: Secondary | ICD-10-CM | POA: Diagnosis not present

## 2018-12-22 NOTE — Progress Notes (Signed)
[address]       [place] [address]             [phone]     CARDIOTHORACIC SURGERY TELEPHONE VIRTUAL OFFICE NOTE  Primary Cardiologist is Carlyle Dolly, MD PCP is Terald Sleeper, PA-C   HPI:  I spoke with Melissa Rubio (DOB 1970-08-29 ) via telephone on 12/22/2018 at 10:04 AM and verified that I was speaking with the correct person using more than one form of identification.  We discussed the reason(s) for conducting our visit virtually instead of in-person.  The patient expressed understanding the circumstances and agreed to proceed as described.   Patient is 48 year old obese female with history of coronary artery disease, hypertension, type 2 diabetes mellitus with complications, hyperlipidemia, longstanding tobacco abuse, and chronic back pain with chronic opioid use who underwent coronary artery bypass grafting x2 on December 12, 2017 for 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  She was last seen here in our office on February 14, 2018 at which time she was doing fairly well and remained stable from a cardiac standpoint.  Since then she has been seen in follow-up by Dr. Harl Bowie.  We telephoned the patient today for routine one-year follow-up visit.  The patient states that she was seen briefly at [facility] in [place] approximately 6 months ago for a transient episode of chest pain.  She states that she was told that she did not have a heart attack but that she may have had angina.  Catheterization was not performed at that time.  Since then she has remained clinically stable.  She states that she tries to walk some but she is limited by chronic back pain and pain in her hips.  She has not been having any exertional chest pain or chest tightness.  She states that she gets short of breath with more strenuous exertion but her breathing does not limit her.  She does get fatigued.  She states that she checks her blood sugars on a  regular basis and she has not had problems with glycemic control.  She still has problems with chronic pain in her back.  She did report numbness along her sternotomy incision and occasional "twinge" of sharp pain in her chest if she reaches suddenly or tries to lift something heavy with her arms.  She denies any recent sensation of clicking or motion in her sternum.   Current Outpatient Medications  Medication Sig Dispense Refill  . ALPRAZolam (XANAX) 1 MG tablet Take 0.5 tablets (0.5 mg total) by mouth 3 (three) times daily as needed for anxiety. 60 tablet 5  . aspirin 81 MG chewable tablet Chew 1 tablet (81 mg total) by mouth daily.    . busPIRone (BUSPAR) 15 MG tablet Take 1 tablet (15 mg total) by mouth 3 (three) times daily. 90 tablet 11  . carvedilol (COREG) 3.125 MG tablet Take 1 tablet (3.125 mg total) by mouth 2 (two) times daily with a meal. 180 tablet 3  . citalopram (CELEXA) 40 MG tablet Take 1 tablet (40 mg total) by mouth daily. 90 tablet 3  . cyclobenzaprine (FLEXERIL) 10 MG tablet Take 1 tablet (10 mg total) by mouth 3 (three) times daily as needed for muscle spasms. 30 tablet 1  . ezetimibe (ZETIA) 10 MG tablet TAKE 1 TABLET ONCE A DAY 90 tablet 1  . ferrous sulfate 325 (65 FE) MG tablet Take 1 tablet (325 mg total) by  mouth daily with breakfast. For one month then stop. 30 tablet 3  . gabapentin (NEURONTIN) 800 MG tablet Take 1 tablet (800 mg total) by mouth 4 (four) times daily. 120 tablet 5  . hydrOXYzine (ATARAX/VISTARIL) 50 MG tablet Take 1 tablet (50 mg total) by mouth every 8 (eight) hours as needed. (Patient taking differently: Take 50 mg by mouth every 8 (eight) hours as needed for anxiety or itching. ) 270 tablet 3  . ibuprofen (ADVIL,MOTRIN) 800 MG tablet Take 1 tablet (800 mg total) by mouth every 8 (eight) hours as needed. 90 tablet 1  . insulin NPH Human (HUMULIN N,NOVOLIN N) 100 UNIT/ML injection Inject 0.4 mLs (40 Units total) 2 (two) times daily before a meal into  the skin. (Patient taking differently: Inject 30-40 Units into the skin See admin instructions. 40 units in the morning and 30 units at night) 30 mL 3  . INSULIN SYRINGE .5CC/29G 29G X 1/2" 0.5 ML MISC 1 Syringe by Does not apply route 2 (two) times daily. 100 each 11  . lisinopril (PRINIVIL,ZESTRIL) 2.5 MG tablet Take 1 tablet (2.5 mg total) by mouth daily. 90 tablet 3  . Menthol, Topical Analgesic, (ICY HOT EX) Apply 1 application topically daily as needed (back pain).    . metFORMIN (GLUCOPHAGE) 1000 MG tablet Take 1 tablet (1,000 mg total) by mouth 2 (two) times daily with a meal. 60 tablet 11  . nitroGLYCERIN (NITROSTAT) 0.4 MG SL tablet Place 1 tablet (0.4 mg total) under the tongue every 5 (five) minutes as needed for chest pain. 50 tablet 3  . omeprazole (PRILOSEC) 20 MG capsule Take 20 mg by mouth daily.    . Oxycodone HCl 10 MG TABS Take 1 tablet (10 mg total) by mouth 3 (three) times daily. PRN severe pain 90 tablet 0  . Oxycodone HCl 10 MG TABS Take 1 tablet (10 mg total) by mouth 3 (three) times daily. Prn pain 90 tablet 0  . Oxycodone HCl 10 MG TABS Take 1 tablet (10 mg total) by mouth 3 (three) times daily as needed. 90 tablet 0  . rosuvastatin (CRESTOR) 40 MG tablet Take 1 tablet (40 mg total) by mouth daily. 90 tablet 3   No current facility-administered medications for this visit.      Diagnostic Tests:  n/a   Impression:  Patient remains clinically stable from a cardiac standpoint approximately 1 year status post coronary artery bypass grafting  Plan:  We have not recommended any changes in the patient's current medications.  The patient was advised regarding the many benefits of regular exercise, heart healthy diet, and tight glycemic control with respect to management of diabetes.  All of her questions have been addressed.  In the future she will call and return to see [place] only should specific problems or questions arise.    I discussed limitations of evaluation and  management via telephone.  The patient was advised to call back for repeat telephone consultation or to seek an in-person evaluation if questions arise or the patient's clinical condition changes in any significant manner.  I spent in excess of 15 minutes of non-face-to-face time during the conduct of this telephone virtual office consultation.     Valentina Gu. Roxy Manns, MD 12/22/2018 10:04 AM

## 2018-12-22 NOTE — Patient Instructions (Signed)
Continue all previous medications without any changes at this time  Make every effort to keep your diabetes under very tight control.  Follow up closely with your primary care physician or endocrinologist and strive to keep their hemoglobin A1c levels as low as possible, preferably near or below 6.0.  The long term benefits of strict control of diabetes are far reaching and critically important for your overall health and survival.  Make every effort to stay physically active, get some type of exercise on a regular basis, and stick to a "heart healthy diet".  The long term benefits for regular exercise and a healthy diet are critically important to your overall health and wellbeing.   

## 2019-02-27 ENCOUNTER — Other Ambulatory Visit: Payer: Self-pay

## 2019-03-02 ENCOUNTER — Encounter: Payer: Self-pay | Admitting: Physician Assistant

## 2019-03-02 ENCOUNTER — Ambulatory Visit (INDEPENDENT_AMBULATORY_CARE_PROVIDER_SITE_OTHER): Payer: Self-pay | Admitting: Physician Assistant

## 2019-03-02 ENCOUNTER — Other Ambulatory Visit: Payer: Self-pay

## 2019-03-02 VITALS — BP 130/78 | HR 84 | Temp 97.3°F | Ht 60.0 in | Wt 165.8 lb

## 2019-03-02 DIAGNOSIS — M545 Low back pain, unspecified: Secondary | ICD-10-CM

## 2019-03-02 DIAGNOSIS — G894 Chronic pain syndrome: Secondary | ICD-10-CM

## 2019-03-02 DIAGNOSIS — I25119 Atherosclerotic heart disease of native coronary artery with unspecified angina pectoris: Secondary | ICD-10-CM

## 2019-03-02 DIAGNOSIS — G8929 Other chronic pain: Secondary | ICD-10-CM

## 2019-03-02 DIAGNOSIS — F419 Anxiety disorder, unspecified: Secondary | ICD-10-CM

## 2019-03-02 DIAGNOSIS — E1065 Type 1 diabetes mellitus with hyperglycemia: Secondary | ICD-10-CM

## 2019-03-02 MED ORDER — CYCLOBENZAPRINE HCL 10 MG PO TABS: 10.0000 mg | ORAL_TABLET | Freq: Three times a day (TID) | ORAL | 3 refills | Status: DC | PRN

## 2019-03-02 MED ORDER — OXYCODONE HCL 10 MG PO TABS: 10.0000 mg | ORAL_TABLET | Freq: Three times a day (TID) | ORAL | 0 refills | Status: AC | PRN

## 2019-03-02 MED ORDER — GABAPENTIN 800 MG PO TABS: 800.0000 mg | ORAL_TABLET | Freq: Four times a day (QID) | ORAL | 5 refills | Status: DC

## 2019-03-02 MED ORDER — IBUPROFEN 800 MG PO TABS: 800.0000 mg | ORAL_TABLET | Freq: Three times a day (TID) | ORAL | 1 refills | Status: DC | PRN

## 2019-03-02 MED ORDER — ROSUVASTATIN CALCIUM 40 MG PO TABS: 40.0000 mg | ORAL_TABLET | Freq: Every day | ORAL | 2 refills | Status: DC

## 2019-03-02 MED ORDER — BUSPIRONE HCL 30 MG PO TABS: 30.0000 mg | ORAL_TABLET | Freq: Three times a day (TID) | ORAL | 5 refills | Status: DC

## 2019-03-02 MED ORDER — ALPRAZOLAM 1 MG PO TABS: 0.5000 mg | ORAL_TABLET | Freq: Three times a day (TID) | ORAL | 5 refills | Status: DC | PRN

## 2019-03-02 NOTE — Progress Notes (Signed)
BP 130/78   Pulse 84   Temp (!) 97.3 F (36.3 C) (Temporal)   Ht 5' (1.524 m)   Wt 165 lb 12.8 oz (75.2 kg)   LMP 04/17/2014   BMI 32.38 kg/m    Subjective:    Patient ID: Melissa Rubio, female    DOB: [birth year], 48 y.o.   MRN: [id number]  HPI: Melissa Rubio is a 48 y.o. female presenting on 03/02/2019 for Diabetes and Pain  This patient comes in for chronic recheck on her medical conditions.  They do include diabetes, CAD, chronic pain from degenerative disc disease.  All of her medications are reviewed today.  We will have labs performed to see how her sugar control has been.  She states she does feel pretty good and does not have any significant issues with her sugars.  The patient is trying to stretch out her alprazolam for as little as possible.  In reviewing the PDMP she did end up going 2 months at a time and getting refills.  PAIN ASSESSMENT: Cause of pain- DDD lumbar 2020 CT: L4-5 disc bulge broad.  There is bilateral stenosis.  Was performed through [facility].  This patient returns for a 3 month recheck on narcotic use for the above named conditions  Current medications-Flexeril 10 mg Neurontin 800 mg Oxycodone 10 mg 1 up to 3 times a day for severe pain  Medication side effects- no Any concerns- no  Pain on scale of 1-10- 7 Frequency- daily What increases pain-ending What makes pain Better-rest and medication Effects on ADL -mild Any change in general medical condition-no  Effectiveness of current meds-good Adverse reactions form pain meds-no PMP AWARE website reviewed: Yes Any suspicious activity on PMP Aware: No MME daily dose: 45  Last UDS  11/19/18 Contract 04/30/2019 [facility] script sent or current  History of overdose or risk of abuse no . ANXIETY ASSESSMENT Cause of anxiety: GAD This patient returns for a  month recheck on narcotic use for the above named condition(s)  Current medications-Celexa 40 mg Atarax 50 mg up to 3 times a day BuSpar  15 mg 3 times a day    Past Medical History:  Diagnosis Date  . Anxiety   . Cancer [facility]) 1994   cervical  . Coronary artery disease    2/19 PCI/DES x1 to dRCA with LM, LAD disease (planned for possible CABG in near future), normal EF  . Coronary artery disease involving native coronary artery of native heart with angina pectoris ([place])   . Diabetes ([place])   . Hypertension   . Itching   . Myocardial infarction ([place])   . RLS (restless legs syndrome)   . 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 Tobacco use    Relevant past medical, surgical, family and social history reviewed and updated as indicated. Interim medical history since our last visit reviewed. Allergies and medications reviewed and updated. DATA REVIEWED: CHART IN EPIC  Family History reviewed for pertinent findings.  Review of Systems  Constitutional: Negative.   HENT: Negative.   Eyes: Negative.   Respiratory: Negative.   Gastrointestinal: Negative.   Genitourinary: Negative.   Musculoskeletal: Positive for arthralgias, back pain and myalgias.    Allergies as of 03/02/2019   No Known Allergies     Medication List       Accurate as of March 02, 2019 11:59 PM. If you have any questions, ask your nurse or doctor.  ALPRAZolam 1 MG tablet Commonly known as: XANAX Take 0.5 tablets (0.5 mg total) by mouth 3 (three) times daily as needed for anxiety.   aspirin 81 MG chewable tablet Chew 1 tablet (81 mg total) by mouth daily.   busPIRone 30 MG tablet Commonly known as: BUSPAR Take 1 tablet (30 mg total) by mouth 3 (three) times daily. What changed:   medication strength  how much to take Changed by: Melissa Sleeper, PA-C   carvedilol 3.125 MG tablet Commonly known as: COREG Take 1 tablet (3.125 mg total) by mouth 2 (two) times daily with a meal.   citalopram 40 MG tablet Commonly known as: CELEXA Take 1 tablet (40 mg total) by mouth daily.   cyclobenzaprine 10 MG tablet Commonly  known as: FLEXERIL Take 1 tablet (10 mg total) by mouth 3 (three) times daily as needed for muscle spasms.   ezetimibe 10 MG tablet Commonly known as: ZETIA TAKE 1 TABLET ONCE A DAY   ferrous sulfate 325 (65 FE) MG tablet Take 1 tablet (325 mg total) by mouth daily with breakfast. For one month then stop.   gabapentin 800 MG tablet Commonly known as: Neurontin Take 1 tablet (800 mg total) by mouth 4 (four) times daily.   hydrOXYzine 50 MG tablet Commonly known as: ATARAX/VISTARIL Take 1 tablet (50 mg total) by mouth every 8 (eight) hours as needed. What changed: reasons to take this   ibuprofen 800 MG tablet Commonly known as: ADVIL Take 1 tablet (800 mg total) by mouth every 8 (eight) hours as needed.   ICY HOT EX Apply 1 application topically daily as needed (back pain).   insulin NPH Human 100 UNIT/ML injection Commonly known as: NOVOLIN N Inject 0.4 mLs (40 Units total) 2 (two) times daily before a meal into the skin. What changed:   how much to take  when to take this  additional instructions   INSULIN SYRINGE .5CC/29G 29G X 1/2" 0.5 ML Misc 1 Syringe by Does not apply route 2 (two) times daily.   lisinopril 2.5 MG tablet Commonly known as: ZESTRIL Take 1 tablet (2.5 mg total) by mouth daily.   metFORMIN 1000 MG tablet Commonly known as: GLUCOPHAGE Take 1 tablet (1,000 mg total) by mouth 2 (two) times daily with a meal.   nitroGLYCERIN 0.4 MG SL tablet Commonly known as: NITROSTAT Place 1 tablet (0.4 mg total) under the tongue every 5 (five) minutes as needed for chest pain.   omeprazole 20 MG capsule Commonly known as: PRILOSEC Take 20 mg by mouth daily.   Oxycodone HCl 10 MG Tabs Take 1 tablet (10 mg total) by mouth 3 (three) times daily as needed. What changed:   when to take this  reasons to take this  additional instructions Changed by: Melissa Sleeper, PA-C   Oxycodone HCl 10 MG Tabs Take 1 tablet (10 mg total) by mouth 3 (three) times  daily as needed. What changed:   when to take this  reasons to take this  additional instructions Changed by: Melissa Sleeper, PA-C   Oxycodone HCl 10 MG Tabs Take 1 tablet (10 mg total) by mouth 3 (three) times daily as needed. What changed: Another medication with the same name was changed. Make sure you understand how and when to take each. Changed by: Melissa Sleeper, PA-C   rosuvastatin 40 MG tablet Commonly known as: CRESTOR Take 1 tablet (40 mg total) by mouth daily.  Objective:    BP 130/78   Pulse 84   Temp (!) 97.3 F (36.3 C) (Temporal)   Ht 5' (1.524 m)   Wt 165 lb 12.8 oz (75.2 kg)   LMP 04/17/2014   BMI 32.38 kg/m   No Known Allergies  Wt Readings from Last 3 Encounters:  03/02/19 165 lb 12.8 oz (75.2 kg)  11/19/18 166 lb 6.4 oz (75.5 kg)  09/01/18 169 lb 3.2 oz (76.7 kg)    Physical Exam Constitutional:      General: She is not in acute distress.    Appearance: Normal appearance. She is well-developed.  HENT:     Head: Normocephalic and atraumatic.  Cardiovascular:     Rate and Rhythm: Normal rate.  Pulmonary:     Effort: Pulmonary effort is normal.  Skin:    General: Skin is warm and dry.     Findings: No rash.  Neurological:     Mental Status: She is alert and oriented to person, place, and time.     Deep Tendon Reflexes: Reflexes are normal and symmetric.     Results for orders placed or performed in visit on 11/19/18  ToxASSURE Select 65 (MW), Urine  Result Value Ref Range   Summary FINAL       Assessment & Plan:   1. Chronic right-sided low back pain without sciatica - Oxycodone HCl 10 MG TABS; Take 1 tablet (10 mg total) by mouth 3 (three) times daily as needed.  Dispense: 60 tablet; Refill: 0 - Oxycodone HCl 10 MG TABS; Take 1 tablet (10 mg total) by mouth 3 (three) times daily as needed.  Dispense: 60 tablet; Refill: 0 - Oxycodone HCl 10 MG TABS; Take 1 tablet (10 mg total) by mouth 3 (three) times daily as needed.   Dispense: 60 tablet; Refill: 0 - cyclobenzaprine (FLEXERIL) 10 MG tablet; Take 1 tablet (10 mg total) by mouth 3 (three) times daily as needed for muscle spasms.  Dispense: 60 tablet; Refill: 3 - gabapentin (NEURONTIN) 800 MG tablet; Take 1 tablet (800 mg total) by mouth 4 (four) times daily.  Dispense: 120 tablet; Refill: 5 - ibuprofen (ADVIL) 800 MG tablet; Take 1 tablet (800 mg total) by mouth every 8 (eight) hours as needed.  Dispense: 90 tablet; Refill: 1  2. Anxiety - busPIRone (BUSPAR) 30 MG tablet; Take 1 tablet (30 mg total) by mouth 3 (three) times daily.  Dispense: 90 tablet; Refill: 5 - ALPRAZolam (XANAX) 1 MG tablet; Take 0.5 tablets (0.5 mg total) by mouth 3 (three) times daily as needed for anxiety.  Dispense: 75 tablet; Refill: 5 Reduce the quantity, will continue at future visits.  3. Coronary artery disease involving native coronary artery of native heart with angina pectoris (HCC) - rosuvastatin (CRESTOR) 40 MG tablet; Take 1 tablet (40 mg total) by mouth daily.  Dispense: 90 tablet; Refill: 2  4. Type 1 diabetes mellitus with hyperglycemia (HCC) - rosuvastatin (CRESTOR) 40 MG tablet; Take 1 tablet (40 mg total) by mouth daily.  Dispense: 90 tablet; Refill: 2  5. Chronic pain syndrome - gabapentin (NEURONTIN) 800 MG tablet; Take 1 tablet (800 mg total) by mouth 4 (four) times daily.  Dispense: 120 tablet; Refill: 5 - ibuprofen (ADVIL) 800 MG tablet; Take 1 tablet (800 mg total) by mouth every 8 (eight) hours as needed.  Dispense: 90 tablet; Refill: 1   Continue all other maintenance medications as listed above.  Follow up plan: Return in about 3 months (around 06/02/2019).  Educational  handout given for [place] PA-C [place] [address]  [place], [place] [address] [phone]   03/08/2019, 4:22 PM

## 2019-03-08 DIAGNOSIS — G894 Chronic pain syndrome: Secondary | ICD-10-CM | POA: Insufficient documentation

## 2019-04-10 ENCOUNTER — Encounter: Payer: Self-pay | Admitting: Nurse Practitioner

## 2019-04-10 ENCOUNTER — Ambulatory Visit (INDEPENDENT_AMBULATORY_CARE_PROVIDER_SITE_OTHER): Payer: Self-pay | Admitting: Nurse Practitioner

## 2019-04-10 DIAGNOSIS — J00 Acute nasopharyngitis [common cold]: Secondary | ICD-10-CM

## 2019-04-10 DIAGNOSIS — N3000 Acute cystitis without hematuria: Secondary | ICD-10-CM

## 2019-04-10 MED ORDER — CIPROFLOXACIN HCL 500 MG PO TABS: 500.0000 mg | ORAL_TABLET | Freq: Two times a day (BID) | ORAL | 0 refills | Status: DC

## 2019-04-10 NOTE — Progress Notes (Signed)
   Virtual Visit via telephone Note Due to COVID-19 pandemic this visit was conducted virtually. This visit type was conducted due to national recommendations for restrictions regarding the COVID-19 Pandemic (e.g. social distancing, sheltering in place) in an effort to limit this patient's exposure and mitigate transmission in our community. All issues noted in this document were discussed and addressed.  A physical exam was not performed with this format.  I connected with Melissa Rubio on 04/10/19 at 11:30 by telephone and verified that I am speaking with the correct person using two identifiers. Melissa Rubio is currently located at home and no one is currently with her during visit. The provider, Mary-Margaret Hassell Done, FNP is located in their office at time of visit.  I discussed the limitations, risks, security and privacy concerns of performing an evaluation and management service by telephone and the availability of in person appointments. I also discussed with the patient that there may be a patient responsible charge related to this service. The patient expressed understanding and agreed to proceed.   History and Present Illness:   Chief Complaint: Urinary Tract Infection   HPI patient calls in with 2 complaints:  - has had congestion and sneezing that started yesterday. She has had open heart surgery and did not know what she can take for it. No fever. - dysuria that started yesterday. Has had some urgency and frequency, that started yesterday.  Has been using AZO with no relief.   Review of Systems  Constitutional: Negative for chills and fever.  HENT: Positive for congestion. Negative for sinus pain and sore throat.   Respiratory: Negative for cough.   Genitourinary: Positive for dysuria, frequency and urgency.  Neurological: Negative for dizziness and headaches.  Psychiatric/Behavioral: Negative.   All other systems reviewed and are negative.    Observations/Objective: Alert  and oriented- answers all questions appropriately No distress    Assessment and Plan: Melissa Rubio in today with chief complaint of Urinary Tract Infection   1. Acute nasopharyngitis flonase nasal spray OTC Continue daily claritin Force fluids rest  2. Acute cystitis without hematuria Take medication as prescribe Cotton underwear Take shower not bath Cranberry juice, yogurt Force fluids AZO over the counter X2 days RTO prn  - ciprofloxacin (CIPRO) 500 MG tablet; Take 1 tablet (500 mg total) by mouth 2 (two) times daily.  Dispense: 10 tablet; Refill: 0   Follow Up Instructions: prn    I discussed the assessment and treatment plan with the patient. The patient was provided an opportunity to ask questions and all were answered. The patient agreed with the plan and demonstrated an understanding of the instructions.   The patient was advised to call back or seek an in-person evaluation if the symptoms worsen or if the condition fails to improve as anticipated.  The above assessment and management plan was discussed with the patient. The patient verbalized understanding of and has agreed to the management plan. Patient is aware to call the clinic if symptoms persist or worsen. Patient is aware when to return to the clinic for a follow-up visit. Patient educated on when it is appropriate to go to the emergency department.   Time call ended:  11:40  I provided 10 minutes of non-face-to-face time during this encounter.    Mary-Margaret Hassell Done, FNP

## 2019-05-01 ENCOUNTER — Telehealth: Payer: Self-pay | Admitting: Physician Assistant

## 2019-05-01 NOTE — Telephone Encounter (Signed)
Left VM we do not refill abx if having a problem she needs to be seen

## 2019-05-27 ENCOUNTER — Encounter: Payer: Self-pay | Admitting: Physician Assistant

## 2019-05-27 ENCOUNTER — Ambulatory Visit (INDEPENDENT_AMBULATORY_CARE_PROVIDER_SITE_OTHER): Payer: Self-pay | Admitting: Physician Assistant

## 2019-05-27 DIAGNOSIS — F419 Anxiety disorder, unspecified: Secondary | ICD-10-CM

## 2019-05-27 DIAGNOSIS — E1165 Type 2 diabetes mellitus with hyperglycemia: Secondary | ICD-10-CM

## 2019-05-27 DIAGNOSIS — J440 Chronic obstructive pulmonary disease with acute lower respiratory infection: Secondary | ICD-10-CM

## 2019-05-27 DIAGNOSIS — G8929 Other chronic pain: Secondary | ICD-10-CM

## 2019-05-27 DIAGNOSIS — M545 Low back pain: Secondary | ICD-10-CM

## 2019-05-27 DIAGNOSIS — G894 Chronic pain syndrome: Secondary | ICD-10-CM

## 2019-05-27 DIAGNOSIS — I10 Essential (primary) hypertension: Secondary | ICD-10-CM

## 2019-05-27 DIAGNOSIS — J209 Acute bronchitis, unspecified: Secondary | ICD-10-CM

## 2019-05-27 MED ORDER — ALPRAZOLAM 1 MG PO TABS: 0.5000 mg | ORAL_TABLET | Freq: Three times a day (TID) | ORAL | 5 refills | Status: DC | PRN

## 2019-05-27 MED ORDER — LISINOPRIL 2.5 MG PO TABS: 2.5000 mg | ORAL_TABLET | Freq: Every day | ORAL | 3 refills | Status: DC

## 2019-05-27 MED ORDER — IBUPROFEN 800 MG PO TABS: 800.0000 mg | ORAL_TABLET | Freq: Three times a day (TID) | ORAL | 1 refills | Status: DC | PRN

## 2019-05-27 MED ORDER — AZITHROMYCIN 250 MG PO TABS: ORAL_TABLET | ORAL | 0 refills | Status: DC

## 2019-05-27 MED ORDER — METFORMIN HCL 1000 MG PO TABS: 1000.0000 mg | ORAL_TABLET | Freq: Two times a day (BID) | ORAL | 11 refills | Status: DC

## 2019-05-27 NOTE — Progress Notes (Signed)
Telephone visit  Subjective: CC: Recheck on chronic medical conditions PCP: Terald Sleeper, PA-C NAT:[id number] Gracey is a 48 y.o. female calls for telephone consult today. Patient provides verbal consent for consult held via phone.  Patient is identified with 2 separate identifiers.  At this time the entire area is on COVID-19 social distancing and stay home orders are in place.  Patient is of higher risk and therefore we are performing this by a virtual method.  Location of patient: Home Location of provider: HOME Others present for call: No  Patient is having a phone visit because she is having some respiratory symptoms.  She frequently gets bronchitis.  She states that she has been fighting it for about a week.  It is been more in her head and now is in her chest.  She does have a productive cough at times.  She denies any fever.  She does not have any COVID-19 exposure.  Patient with several days of progressing upper respiratory and bronchial symptoms. Initially there was more upper respiratory congestion. This progressed to having significant cough that is productive throughout the day and severe at night. There is occasional wheezing after coughing. Sometimes there is slight dyspnea on exertion. It is productive mucus that is yellow in color. Denies any blood.  PAIN MEDICATION: We have given a titration schedule for the oxycodone that she has on hand.  She had recently picked her bottle up.  And the PDMP does show this.  So we are having her go to twice a day for the couple of weeks and then once a day and then discontinue.  At this time the patient wants to continue her alprazolam.  We had lowered her to 0.5 mg 3 times a day from 4 times a day.  I   n the past few months she has lost her husband suddenly in a tractor-trailer accident.  He was a Chief Executive Officer and there was an accident where he was burned in the truck.  She was obviously very distraught and upset about it.  She  does have family that stays with her.  She is without any insurance at this time and just quite anxious over where things are going to come from.  She already does take BuSpar 30 mg and citalopram 40 mg.  She had come to me on those and has been on them for quite some time.  ANXIETY ASSESSMENT Cause of anxiety: GAD This patient returns for a  month recheck on narcotic use for the above named condition(s)  Current medications-alprazolam 0.5 mg (1/2 tablet of 1 mg, backorder on 0.5 mg) 1 3 times a day as needed for anxiety Buspirone 30 mg Citalopram 40 mg 1 daily  Other medications tried: Multiple SSRIs and SNRIs. Medication side effects-none Any concerns-no Any change in general medical condition-no Effectiveness of current meds- good PMP AWARE website reviewed: YES Any suspicious activity on PMP Aware: No LME daily dose: 3  At the next visit we will continue to talk about starting to lower.  But due to the significant life situation she has going on we will not change this.  However the patient is discontinuing her oxycodone with this prescription.  Contract on file 04/29/18, will update at next visit Last UDS  11/19/2018  History of overdose or risk of abuse no   ROS: Per HPI  No Known Allergies Past Medical History:  Diagnosis Date  . Anxiety   . Cancer ([place]) 1994  cervical  . Coronary artery disease    2/19 PCI/DES x1 to dRCA with LM, LAD disease (planned for possible CABG in near future), normal EF  . Coronary artery disease involving native coronary artery of native heart with angina pectoris ([place])   . Diabetes ([place])   . Hypertension   . Itching   . Myocardial infarction ([place])   . RLS (restless legs syndrome)   . 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 Tobacco use     Current Outpatient Medications:  .  ALPRAZolam (XANAX) 1 MG tablet, Take 0.5 tablets (0.5 mg total) by mouth 3 (three) times daily as needed for anxiety., Disp: 60 tablet, Rfl: 5 .   aspirin 81 MG chewable tablet, Chew 1 tablet (81 mg total) by mouth daily., Disp: , Rfl:  .  azithromycin (ZITHROMAX Z-PAK) 250 MG tablet, Take as directed, Disp: 6 each, Rfl: 0 .  busPIRone (BUSPAR) 30 MG tablet, Take 1 tablet (30 mg total) by mouth 3 (three) times daily., Disp: 90 tablet, Rfl: 5 .  carvedilol (COREG) 3.125 MG tablet, Take 1 tablet (3.125 mg total) by mouth 2 (two) times daily with a meal., Disp: 180 tablet, Rfl: 3 .  citalopram (CELEXA) 40 MG tablet, Take 1 tablet (40 mg total) by mouth daily., Disp: 90 tablet, Rfl: 3 .  cyclobenzaprine (FLEXERIL) 10 MG tablet, Take 1 tablet (10 mg total) by mouth 3 (three) times daily as needed for muscle spasms., Disp: 60 tablet, Rfl: 3 .  ezetimibe (ZETIA) 10 MG tablet, TAKE 1 TABLET ONCE A DAY, Disp: 90 tablet, Rfl: 1 .  ferrous sulfate 325 (65 FE) MG tablet, Take 1 tablet (325 mg total) by mouth daily with breakfast. For one month then stop., Disp: 30 tablet, Rfl: 3 .  gabapentin (NEURONTIN) 800 MG tablet, Take 1 tablet (800 mg total) by mouth 4 (four) times daily., Disp: 120 tablet, Rfl: 5 .  hydrOXYzine (ATARAX/VISTARIL) 50 MG tablet, Take 1 tablet (50 mg total) by mouth every 8 (eight) hours as needed. (Patient taking differently: Take 50 mg by mouth every 8 (eight) hours as needed for anxiety or itching. ), Disp: 270 tablet, Rfl: 3 .  ibuprofen (ADVIL) 800 MG tablet, Take 1 tablet (800 mg total) by mouth every 8 (eight) hours as needed., Disp: 90 tablet, Rfl: 1 .  insulin NPH Human (HUMULIN N,NOVOLIN N) 100 UNIT/ML injection, Inject 0.4 mLs (40 Units total) 2 (two) times daily before a meal into the skin. (Patient taking differently: Inject 30-40 Units into the skin See admin instructions. 40 units in the morning and 30 units at night), Disp: 30 mL, Rfl: 3 .  INSULIN SYRINGE .5CC/29G 29G X 1/2" 0.5 ML MISC, 1 Syringe by Does not apply route 2 (two) times daily., Disp: 100 each, Rfl: 11 .  lisinopril (ZESTRIL) 2.5 MG tablet, Take 1 tablet (2.5  mg total) by mouth daily., Disp: 90 tablet, Rfl: 3 .  metFORMIN (GLUCOPHAGE) 1000 MG tablet, Take 1 tablet (1,000 mg total) by mouth 2 (two) times daily with a meal., Disp: 60 tablet, Rfl: 11 .  nitroGLYCERIN (NITROSTAT) 0.4 MG SL tablet, Place 1 tablet (0.4 mg total) under the tongue every 5 (five) minutes as needed for chest pain., Disp: 50 tablet, Rfl: 3 .  omeprazole (PRILOSEC) 20 MG capsule, Take 20 mg by mouth daily., Disp: , Rfl:  .  rosuvastatin (CRESTOR) 40 MG tablet, Take 1 tablet (40 mg total) by mouth daily.,  Disp: 90 tablet, Rfl: 2  Assessment/ Plan: 48 y.o. female   1. Anxiety - ALPRAZolam (XANAX) 1 MG tablet; Take 0.5 tablets (0.5 mg total) by mouth 3 (three) times daily as needed for anxiety.  Dispense: 60 tablet; Refill: 5  2. Chronic right-sided low back pain without sciatica - ibuprofen (ADVIL) 800 MG tablet; Take 1 tablet (800 mg total) by mouth every 8 (eight) hours as needed.  Dispense: 90 tablet; Refill: 1 Tapering off oxycodone  3. Chronic pain syndrome - ibuprofen (ADVIL) 800 MG tablet; Take 1 tablet (800 mg total) by mouth every 8 (eight) hours as needed.  Dispense: 90 tablet; Refill: 1 Tapering off oxycodone  4. Type 2 diabetes mellitus with hyperglycemia, without long-term current use of insulin (HCC) - metFORMIN (GLUCOPHAGE) 1000 MG tablet; Take 1 tablet (1,000 mg total) by mouth 2 (two) times daily with a meal.  Dispense: 60 tablet; Refill: 11 - CMP14+EGFR; Future - Lipid Panel; Future - hgba1c; Future  5. Essential hypertension - lisinopril (ZESTRIL) 2.5 MG tablet; Take 1 tablet (2.5 mg total) by mouth daily.  Dispense: 90 tablet; Refill: 3 - CMP14+EGFR; Future - Lipid Panel; Future - hgba1c; Future  6. Acute bronchitis with COPD ([place]) - azithromycin (ZITHROMAX Z-PAK) 250 MG tablet; Take as directed  Dispense: 6 each; Refill: 0    No follow-ups on file.  Continue all other maintenance medications as listed above.  Start time: 11:57 AM End time:  12:11 PM  Meds ordered this encounter  Medications  . ALPRAZolam (XANAX) 1 MG tablet    Sig: Take 0.5 tablets (0.5 mg total) by mouth 3 (three) times daily as needed for anxiety.    Dispense:  60 tablet    Refill:  5    Order Specific Question:   Supervising Provider    Answer:   Janora Norlander [[id number]]  . ibuprofen (ADVIL) 800 MG tablet    Sig: Take 1 tablet (800 mg total) by mouth every 8 (eight) hours as needed.    Dispense:  90 tablet    Refill:  1    Order Specific Question:   Supervising Provider    Answer:   Janora Norlander [[id number]]  . lisinopril (ZESTRIL) 2.5 MG tablet    Sig: Take 1 tablet (2.5 mg total) by mouth daily.    Dispense:  90 tablet    Refill:  3    04/02/18 NEW    Order Specific Question:   Supervising Provider    Answer:   Janora Norlander [[id number]]  . metFORMIN (GLUCOPHAGE) 1000 MG tablet    Sig: Take 1 tablet (1,000 mg total) by mouth 2 (two) times daily with a meal.    Dispense:  60 tablet    Refill:  11    Order Specific Question:   Supervising Provider    Answer:   Janora Norlander [[id number]]  . azithromycin (ZITHROMAX Z-PAK) 250 MG tablet    Sig: Take as directed    Dispense:  6 each    Refill:  0    Order Specific Question:   Supervising Provider    Answer:   Janora Norlander [[id number]]    Particia Nearing PA-C [facility] [phone]

## 2019-06-02 ENCOUNTER — Ambulatory Visit: Payer: Self-pay | Admitting: Physician Assistant

## 2019-06-24 ENCOUNTER — Other Ambulatory Visit: Payer: Self-pay | Admitting: Cardiology

## 2019-06-24 DIAGNOSIS — I25119 Atherosclerotic heart disease of native coronary artery with unspecified angina pectoris: Secondary | ICD-10-CM

## 2019-06-24 DIAGNOSIS — E1065 Type 1 diabetes mellitus with hyperglycemia: Secondary | ICD-10-CM

## 2019-10-10 ENCOUNTER — Other Ambulatory Visit: Payer: Self-pay | Admitting: Cardiology

## 2019-10-10 DIAGNOSIS — I25119 Atherosclerotic heart disease of native coronary artery with unspecified angina pectoris: Secondary | ICD-10-CM

## 2019-10-10 DIAGNOSIS — E1065 Type 1 diabetes mellitus with hyperglycemia: Secondary | ICD-10-CM

## 2019-10-15 ENCOUNTER — Other Ambulatory Visit: Payer: Self-pay | Admitting: *Deleted

## 2019-10-15 DIAGNOSIS — G8929 Other chronic pain: Secondary | ICD-10-CM

## 2019-10-26 ENCOUNTER — Telehealth: Payer: Self-pay | Admitting: Cardiology

## 2019-10-26 NOTE — Telephone Encounter (Signed)
From a cardiac standpoint she was doing well at our last visit, don't have a justifiable reason to exclude jury duty unless she is having issues with recent symptoms and in that case would need an appt  Zandra Abts MD

## 2019-10-26 NOTE — Telephone Encounter (Signed)
Patient informed and verbalized understanding

## 2019-10-26 NOTE — Telephone Encounter (Signed)
Asking for a letter to excuse her from having to do [employer].

## 2019-11-06 ENCOUNTER — Telehealth: Payer: Self-pay | Admitting: Cardiology

## 2019-11-06 NOTE — Telephone Encounter (Signed)
  Patient Consent for Virtual Visit         Melissa Rubio has provided verbal consent on 11/06/2019 for a virtual visit (video or telephone).   CONSENT FOR VIRTUAL VISIT FOR:  Melissa Rubio  By participating in this virtual visit I agree to the following:  I hereby voluntarily request, consent and authorize [facility] and its employed or contracted physicians, physician assistants, nurse practitioners or other licensed health care professionals (the Practitioner), to provide me with telemedicine health care services (the "Services") as deemed necessary by the treating Practitioner. I acknowledge and consent to receive the Services by the Practitioner via telemedicine. I understand that the telemedicine visit will involve communicating with the Practitioner through live audiovisual communication technology and the disclosure of certain medical information by electronic transmission. I acknowledge that I have been given the opportunity to request an in-person assessment or other available alternative prior to the telemedicine visit and am voluntarily participating in the telemedicine visit.  I understand that I have the right to withhold or withdraw my consent to the use of telemedicine in the course of my care at any time, without affecting my right to future care or treatment, and that the Practitioner or I may terminate the telemedicine visit at any time. I understand that I have the right to inspect all information obtained and/or recorded in the course of the telemedicine visit and may receive copies of available information for a reasonable fee.  I understand that some of the potential risks of receiving the Services via telemedicine include:  Marland Kitchen Delay or interruption in medical evaluation due to technological equipment failure or disruption; . Information transmitted may not be sufficient (e.g. poor resolution of images) to allow for appropriate medical decision making by the Practitioner; and/or    . In rare instances, security protocols could fail, causing a breach of personal health information.  Furthermore, I acknowledge that it is my responsibility to provide information about my medical history, conditions and care that is complete and accurate to the best of my ability. I acknowledge that Practitioner's advice, recommendations, and/or decision may be based on factors not within their control, such as incomplete or inaccurate data provided by me or distortions of diagnostic images or specimens that may result from electronic transmissions. I understand that the practice of medicine is not an exact science and that Practitioner makes no warranties or guarantees regarding treatment outcomes. I acknowledge that a copy of this consent can be made available to me via my patient portal ([facility]), or I can request a printed copy by calling the office of [facility].    I understand that my insurance will be billed for this visit.   I have read or had this consent read to me. . I understand the contents of this consent, which adequately explains the benefits and risks of the Services being provided via telemedicine.  . I have been provided ample opportunity to ask questions regarding this consent and the Services and have had my questions answered to my satisfaction. . I give my informed consent for the services to be provided through the use of telemedicine in my medical care

## 2019-11-11 ENCOUNTER — Encounter: Payer: Self-pay | Admitting: Cardiology

## 2019-11-11 ENCOUNTER — Telehealth: Payer: Self-pay | Admitting: Cardiology

## 2019-11-11 ENCOUNTER — Telehealth (INDEPENDENT_AMBULATORY_CARE_PROVIDER_SITE_OTHER): Payer: PRIVATE HEALTH INSURANCE | Admitting: Cardiology

## 2019-11-11 ENCOUNTER — Encounter: Payer: Self-pay | Admitting: *Deleted

## 2019-11-11 VITALS — Ht 60.0 in | Wt 167.0 lb

## 2019-11-11 DIAGNOSIS — R0789 Other chest pain: Secondary | ICD-10-CM

## 2019-11-11 DIAGNOSIS — E782 Mixed hyperlipidemia: Secondary | ICD-10-CM

## 2019-11-11 DIAGNOSIS — R079 Chest pain, unspecified: Secondary | ICD-10-CM

## 2019-11-11 DIAGNOSIS — I2510 Atherosclerotic heart disease of native coronary artery without angina pectoris: Secondary | ICD-10-CM

## 2019-11-11 NOTE — Patient Instructions (Addendum)
Your physician recommends that you schedule a follow-up appointment in: [place] physician recommends that you continue on your current medications as directed. Please refer to the Current Medication list given to you today.  Your physician has requested that you have a lexiscan myoview. For further information please visit [email or web address]. Please follow instruction sheet, as given.  Your physician recommends that you return for lab work CBC/BMP/HGBA1C/LIPIDS/MG/TSH - PLEASE FAST 6-8 HOURS PRIOR TO LAB WORK - [facility] AT [facility]   Thank you for choosing [facility]!!

## 2019-11-11 NOTE — Telephone Encounter (Signed)
  Precert needed for: lexiscan  Location:   [facility]  Date: Nov 25, 2019

## 2019-11-11 NOTE — Addendum Note (Signed)
Addended by: Julian Hy T on: 11/11/2019 03:46 PM   Modules accepted: Orders

## 2019-11-11 NOTE — Progress Notes (Signed)
Virtual Visit via Telephone Note   This visit type was conducted due to national recommendations for restrictions regarding the COVID-19 Pandemic (e.g. social distancing) in an effort to limit this patient's exposure and mitigate transmission in our community.  Due to her co-morbid illnesses, this patient is at least at moderate risk for complications without adequate follow up.  This format is felt to be most appropriate for this patient at this time.  The patient did not have access to video technology/had technical difficulties with video requiring transitioning to audio format only (telephone).  All issues noted in this document were discussed and addressed.  No physical exam could be performed with this format.  Please refer to the patient's chart for her  consent to telehealth for [facility].   The patient was identified using 2 identifiers.  Date:  11/11/2019   ID:  [name], DOB [birth year], MRN [id number]  Patient Location: Home Provider Location: Office  PCP:  Terald Sleeper, PA-C  Cardiologist:  Carlyle Dolly, MD  Electrophysiologist:  None   Evaluation Performed:  Follow-Up Visit  Chief Complaint:  Follow up visit  History of Present Illness:    Melissa Rubio is a 49 y.o. female seen today for follow up of the following medical problems.  1. 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 Full report as indicated below, received DES to RCA. LM 50%, LAD 65%, LCX 80% treated medically.  - 08/2017 echo LVEF 55-60%, inferior hypokinesis - medical therapy limited due to soft bp's during admission.  - referred to CT surgery given remaining LM/LAD and LCX disease -s/p CABG 12/12/17 with LIMA-LAD, SVG OM - changed from brillinta to plavix due to cost - 11/2017 echo LVEF 55-60%, no WMAs  No recent chest pain. No SOB/DOE - compliant with meds   - has had some recent chest pain - sharp pain midchest. 7/10 in severity. Can occur at rest or with activity. +SOB +nausea - pain lasts  15-30 minutes - has taken NG with some benefit - limited to chronic back. Some DOE with acitivites which is chronic. - symptoms similar to chest pain prior MI.  - pain going on several months, no change in frequency or severity.  - not positional, no relation to food.    2. PAD - prior to CABG ABI right 0.62, left 1.12 -mainly limited by hip and back pain. Occasional cramping pain right calf/thigh, not limiting.  - chronic stable symptoms    3. Hyperlipidemia - 03/2018 TC 225 TG 236 HDL 33 LDL 145 -  changed her from atorvatatin to crestor 40mg  at that time. Has been on zetia.   - compliant with crestor and zetia.  - has not had repeat labs since last visti     Has not covid vaccine, not in favor of it     The patient does not have symptoms concerning for COVID-19 infection (fever, chills, cough, or new shortness of breath).    Past Medical History:  Diagnosis Date  . Anxiety   . Cancer [facility]) 1994   cervical  . Coronary artery disease    2/19 PCI/DES x1 to dRCA with LM, LAD disease (planned for possible CABG in near future), normal EF  . Coronary artery disease involving native coronary artery of native heart with angina pectoris ([place])   . Diabetes ([place])   . Hypertension   . Itching   . Myocardial infarction ([place])   . RLS (restless legs syndrome)   . 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 Tobacco use    Past Surgical History:  Procedure Laterality Date  . CARDIAC CATHETERIZATION    . CORONARY ARTERY BYPASS GRAFT N/A 12/12/2017   Procedure: MEDIAN STERNOTOMY for CORONARY ARTERY BYPASS GRAFTING (CABG) x 2 (LIMA to LAD, SVG to OM) using RIGHT THIGH GREATER SAPHENOUS VEIN and LEFT INTERNAL MAMMARY ARTERY;  Surgeon: Rexene Alberts, MD;  Location: [facility];  Service: Open Heart Surgery;  Laterality: N/A;  . CORONARY STENT INTERVENTION N/A 09/01/2017   Procedure: CORONARY STENT INTERVENTION;  Surgeon: Martinique, Peter M, MD;  Location: [facility] CV LAB;   Service: Cardiovascular;  Laterality: N/A;  . CORONARY/GRAFT ACUTE MI REVASCULARIZATION N/A 09/01/2017   Procedure: Coronary/Graft Acute MI Revascularization;  Surgeon: Martinique, Peter M, MD;  Location: [name] CV LAB;  Service: Cardiovascular;  Laterality: N/A;  . LEFT HEART CATH AND CORONARY ANGIOGRAPHY N/A 09/01/2017   Procedure: LEFT HEART CATH AND CORONARY ANGIOGRAPHY;  Surgeon: Martinique, Peter M, MD;  Location: [facility] CV LAB;  Service: Cardiovascular;  Laterality: N/A;  . PARTIAL HYSTERECTOMY  2009  . TEE WITHOUT CARDIOVERSION N/A 12/12/2017   Procedure: TRANSESOPHAGEAL ECHOCARDIOGRAM (TEE);  Surgeon: Rexene Alberts, MD;  Location: [facility];  Service: Open Heart Surgery;  Laterality: N/A;     No outpatient medications have been marked as taking for the 11/11/19 encounter (Appointment) with Arnoldo Lenis, MD.     Allergies:   Patient has no known allergies.   Social History   Tobacco Use  . Smoking status: Current Some Day Smoker    Packs/day: 0.75    Years: 35.00    Pack years: 26.25    Types: Cigarettes    Start date: 09/01/1981  . Smokeless tobacco: Never Used  . Tobacco comment: patient has admitted to continued smoking, but is trying to quit  Substance Use Topics  . Alcohol use: No  . Drug use: Never     Family Hx: The patient's family history includes Cancer in her mother; Diabetes in her brother, sister, and sister; Heart attack in her father and sister.  ROS:   Please see the history of present illness.     All other systems reviewed and are negative.   Prior CV studies:   The following studies were reviewed today:  08/2017 cath  Dist RCA lesion is 100% stenosed.  Prox RCA to Mid RCA lesion is 25% stenosed.  Dist LM-2 lesion is 50% stenosed.  Dist LM to Ost LAD lesion is 65% stenosed.  Ost Cx to Prox Cx lesion is 80% stenosed.  Post intervention, there is a 0% residual stenosis.  A drug-eluting stent was successfully placed using a STENT SYNERGY  DES 2.5X32.  The left ventricular systolic function is normal.  LV end diastolic pressure is mildly elevated.  The left ventricular ejection fraction is 55-65% by visual estimate.  1. 3 vessel obstructive CAD. 100% distal RCA is the culprit vessel. She also has 50% distal left main, 65% ostial LAD, and 80% ostial LCx 2. Inferior wall motion abnormality with preserved LV systolic function 3. Mildly elevated LVEDP 4. Successful stenting of the distal RCA with DES  Plan: DAPT for one year. Will need to review films with interventional colleagues to decide management of residual disease in LCA. For now will treat medically. It is not well suited for PCI. Will need to decide whether CABG is needed after she has healed from MI.  Carotid/ABI preCABG [place] Final Interpretation: Right Carotid:  Velocities in the right ICA are consistent with a 1-39% stenosis.  Left Carotid: Velocities in the left ICA are consistent with a 1-39% stenosis. Vertebrals: Bilateral vertebral arteries demonstrate antegrade flow.  Right ABI: Resting right ankle-brachial index indicates moderate right lower extremity arterial disease.  Left ABI: Resting left ankle-brachial index is within normal range. No evidence of significant left lower extremity arterial disease.   Right Upper Extremity: Radial Doppler waveforms decrease >50% with compression. Ulnar Doppler waveforms remain within normal limits with compression.  Left Upper Extremity: Radial and Ulnar Doppler waveforms remain within normal limits with compression.  Labs/Other Tests and Data Reviewed:    EKG:  No ECG reviewed.  Recent Labs: No results found for requested labs within last 8760 hours.   Recent Lipid Panel Lab Results  Component Value Date/Time   CHOL 225 (H) 03/25/2018 03:49 PM   TRIG 236 (H) 03/25/2018 03:49 PM   HDL 33 (L) 03/25/2018 03:49 PM   CHOLHDL 6.8 (H) 03/25/2018 03:49 PM   CHOLHDL 5.8 12/05/2017 03:38 AM   LDLCALC 145 (H)  03/25/2018 03:49 PM    Wt Readings from Last 3 Encounters:  03/02/19 165 lb 12.8 oz (75.2 kg)  11/19/18 166 lb 6.4 oz (75.5 kg)  09/01/18 169 lb 3.2 oz (76.7 kg)     Objective:    Vital Signs:   Today's Vitals   11/11/19 1425  Weight: 167 lb (75.8 kg)  Height: 5' (1.524 m)   Body mass index is 32.61 kg/m. Normal affect. Normal speech pattern and tone. Comfortable, no apparent distress. No audible signs of sob or wheezing.   ASSESSMENT & PLAN:    1. CAD - recent inferior STEMI, s/p stenting. She hadresidual LM 50% and ostial LAD 65%, LCX 80%.She  had a CABG for this remaining disease  - recent chest pain symptoms, somewhat atypical though reports similar to her prior angina - will plan for a lexiscan to further evaluate.   2.Hyperlipidemia - repeat labs - if not at goal on crestor 40 and zetia 10 then will need to consider pcsk9 inhibitor     COVID-19 Education: The signs and symptoms of COVID-19 were discussed with the patient and how to seek care for testing (follow up with PCP or arrange E-visit).  The importance of social distancing was discussed today.  Time:   Today, I have spent 21 minutes with the patient with telehealth technology discussing the above problems.     Medication Adjustments/Labs and Tests Ordered: Current medicines are reviewed at length with the patient today.  Concerns regarding medicines are outlined above.   Tests Ordered: No orders of the defined types were placed in this encounter.   Medication Changes: No orders of the defined types were placed in this encounter.   Follow Up:  Virtual Visit  in 6 week(s)  Signed, Carlyle Dolly, MD  11/11/2019 12:43 PM    [facility]

## 2019-11-25 ENCOUNTER — Other Ambulatory Visit: Payer: Self-pay

## 2019-11-25 ENCOUNTER — Encounter (HOSPITAL_COMMUNITY)
Admission: RE | Admit: 2019-11-25 | Discharge: 2019-11-25 | Disposition: A | Discharge status: Home / Self Care | Payer: Self-pay | Source: Ambulatory Visit | Attending: Dermatology | Admitting: Dermatology

## 2019-11-25 ENCOUNTER — Ambulatory Visit (HOSPITAL_COMMUNITY)
Admission: RE | Admit: 2019-11-25 | Discharge: 2019-11-25 | Disposition: A | Discharge status: Home / Self Care | Payer: Self-pay | Source: Ambulatory Visit | Attending: Cardiology | Admitting: Cardiology

## 2019-11-25 ENCOUNTER — Encounter (HOSPITAL_COMMUNITY): Payer: PRIVATE HEALTH INSURANCE

## 2019-11-25 ENCOUNTER — Encounter (HOSPITAL_COMMUNITY): Payer: Self-pay

## 2019-11-25 DIAGNOSIS — R079 Chest pain, unspecified: Secondary | ICD-10-CM | POA: Insufficient documentation

## 2019-11-25 LAB — NM MYOCAR MULTI W/SPECT W/WALL MOTION / EF
LV dias vol: 57 mL (ref 46–106)
LV sys vol: 14 mL
Peak HR: 100 {beats}/min
RATE: 0.31
Rest HR: 82 {beats}/min
SDS: 2
SRS: 2
SSS: 4
TID: 1.66

## 2019-11-25 MED ORDER — REGADENOSON 0.4 MG/5ML IV SOLN
INTRAVENOUS | Status: AC
  Administered 2019-11-25: 0.4 mg via INTRAVENOUS
  Filled 2019-11-25: qty 5

## 2019-11-25 MED ORDER — TECHNETIUM TC 99M TETROFOSMIN IV KIT
30.0000 | PACK | Freq: Once | INTRAVENOUS | Status: AC | PRN
  Administered 2019-11-25: 31.3 via INTRAVENOUS

## 2019-11-25 MED ORDER — SODIUM CHLORIDE FLUSH 0.9 % IV SOLN
INTRAVENOUS | Status: AC
  Administered 2019-11-25: 10 mL via INTRAVENOUS
  Filled 2019-11-25: qty 10

## 2019-11-25 MED ORDER — TECHNETIUM TC 99M TETROFOSMIN IV KIT
10.0000 | PACK | Freq: Once | INTRAVENOUS | Status: AC | PRN
  Administered 2019-11-25: 10.5 via INTRAVENOUS

## 2019-11-26 ENCOUNTER — Telehealth: Payer: Self-pay | Admitting: *Deleted

## 2019-11-26 NOTE — Telephone Encounter (Signed)
-----   Message from Arnoldo Lenis, MD sent at 11/26/2019  3:49 PM EDT ----- Stress test overall looks good. Small area on the bottom of the heart that could represent a very small blockage that would be considered low risk and typically something we would initially treat with medications, How are her symptoms doing?  Zandra Abts MD

## 2019-11-26 NOTE — Telephone Encounter (Signed)
Pt voiced understanding - says she still having SOB/some chest pain says no worse than what she discussed with provider at [facility]

## 2019-11-27 ENCOUNTER — Telehealth: Payer: Self-pay | Admitting: Cardiology

## 2019-11-27 NOTE — Telephone Encounter (Signed)
Patient asking about medication from yesterday.  Stated she was told someone would call her back about medication

## 2019-11-27 NOTE — Telephone Encounter (Signed)
Scheduled VV with Dr. Harl Bowie for 12/03/2019 at 4:00 pm

## 2019-11-27 NOTE — Telephone Encounter (Signed)
Can she do a virtual next week, I think I saw some spots  JBranch MD

## 2019-11-30 NOTE — Telephone Encounter (Signed)
Pt scheduled for Thursday 5/27

## 2019-12-03 ENCOUNTER — Encounter: Payer: Self-pay | Admitting: Cardiology

## 2019-12-03 ENCOUNTER — Telehealth (INDEPENDENT_AMBULATORY_CARE_PROVIDER_SITE_OTHER): Payer: Self-pay | Admitting: Cardiology

## 2019-12-03 VITALS — Ht 60.0 in | Wt 173.0 lb

## 2019-12-03 DIAGNOSIS — E782 Mixed hyperlipidemia: Secondary | ICD-10-CM

## 2019-12-03 DIAGNOSIS — I2510 Atherosclerotic heart disease of native coronary artery without angina pectoris: Secondary | ICD-10-CM

## 2019-12-03 DIAGNOSIS — R0789 Other chest pain: Secondary | ICD-10-CM

## 2019-12-03 MED ORDER — ISOSORBIDE MONONITRATE ER 30 MG PO TB24
15.0000 mg | ORAL_TABLET | Freq: Every day | ORAL | 1 refills | Status: DC
Start: 2019-12-03 — End: 2020-02-19

## 2019-12-03 NOTE — Patient Instructions (Signed)
Your physician recommends that you schedule a follow-up appointment in: [place] has recommended you make the following change in your medication:   START IMDUR 15 MG (1/2 TABLET) DAILY   Thank you for choosing [facility]!!

## 2019-12-03 NOTE — Addendum Note (Signed)
Addended by: Julian Hy T on: 12/03/2019 03:06 PM   Modules accepted: Orders

## 2019-12-03 NOTE — Progress Notes (Signed)
Virtual Visit via Telephone Note   This visit type was conducted due to national recommendations for restrictions regarding the COVID-19 Pandemic (e.g. social distancing) in an effort to limit this patient's exposure and mitigate transmission in our community.  Due to her co-morbid illnesses, this patient is at least at moderate risk for complications without adequate follow up.  This format is felt to be most appropriate for this patient at this time.  The patient did not have access to video technology/had technical difficulties with video requiring transitioning to audio format only (telephone).  All issues noted in this document were discussed and addressed.  No physical exam could be performed with this format.  Please refer to the patient's chart for her  consent to telehealth for [facility].   The patient was identified using 2 identifiers.  Date:  12/03/2019   ID:  Melissa Rubio, DOB 07/12/1970, MRN [id number]  Patient Location: Home Provider Location: Office  PCP:  Terald Sleeper, PA-C  Cardiologist:  Carlyle Dolly, MD  Electrophysiologist:  None   Evaluation Performed:  Follow-Up Visit  Chief Complaint:  Follow visit, focused on CAD and chest pain.   History of Present Illness:    Melissa Rubio is a 49 y.o. female seen today for follow up of the following medical problems.  1. 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 Full report as indicated below, received DES to RCA. LM 50%, LAD 65%, LCX 80% treated medically.  - 08/2017 echo LVEF 55-60%, inferior hypokinesis - medical therapy limited due to soft bp's during admission.  - referred to CT surgery given remaining LM/LAD and LCX disease -s/p CABG 12/12/17 with LIMA-LAD, SVG OM - changed from brillinta to plavix due to cost - 11/2017 echo LVEF 55-60%, no WMAs  No recent chest pain. No SOB/DOE - compliant with meds   - has had some recent chest pain - sharp pain midchest. 7/10 in severity. Can occur at rest or with  activity. +SOB +nausea - pain lasts 15-30 minutes - has taken NG with some benefit - limited to chronic back. Some DOE with acitivites which is chronic. - symptoms similar to chest pain prior MI.  - pain going on several months, no change in frequency or severity.  - not positional, no relation to food.    - 11/2019 nuclear stress: prior inferior infarction with mild peri-infarct ischemia. Low risk study - symptoms ongoing. Better with NG.  -     2. PAD - prior to CABG ABI right 0.62, left 1.12 -mainly limited by hip and back pain. Occasional cramping pain right calf/thigh, not limiting.  - chronic stable symptoms    3. Hyperlipidemia - 03/2018 TC 225 TG 236 HDL 33 LDL 145 - changed her from atorvatatin to crestor 40mg  at that time. Has been on zetia.   - compliant with crestor and zetia.  - was to have repeat labs, do not see in our system.      Has not covid vaccine, not in favor of it  The patient does not have symptoms concerning for COVID-19 infection (fever, chills, cough, or new shortness of breath).    Past Medical History:  Diagnosis Date  . Anxiety   . Cancer [facility]) 1994   cervical  . Coronary artery disease    2/19 PCI/DES x1 to dRCA with LM, LAD disease (planned for possible CABG in near future), normal EF  . Coronary artery disease involving native coronary artery of native heart with angina pectoris ([place])   .  Diabetes ([place])   . Hypertension   . Itching   . Myocardial infarction ([place])   . RLS (restless legs syndrome)   . 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 Tobacco use    Past Surgical History:  Procedure Laterality Date  . CARDIAC CATHETERIZATION    . CORONARY ARTERY BYPASS GRAFT N/A 12/12/2017   Procedure: MEDIAN STERNOTOMY for CORONARY ARTERY BYPASS GRAFTING (CABG) x 2 (LIMA to LAD, SVG to OM) using RIGHT THIGH GREATER SAPHENOUS VEIN and LEFT INTERNAL MAMMARY ARTERY;  Surgeon: Rexene Alberts, MD;  Location: [place];   Service: Open Heart Surgery;  Laterality: N/A;  . CORONARY STENT INTERVENTION N/A 09/01/2017   Procedure: CORONARY STENT INTERVENTION;  Surgeon: Martinique, Peter M, MD;  Location: [place] CV LAB;  Service: Cardiovascular;  Laterality: N/A;  . CORONARY/GRAFT ACUTE MI REVASCULARIZATION N/A 09/01/2017   Procedure: Coronary/Graft Acute MI Revascularization;  Surgeon: Martinique, Peter M, MD;  Location: [place] CV LAB;  Service: Cardiovascular;  Laterality: N/A;  . LEFT HEART CATH AND CORONARY ANGIOGRAPHY N/A 09/01/2017   Procedure: LEFT HEART CATH AND CORONARY ANGIOGRAPHY;  Surgeon: Martinique, Peter M, MD;  Location: [place] CV LAB;  Service: Cardiovascular;  Laterality: N/A;  . PARTIAL HYSTERECTOMY  2009  . TEE WITHOUT CARDIOVERSION N/A 12/12/2017   Procedure: TRANSESOPHAGEAL ECHOCARDIOGRAM (TEE);  Surgeon: Rexene Alberts, MD;  Location: [place];  Service: Open Heart Surgery;  Laterality: N/A;     Current Meds  Medication Sig  . ALPRAZolam (XANAX) 1 MG tablet Take 0.5 tablets (0.5 mg total) by mouth 3 (three) times daily as needed for anxiety.  Marland Kitchen aspirin 81 MG chewable tablet Chew 1 tablet (81 mg total) by mouth daily.  . busPIRone (BUSPAR) 30 MG tablet Take 1 tablet (30 mg total) by mouth 3 (three) times daily.  . carvedilol (COREG) 3.125 MG tablet Take 1 tablet (3.125 mg total) by mouth 2 (two) times daily with a meal.  . citalopram (CELEXA) 40 MG tablet Take 1 tablet (40 mg total) by mouth daily.  . cyclobenzaprine (FLEXERIL) 10 MG tablet Take 1 tablet (10 mg total) by mouth 3 (three) times daily as needed for muscle spasms.  Marland Kitchen ezetimibe (ZETIA) 10 MG tablet TAKE 1 TABLET ONCE A DAY  . gabapentin (NEURONTIN) 800 MG tablet Take 1 tablet (800 mg total) by mouth 4 (four) times daily.  . hydrOXYzine (ATARAX/VISTARIL) 50 MG tablet Take 1 tablet (50 mg total) by mouth every 8 (eight) hours as needed. (Patient taking differently: Take 50 mg by mouth every 8 (eight) hours as needed for anxiety or itching.  )  . ibuprofen (ADVIL) 800 MG tablet Take 1 tablet (800 mg total) by mouth every 8 (eight) hours as needed.  . insulin NPH Human (HUMULIN N,NOVOLIN N) 100 UNIT/ML injection Inject 0.4 mLs (40 Units total) 2 (two) times daily before a meal into the skin. (Patient taking differently: Inject 30-40 Units into the skin See admin instructions. 40 units in the morning and 30 units at night)  . INSULIN SYRINGE .5CC/29G 29G X 1/2" 0.5 ML MISC 1 Syringe by Does not apply route 2 (two) times daily.  Marland Kitchen lisinopril (ZESTRIL) 2.5 MG tablet Take 1 tablet (2.5 mg total) by mouth daily.  . metFORMIN (GLUCOPHAGE) 1000 MG tablet Take 1 tablet (1,000 mg total) by mouth 2 (two) times daily with a meal.  . nitroGLYCERIN (NITROSTAT) 0.4 MG SL tablet Place 1 tablet (0.4 mg total)  under the tongue every 5 (five) minutes as needed for chest pain.  Marland Kitchen omeprazole (PRILOSEC) 20 MG capsule Take 20 mg by mouth daily.  . rosuvastatin (CRESTOR) 40 MG tablet TAKE 1 TABLET ONCE A DAY     Allergies:   Patient has no known allergies.   Social History   Tobacco Use  . Smoking status: Current Some Day Smoker    Packs/day: 0.75    Years: 35.00    Pack years: 26.25    Types: Cigarettes    Start date: 09/01/1981  . Smokeless tobacco: Never Used  . Tobacco comment: patient has admitted to continued smoking, but is trying to quit  Substance Use Topics  . Alcohol use: No  . Drug use: Never     Family Hx: The patient's family history includes Cancer in her mother; Diabetes in her brother, sister, and sister; Heart attack in her father and sister.  ROS:   Please see the history of present illness.     All other systems reviewed and are negative.   Prior CV studies:   The following studies were reviewed today:  08/2017 cath  Dist RCA lesion is 100% stenosed.  Prox RCA to Mid RCA lesion is 25% stenosed.  Dist LM-2 lesion is 50% stenosed.  Dist LM to Ost LAD lesion is 65% stenosed.  Ost Cx to Prox Cx lesion is 80%  stenosed.  Post intervention, there is a 0% residual stenosis.  A drug-eluting stent was successfully placed using a STENT SYNERGY DES 2.5X32.  The left ventricular systolic function is normal.  LV end diastolic pressure is mildly elevated.  The left ventricular ejection fraction is 55-65% by visual estimate.  1. 3 vessel obstructive CAD. 100% distal RCA is the culprit vessel. She also has 50% distal left main, 65% ostial LAD, and 80% ostial LCx 2. Inferior wall motion abnormality with preserved LV systolic function 3. Mildly elevated LVEDP 4. Successful stenting of the distal RCA with DES  Plan: DAPT for one year. Will need to review films with interventional colleagues to decide management of residual disease in LCA. For now will treat medically. It is not well suited for PCI. Will need to decide whether CABG is needed after she has healed from MI.  Carotid/ABI preCABG [place] Final Interpretation: Right Carotid: Velocities in the right ICA are consistent with a 1-39% stenosis.  Left Carotid: Velocities in the left ICA are consistent with a 1-39% stenosis. Vertebrals: Bilateral vertebral arteries demonstrate antegrade flow.  Right ABI: Resting right ankle-brachial index indicates moderate right lower extremity arterial disease.  Left ABI: Resting left ankle-brachial index is within normal range. No evidence of significant left lower extremity arterial disease.   Right Upper Extremity: Radial Doppler waveforms decrease >50% with compression. Ulnar Doppler waveforms remain within normal limits with compression.  Left Upper Extremity: Radial and Ulnar Doppler waveforms remain within normal limits with compression.  Labs/Other Tests and Data Reviewed:    EKG:  No ECG reviewed.  Recent Labs: No results found for requested labs within last 8760 hours.   Recent Lipid Panel Lab Results  Component Value Date/Time   CHOL 225 (H) 03/25/2018 03:49 PM   TRIG 236 (H) 03/25/2018  03:49 PM   HDL 33 (L) 03/25/2018 03:49 PM   CHOLHDL 6.8 (H) 03/25/2018 03:49 PM   CHOLHDL 5.8 12/05/2017 03:38 AM   LDLCALC 145 (H) 03/25/2018 03:49 PM    Wt Readings from Last 3 Encounters:  12/03/19 173 lb (78.5 kg)  11/11/19  167 lb (75.8 kg)  03/02/19 165 lb 12.8 oz (75.2 kg)     Objective:    Vital Signs:  Ht 5' (1.524 m)   Wt 173 lb (78.5 kg)   LMP 04/17/2014   BMI 33.79 kg/m    Normal affect. Normal speeceh pattern and tone. Comfortable, no apparent distress. No audible signs of sob or wheezing.   ASSESSMENT & PLAN:    1. CAD - prior inferior STEMI, s/p stenting. She hadresidual LM 50% and ostial LAD 65%, LCX 80%.She had a CABG for this remaining disease  - recent chest pain symptoms, somewhat atypical though reports similar to her prior angina -nuclear stress with inferior infarct mild ischemia, low risk - symptoms stabel since onset. Trial of imdur 15mg  daily - if significant progression of symptoms or fails medical therapy consider cath at that time.   2.Hyperlipidemia - check on her repeat labs.  - if not at goal on crestor 40 and zetia 10 then will need to consider pcsk9 inhibitor  COVID-19 Education: The signs and symptoms of COVID-19 were discussed with the patient and how to seek care for testing (follow up with PCP or arrange E-visit).  The importance of social distancing was discussed today.  Time:   Today, I have spent 16 minutes with the patient with telehealth technology discussing the above problems.     Medication Adjustments/Labs and Tests Ordered: Current medicines are reviewed at length with the patient today.  Concerns regarding medicines are outlined above.   Tests Ordered: No orders of the defined types were placed in this encounter.   Medication Changes: No orders of the defined types were placed in this encounter.   Follow Up:  Virtual Visit  in 6 week(s)  Signed, Carlyle Dolly, MD  12/03/2019 2:18 PM    [place]

## 2019-12-24 ENCOUNTER — Telehealth: Payer: PRIVATE HEALTH INSURANCE | Admitting: Cardiology

## 2020-01-12 ENCOUNTER — Other Ambulatory Visit (HOSPITAL_COMMUNITY)
Admission: RE | Admit: 2020-01-12 | Discharge: 2020-01-12 | Disposition: A | Discharge status: Home / Self Care | Payer: Self-pay | Source: Ambulatory Visit | Attending: Cardiology | Admitting: Cardiology

## 2020-01-12 ENCOUNTER — Other Ambulatory Visit: Payer: Self-pay

## 2020-01-12 ENCOUNTER — Encounter: Payer: Self-pay | Admitting: Cardiology

## 2020-01-12 ENCOUNTER — Telehealth (INDEPENDENT_AMBULATORY_CARE_PROVIDER_SITE_OTHER): Payer: Self-pay | Admitting: Cardiology

## 2020-01-12 VITALS — Ht 60.0 in | Wt 172.0 lb

## 2020-01-12 DIAGNOSIS — I2510 Atherosclerotic heart disease of native coronary artery without angina pectoris: Secondary | ICD-10-CM | POA: Insufficient documentation

## 2020-01-12 DIAGNOSIS — E782 Mixed hyperlipidemia: Secondary | ICD-10-CM

## 2020-01-12 LAB — LIPID PANEL
Cholesterol: 122 mg/dL (ref 0–200)
HDL: 26 mg/dL — ABNORMAL LOW (ref >40)
LDL Cholesterol: 69 mg/dL (ref 0–99)
Total CHOL/HDL Ratio: 4.7 RATIO
Triglycerides: 137 mg/dL (ref <150)
VLDL: 27 mg/dL (ref 0–40)

## 2020-01-12 LAB — CBC
HCT: 42.7 % (ref 36.0–46.0)
Hemoglobin: 14 g/dL (ref 12.0–15.0)
MCH: 30.2 pg (ref 26.0–34.0)
MCHC: 32.8 g/dL (ref 30.0–36.0)
MCV: 92 fL (ref 80.0–100.0)
Platelets: 268 10*3/uL (ref 150–400)
RBC: 4.64 MIL/uL (ref 3.87–5.11)
RDW: 14.4 % (ref 11.5–15.5)
WBC: 10.4 10*3/uL (ref 4.0–10.5)
nRBC: 0 % (ref 0.0–0.2)

## 2020-01-12 LAB — BASIC METABOLIC PANEL
Anion gap: 10 (ref 5–15)
BUN: 13 mg/dL (ref 6–20)
CO2: 24 mmol/L (ref 22–32)
Calcium: 9.4 mg/dL (ref 8.9–10.3)
Chloride: 105 mmol/L (ref 98–111)
Creatinine, Ser: 0.65 mg/dL (ref 0.44–1.00)
GFR calc Af Amer: >60 mL/min (ref >60)
GFR calc non Af Amer: >60 mL/min (ref >60)
Glucose, Bld: 308 mg/dL — ABNORMAL HIGH (ref 70–99)
Potassium: 4.2 mmol/L (ref 3.5–5.1)
Sodium: 139 mmol/L (ref 135–145)

## 2020-01-12 LAB — HEMOGLOBIN A1C
Hgb A1c MFr Bld: 10.8 % — ABNORMAL HIGH (ref 4.8–5.6)
Mean Plasma Glucose: 263.26 mg/dL

## 2020-01-12 LAB — TSH: TSH: 3.672 u[IU]/mL (ref 0.350–4.500)

## 2020-01-12 LAB — MAGNESIUM: Magnesium: 2 mg/dL (ref 1.7–2.4)

## 2020-01-12 NOTE — Patient Instructions (Signed)
Your physician recommends that you schedule a follow-up appointment in: 4 MONTHS WITH DR BRANCH  Your physician recommends that you continue on your current medications as directed. Please refer to the Current Medication list given to you today.  Thank you for choosing [facility]!!    

## 2020-01-12 NOTE — Progress Notes (Signed)
Virtual Visit via Telephone Note   This visit type was conducted due to national recommendations for restrictions regarding the COVID-19 Pandemic (e.g. social distancing) in an effort to limit this patient's exposure and mitigate transmission in our community.  Due to her co-morbid illnesses, this patient is at least at moderate risk for complications without adequate follow up.  This format is felt to be most appropriate for this patient at this time.  The patient did not have access to video technology/had technical difficulties with video requiring transitioning to audio format only (telephone).  All issues noted in this document were discussed and addressed.  No physical exam could be performed with this format.  Please refer to the patient's chart for her  consent to telehealth for [facility].   The patient was identified using 2 identifiers.  Date:  01/12/2020   ID:  Melissa Rubio, DOB Aug 09, 1970, MRN [id number]  Patient Location: Home Provider Location: Office  PCP:  Terald Sleeper, PA-C  Cardiologist:  Carlyle Dolly, MD  Electrophysiologist:  None   Evaluation Performed:  Follow-Up Visit  Chief Complaint:  Follow up  History of Present Illness:    Melissa Rubio is a 49 y.o. female seen today for follow up of the following medical problems.  1. 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 Full report as indicated below, received DES to RCA. LM 50%, LAD 65%, LCX 80% treated medically.  - 08/2017 echo LVEF 55-60%, inferior hypokinesis - medical therapy limited due to soft bp's during admission.  - referred to CT surgery given remaining LM/LAD and LCX disease -s/p CABG 12/12/17 with LIMA-LAD, SVG OM - changed from brillinta to plavix due to cost - 11/2017 echo LVEF 55-60%, no WMAs    - at prior visit reported some chest pain - sharp pain midchest. 7/10 in severity. Can occur at rest or with activity. +SOB +nausea -pain lasts 15-30 minutes - has taken NG with some benefit -  limited to chronic back. Some DOE with acitivites which is chronic. - symptoms similar to chest pain prior MI.  - pain going on several months, no change in frequency or severity.  - not positional, no relation to food.   - 11/2019 nuclear stress: prior inferior infarction with mild peri-infarct ischemia. Low risk study - last visit we started imdur 15mg  daily.  - symptoms have with starting.     2. PAD - prior to CABG ABI right 0.62, left 1.12 -mainly limited by hip and back pain. Occasional cramping pain right calf/thigh, not limiting. - chronic stable symptoms    3. Hyperlipidemia - 03/2018 TC 225 TG 236 HDL 33 LDL 145 - changed her from atorvatatin to crestor 40mg  at that time. Has been on zetia.   - compliant with crestor and zetia.  - has not gone for her repeat labs yest     Has not covid vaccine, not in favor of it  The patient does not have symptoms concerning for COVID-19 infection (fever, chills, cough, or new shortness of breath).    Past Medical History:  Diagnosis Date  . Anxiety   . Cancer [facility]) 1994   cervical  . Coronary artery disease    2/19 PCI/DES x1 to dRCA with LM, LAD disease (planned for possible CABG in near future), normal EF  . Coronary artery disease involving native coronary artery of native heart with angina pectoris ([place])   . Diabetes ([place])   . Hypertension   . Itching   . Myocardial infarction ([place])   .  RLS (restless legs syndrome)   . 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 Tobacco use    Past Surgical History:  Procedure Laterality Date  . CARDIAC CATHETERIZATION    . CORONARY ARTERY BYPASS GRAFT N/A 12/12/2017   Procedure: MEDIAN STERNOTOMY for CORONARY ARTERY BYPASS GRAFTING (CABG) x 2 (LIMA to LAD, SVG to OM) using RIGHT THIGH GREATER SAPHENOUS VEIN and LEFT INTERNAL MAMMARY ARTERY;  Surgeon: Rexene Alberts, MD;  Location: [place];  Service: Open Heart Surgery;  Laterality: N/A;  . CORONARY STENT  INTERVENTION N/A 09/01/2017   Procedure: CORONARY STENT INTERVENTION;  Surgeon: Martinique, Peter M, MD;  Location: [place] CV LAB;  Service: Cardiovascular;  Laterality: N/A;  . CORONARY/GRAFT ACUTE MI REVASCULARIZATION N/A 09/01/2017   Procedure: Coronary/Graft Acute MI Revascularization;  Surgeon: Martinique, Peter M, MD;  Location: [place] CV LAB;  Service: Cardiovascular;  Laterality: N/A;  . LEFT HEART CATH AND CORONARY ANGIOGRAPHY N/A 09/01/2017   Procedure: LEFT HEART CATH AND CORONARY ANGIOGRAPHY;  Surgeon: Martinique, Peter M, MD;  Location: [place] CV LAB;  Service: Cardiovascular;  Laterality: N/A;  . PARTIAL HYSTERECTOMY  2009  . TEE WITHOUT CARDIOVERSION N/A 12/12/2017   Procedure: TRANSESOPHAGEAL ECHOCARDIOGRAM (TEE);  Surgeon: Rexene Alberts, MD;  Location: [place];  Service: Open Heart Surgery;  Laterality: N/A;     No outpatient medications have been marked as taking for the 01/12/20 encounter (Appointment) with Arnoldo Lenis, MD.     Allergies:   Patient has no known allergies.   Social History   Tobacco Use  . Smoking status: Current Some Day Smoker    Packs/day: 0.75    Years: 35.00    Pack years: 26.25    Types: Cigarettes    Start date: 09/01/1981  . Smokeless tobacco: Never Used  . Tobacco comment: patient has admitted to continued smoking, but is trying to quit  Vaping Use  . Vaping Use: Never used  Substance Use Topics  . Alcohol use: No  . Drug use: Never     Family Hx: The patient's family history includes Cancer in her mother; Diabetes in her brother, sister, and sister; Heart attack in her father and sister.  ROS:   Please see the history of present illness.     All other systems reviewed and are negative.   Prior CV studies:   The following studies were reviewed today:  08/2017 cath  Dist RCA lesion is 100% stenosed.  Prox RCA to Mid RCA lesion is 25% stenosed.  Dist LM-2 lesion is 50% stenosed.  Dist LM to Ost LAD lesion is 65%  stenosed.  Ost Cx to Prox Cx lesion is 80% stenosed.  Post intervention, there is a 0% residual stenosis.  A drug-eluting stent was successfully placed using a STENT SYNERGY DES 2.5X32.  The left ventricular systolic function is normal.  LV end diastolic pressure is mildly elevated.  The left ventricular ejection fraction is 55-65% by visual estimate.  1. 3 vessel obstructive CAD. 100% distal RCA is the culprit vessel. She also has 50% distal left main, 65% ostial LAD, and 80% ostial LCx 2. Inferior wall motion abnormality with preserved LV systolic function 3. Mildly elevated LVEDP 4. Successful stenting of the distal RCA with DES  Plan: DAPT for one year. Will need to review films with interventional colleagues to decide management of residual disease in LCA. For now will treat medically. It is not well suited for  PCI. Will need to decide whether CABG is needed after she has healed from MI.  Carotid/ABI preCABG [place] Final Interpretation: Right Carotid: Velocities in the right ICA are consistent with a 1-39% stenosis.  Left Carotid: Velocities in the left ICA are consistent with a 1-39% stenosis. Vertebrals: Bilateral vertebral arteries demonstrate antegrade flow.  Right ABI: Resting right ankle-brachial index indicates moderate right lower extremity arterial disease.  Left ABI: Resting left ankle-brachial index is within normal range. No evidence of significant left lower extremity arterial disease.   Right Upper Extremity: Radial Doppler waveforms decrease >50% with compression. Ulnar Doppler waveforms remain within normal limits with compression.  Left Upper Extremity: Radial and Ulnar Doppler waveforms remain within normal limits with compression.  Labs/Other Tests and Data Reviewed:    EKG:  No ECG reviewed.  Recent Labs: No results found for requested labs within last 8760 hours.   Recent Lipid Panel Lab Results  Component Value Date/Time   CHOL 225 (H)  03/25/2018 03:49 PM   TRIG 236 (H) 03/25/2018 03:49 PM   HDL 33 (L) 03/25/2018 03:49 PM   CHOLHDL 6.8 (H) 03/25/2018 03:49 PM   CHOLHDL 5.8 12/05/2017 03:38 AM   LDLCALC 145 (H) 03/25/2018 03:49 PM    Wt Readings from Last 3 Encounters:  12/03/19 173 lb (78.5 kg)  11/11/19 167 lb (75.8 kg)  03/02/19 165 lb 12.8 oz (75.2 kg)     Objective:    Vital Signs:   Today's Vitals   01/12/20 0941  Weight: 172 lb (78 kg)  Height: 5' (1.524 m)   Body mass index is 33.59 kg/m.  Normal affect. Normal speech pattern and tone. Comfortable, no apparent distress. No audible signs of sob or wheezing.   ASSESSMENT & PLAN:    1. CAD - prior inferior STEMI, s/p stenting. She hadresidual LM 50% and ostial LAD 65%, LCX 80%.She had a CABG for this remaining disease  -recent chest pain symptoms, somewhat atypical though reports similar to her prior angina -nuclear stress with inferior infarct mild ischemia, low risk -symptoms have resolved since starting imdur, if recurrence would titrate up imdur  2.Hyperlipidemia - will order her labs, she will go today to have done at [facility]  COVID-19 Education: The signs and symptoms of COVID-19 were discussed with the patient and how to seek care for testing (follow up with PCP or arrange E-visit).  The importance of social distancing was discussed today.  Time:   Today, I have spent 14 minutes with the patient with telehealth technology discussing the above problems.     Medication Adjustments/Labs and Tests Ordered: Current medicines are reviewed at length with the patient today.  Concerns regarding medicines are outlined above.   Tests Ordered: No orders of the defined types were placed in this encounter.   Medication Changes: No orders of the defined types were placed in this encounter.   Follow Up:  In Person in 4 month(s)  Signed, Carlyle Dolly, MD  01/12/2020 8:24 AM    [place]

## 2020-01-19 ENCOUNTER — Telehealth: Payer: Self-pay | Admitting: *Deleted

## 2020-01-19 NOTE — Telephone Encounter (Signed)
-----   Message from Arnoldo Lenis, MD sent at 01/19/2020 12:49 PM EDT ----- Labs show her diabetes has significantly worsened, needs f/u with pcp ASAP. Cholesterol much improved and at goal  Zandra Abts MD

## 2020-01-19 NOTE — Telephone Encounter (Signed)
LM to return call.

## 2020-01-20 NOTE — Telephone Encounter (Signed)
Returning call - please call [phone]

## 2020-01-20 NOTE — Telephone Encounter (Signed)
Patient informed and verbalized understanding of plan. Copy sent to PCP 

## 2020-01-25 ENCOUNTER — Telehealth: Payer: Self-pay | Admitting: Cardiology

## 2020-01-25 NOTE — Telephone Encounter (Signed)
Pt is wanting to travel to [place] and is wanting to make sure it's safe for her to travel with her condition  [phone]

## 2020-01-25 NOTE — Telephone Encounter (Signed)
Ok to travel from a cardiac standpoint   Zandra Abts MD

## 2020-01-25 NOTE — Telephone Encounter (Signed)
Patient notified and verbalized understanding. 

## 2020-02-18 ENCOUNTER — Other Ambulatory Visit: Payer: Self-pay | Admitting: Cardiology

## 2020-02-18 ENCOUNTER — Other Ambulatory Visit: Payer: Self-pay | Admitting: Family Medicine

## 2020-02-18 DIAGNOSIS — G894 Chronic pain syndrome: Secondary | ICD-10-CM

## 2020-02-18 DIAGNOSIS — I25119 Atherosclerotic heart disease of native coronary artery with unspecified angina pectoris: Secondary | ICD-10-CM

## 2020-02-18 DIAGNOSIS — I10 Essential (primary) hypertension: Secondary | ICD-10-CM

## 2020-02-18 DIAGNOSIS — G8929 Other chronic pain: Secondary | ICD-10-CM

## 2020-02-18 DIAGNOSIS — E1065 Type 1 diabetes mellitus with hyperglycemia: Secondary | ICD-10-CM

## 2020-02-18 NOTE — Telephone Encounter (Signed)
This is Dr. Branch's pt 

## 2020-02-19 ENCOUNTER — Other Ambulatory Visit: Payer: Self-pay

## 2020-02-19 DIAGNOSIS — I10 Essential (primary) hypertension: Secondary | ICD-10-CM

## 2020-02-19 DIAGNOSIS — F419 Anxiety disorder, unspecified: Secondary | ICD-10-CM

## 2020-02-19 MED ORDER — CARVEDILOL 3.125 MG PO TABS: 3.1250 mg | ORAL_TABLET | Freq: Two times a day (BID) | ORAL | 1 refills | Status: DC

## 2020-02-19 MED ORDER — LISINOPRIL 2.5 MG PO TABS: 2.5000 mg | ORAL_TABLET | Freq: Every day | ORAL | 1 refills | Status: DC

## 2020-02-19 MED ORDER — ISOSORBIDE MONONITRATE ER 30 MG PO TB24
15.0000 mg | ORAL_TABLET | Freq: Every day | ORAL | 1 refills | Status: DC
Start: 2020-02-19 — End: 2020-05-02

## 2020-02-19 NOTE — Telephone Encounter (Signed)
Refilled imdur, coreg, lisinopril

## 2020-02-23 ENCOUNTER — Other Ambulatory Visit: Payer: Self-pay | Admitting: *Deleted

## 2020-02-23 DIAGNOSIS — F419 Anxiety disorder, unspecified: Secondary | ICD-10-CM

## 2020-03-11 ENCOUNTER — Encounter: Payer: Self-pay | Admitting: Family Medicine

## 2020-03-11 ENCOUNTER — Other Ambulatory Visit: Payer: Self-pay

## 2020-03-11 ENCOUNTER — Ambulatory Visit (INDEPENDENT_AMBULATORY_CARE_PROVIDER_SITE_OTHER): Payer: Self-pay | Admitting: Family Medicine

## 2020-03-11 VITALS — BP 121/81 | HR 100 | Temp 98.0°F | Ht 60.0 in | Wt 169.0 lb

## 2020-03-11 DIAGNOSIS — E1165 Type 2 diabetes mellitus with hyperglycemia: Secondary | ICD-10-CM

## 2020-03-11 DIAGNOSIS — M545 Low back pain, unspecified: Secondary | ICD-10-CM

## 2020-03-11 DIAGNOSIS — E785 Hyperlipidemia, unspecified: Secondary | ICD-10-CM

## 2020-03-11 DIAGNOSIS — G8929 Other chronic pain: Secondary | ICD-10-CM

## 2020-03-11 DIAGNOSIS — F53 Postpartum depression: Secondary | ICD-10-CM

## 2020-03-11 DIAGNOSIS — I10 Essential (primary) hypertension: Secondary | ICD-10-CM

## 2020-03-11 DIAGNOSIS — O99345 Other mental disorders complicating the puerperium: Secondary | ICD-10-CM

## 2020-03-11 DIAGNOSIS — L299 Pruritus, unspecified: Secondary | ICD-10-CM

## 2020-03-11 DIAGNOSIS — F419 Anxiety disorder, unspecified: Secondary | ICD-10-CM

## 2020-03-11 DIAGNOSIS — G894 Chronic pain syndrome: Secondary | ICD-10-CM

## 2020-03-11 LAB — BAYER DCA HB A1C WAIVED: HB A1C (BAYER DCA - WAIVED): 12.3 % — ABNORMAL HIGH (ref <7.0)

## 2020-03-11 MED ORDER — BUSPIRONE HCL 30 MG PO TABS: 30.0000 mg | ORAL_TABLET | Freq: Three times a day (TID) | ORAL | 0 refills | Status: DC

## 2020-03-11 MED ORDER — CITALOPRAM HYDROBROMIDE 40 MG PO TABS: 40.0000 mg | ORAL_TABLET | Freq: Every day | ORAL | 0 refills | Status: DC

## 2020-03-11 MED ORDER — INSULIN NPH (HUMAN) (ISOPHANE) 100 UNIT/ML ~~LOC~~ SUSP: 40.0000 [IU] | Freq: Two times a day (BID) | SUBCUTANEOUS | 0 refills | Status: DC

## 2020-03-11 MED ORDER — "INSULIN SYRINGE 29G X 1/2"" 0.5 ML MISC": 1.0000 | Freq: Two times a day (BID) | 0 refills | Status: DC

## 2020-03-11 MED ORDER — HYDROXYZINE HCL 50 MG PO TABS: 50.0000 mg | ORAL_TABLET | Freq: Three times a day (TID) | ORAL | 0 refills | Status: DC | PRN

## 2020-03-11 MED ORDER — LISINOPRIL 2.5 MG PO TABS: 2.5000 mg | ORAL_TABLET | Freq: Every day | ORAL | 0 refills | Status: DC

## 2020-03-11 MED ORDER — METFORMIN HCL 1000 MG PO TABS: 1000.0000 mg | ORAL_TABLET | Freq: Two times a day (BID) | ORAL | 0 refills | Status: DC

## 2020-03-12 LAB — CMP14+EGFR
ALT: 13 IU/L (ref 0–32)
AST: 12 IU/L (ref 0–40)
Albumin/Globulin Ratio: 1.6 (ref 1.2–2.2)
Albumin: 4.4 g/dL (ref 3.8–4.8)
Alkaline Phosphatase: 117 IU/L (ref 48–121)
BUN/Creatinine Ratio: 16 (ref 9–23)
BUN: 12 mg/dL (ref 6–24)
Bilirubin Total: 0.2 mg/dL (ref 0.0–1.2)
CO2: 21 mmol/L (ref 20–29)
Calcium: 9.8 mg/dL (ref 8.7–10.2)
Chloride: 100 mmol/L (ref 96–106)
Creatinine, Ser: 0.76 mg/dL (ref 0.57–1.00)
GFR calc Af Amer: 107 mL/min/{1.73_m2} (ref >59)
GFR calc non Af Amer: 92 mL/min/{1.73_m2} (ref >59)
Globulin, Total: 2.8 g/dL (ref 1.5–4.5)
Glucose: 307 mg/dL — ABNORMAL HIGH (ref 65–99)
Potassium: 4.3 mmol/L (ref 3.5–5.2)
Sodium: 135 mmol/L (ref 134–144)
Total Protein: 7.2 g/dL (ref 6.0–8.5)

## 2020-03-12 LAB — CBC WITH DIFFERENTIAL/PLATELET
Basophils Absolute: 0.1 10*3/uL (ref 0.0–0.2)
Basos: 0 %
EOS (ABSOLUTE): 0.2 10*3/uL (ref 0.0–0.4)
Eos: 2 %
Hematocrit: 44.7 % (ref 34.0–46.6)
Hemoglobin: 14.9 g/dL (ref 11.1–15.9)
Immature Grans (Abs): 0 10*3/uL (ref 0.0–0.1)
Immature Granulocytes: 0 %
Lymphocytes Absolute: 2.8 10*3/uL (ref 0.7–3.1)
Lymphs: 22 %
MCH: 29.9 pg (ref 26.6–33.0)
MCHC: 33.3 g/dL (ref 31.5–35.7)
MCV: 90 fL (ref 79–97)
Monocytes Absolute: 0.7 10*3/uL (ref 0.1–0.9)
Monocytes: 5 %
Neutrophils Absolute: 9.1 10*3/uL — ABNORMAL HIGH (ref 1.4–7.0)
Neutrophils: 71 %
Platelets: 266 10*3/uL (ref 150–450)
RBC: 4.98 x10E6/uL (ref 3.77–5.28)
RDW: 13 % (ref 11.7–15.4)
WBC: 12.9 10*3/uL — ABNORMAL HIGH (ref 3.4–10.8)

## 2020-03-12 LAB — THYROID PANEL WITH TSH
Free Thyroxine Index: 1.6 (ref 1.2–4.9)
T3 Uptake Ratio: 24 % (ref 24–39)
T4, Total: 6.5 ug/dL (ref 4.5–12.0)
TSH: 1.62 u[IU]/mL (ref 0.450–4.500)

## 2020-03-14 ENCOUNTER — Encounter: Payer: Self-pay | Admitting: Family Medicine

## 2020-03-14 NOTE — Progress Notes (Signed)
Subjective:  Patient ID: Melissa Rubio, female    DOB: 1970/10/24  Age: 49 y.o. MRN: [id number]  CC: Follow-up   HPI Melissa Rubio presents for  presents for  follow-up of hypertension. Patient has no history of headache chest pain or shortness of breath or recent cough. Patient also denies symptoms of TIA such as focal numbness or weakness. Patient denies side effects from medication. States taking it regularly.  presents forFollow-up of diabetes.Patient denies symptoms such as polyuria, polydipsia, excessive hunger, nausea No significant hypoglycemic spells noted. Medications reviewed. Pt reports taking them regularly without complication/adverse reaction being reported today.   Pt. Is also followed fr anxiety for which she takes xanax. She also takes oxycodone for chronic pain.  Depression screen [facility] 2/9 03/11/2020 03/02/2019 11/19/2018  Decreased Interest 0 0 1  Down, Depressed, Hopeless 0 0 0  PHQ - 2 Score 0 0 1  Altered sleeping - - -  Tired, decreased energy - - -  Change in appetite - - -  Feeling bad or failure about yourself  - - -  Trouble concentrating - - -  Moving slowly or fidgety/restless - - -  Suicidal thoughts - - -  PHQ-9 Score - - -  Difficult doing work/chores - - -    History Melissa Rubio has a past medical history of Anxiety, Cancer ([place]) (1994), Coronary artery disease, Coronary artery disease involving native coronary artery of native heart with angina pectoris ([place]), Diabetes ([place]), Hypertension, Itching, Myocardial infarction ([place]), RLS (restless legs syndrome), 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   She has a past surgical history that includes Partial hysterectomy (2009); Coronary/Graft Acute MI Revascularization (N/A, 09/01/2017); LEFT HEART CATH AND CORONARY ANGIOGRAPHY (N/A, 09/01/2017); CORONARY STENT INTERVENTION (N/A, 09/01/2017); Cardiac catheterization; Coronary artery bypass graft (N/A, 12/12/2017); and TEE without cardioversion (N/A, 12/12/2017).   Her  family history includes Cancer in her mother; Diabetes in her brother, sister, and sister; Heart attack in her father and sister.She reports that she has been smoking cigarettes. She started smoking about 38 years ago. She has a 26.25 pack-year smoking history. She has never used smokeless tobacco. She reports that she does not drink alcohol and does not use drugs.    ROS Review of Systems  Constitutional: Negative.   HENT: Negative.   Eyes: Negative for visual disturbance.  Respiratory: Negative for shortness of breath.   Cardiovascular: Negative for chest pain.  Gastrointestinal: Negative for abdominal pain.  Musculoskeletal: Negative for arthralgias.    Objective:  BP 121/81   Pulse 100   Temp 98 F (36.7 C) (Temporal)   Ht 5' (1.524 m)   Wt 169 lb (76.7 kg)   LMP 04/17/2014   BMI 33.01 kg/m   BP Readings from Last 3 Encounters:  03/11/20 121/81  03/02/19 130/78  11/19/18 124/84    Wt Readings from Last 3 Encounters:  03/11/20 169 lb (76.7 kg)  01/12/20 172 lb (78 kg)  12/03/19 173 lb (78.5 kg)     Physical Exam Constitutional:      General: She is not in acute distress.    Appearance: She is well-developed.  HENT:     Head: Normocephalic and atraumatic.  Eyes:     Conjunctiva/sclera: Conjunctivae normal.     Pupils: Pupils are equal, round, and reactive to light.  Neck:     Thyroid: No thyromegaly.  Cardiovascular:     Rate and Rhythm: Normal rate and regular rhythm.     Heart sounds: Normal heart  sounds. No murmur heard.   Pulmonary:     Effort: Pulmonary effort is normal. No respiratory distress.     Breath sounds: Normal breath sounds. No wheezing or rales.  Abdominal:     General: Bowel sounds are normal. There is no distension.     Palpations: Abdomen is soft.     Tenderness: There is no abdominal tenderness.  Musculoskeletal:        General: Normal range of motion.     Cervical back: Normal range of motion and neck supple.  Lymphadenopathy:      Cervical: No cervical adenopathy.  Skin:    General: Skin is warm and dry.  Neurological:     Mental Status: She is alert and oriented to person, place, and time.  Psychiatric:        Behavior: Behavior normal.        Thought Content: Thought content normal.        Judgment: Judgment normal.       Assessment & Plan:   Melissa Rubio was seen today for follow-up.  Diagnoses and all orders for this visit:  Type 2 diabetes mellitus with hyperglycemia, without long-term current use of insulin (HCC) -     CBC with Differential/Platelet -     Cancel: CMP14+EGFR -     Cancel: Thyroid Panel With TSH -     Bayer DCA Hb A1c Waived -     metFORMIN (GLUCOPHAGE) 1000 MG tablet; Take 1 tablet (1,000 mg total) by mouth 2 (two) times daily with a meal. -     INSULIN SYRINGE .5CC/29G 29G X 1/2" 0.5 ML MISC; 1 Syringe by Does not apply route 2 (two) times daily. -     insulin NPH Human (NOVOLIN N) 100 UNIT/ML injection; Inject 0.4 mLs (40 Units total) into the skin 2 (two) times daily before a meal. -     CMP14+EGFR  Essential hypertension -     CBC with Differential/Platelet -     Cancel: CMP14+EGFR -     Cancel: Thyroid Panel With TSH -     Bayer DCA Hb A1c Waived -     lisinopril (ZESTRIL) 2.5 MG tablet; Take 1 tablet (2.5 mg total) by mouth daily. -     CMP14+EGFR  Hyperlipidemia with target LDL less than 70 -     CBC with Differential/Platelet -     Cancel: CMP14+EGFR -     Cancel: Thyroid Panel With TSH -     Bayer DCA Hb A1c Waived  Chronic right-sided low back pain without sciatica  Chronic pain syndrome  Itch -     hydrOXYzine (ATARAX/VISTARIL) 50 MG tablet; Take 1 tablet (50 mg total) by mouth every 8 (eight) hours as needed.  Anxiety -     citalopram (CELEXA) 40 MG tablet; Take 1 tablet (40 mg total) by mouth daily. -     busPIRone (BUSPAR) 30 MG tablet; Take 1 tablet (30 mg total) by mouth 3 (three) times daily.  Postpartum depression -     Thyroid Panel With  TSH       I have changed Melissa Rubio's insulin NPH Human. I am also having her maintain her omeprazole, aspirin, ezetimibe, cyclobenzaprine, gabapentin, ALPRAZolam, ibuprofen, rosuvastatin, nitroGLYCERIN, isosorbide mononitrate, carvedilol, Oxycodone HCl, metFORMIN, lisinopril, INSULIN SYRINGE .5CC/29G, hydrOXYzine, citalopram, and busPIRone.  Allergies as of 03/11/2020   No Known Allergies     Medication List       Accurate as of March 11, 2020 11:59 PM. If  you have any questions, ask your nurse or doctor.        ALPRAZolam 1 MG tablet Commonly known as: XANAX Take 0.5 tablets (0.5 mg total) by mouth 3 (three) times daily as needed for anxiety.   aspirin 81 MG chewable tablet Chew 1 tablet (81 mg total) by mouth daily.   busPIRone 30 MG tablet Commonly known as: BUSPAR Take 1 tablet (30 mg total) by mouth 3 (three) times daily.   carvedilol 3.125 MG tablet Commonly known as: COREG Take 1 tablet (3.125 mg total) by mouth 2 (two) times daily with a meal.   citalopram 40 MG tablet Commonly known as: CELEXA Take 1 tablet (40 mg total) by mouth daily.   cyclobenzaprine 10 MG tablet Commonly known as: FLEXERIL Take 1 tablet (10 mg total) by mouth 3 (three) times daily as needed for muscle spasms.   ezetimibe 10 MG tablet Commonly known as: ZETIA TAKE 1 TABLET ONCE A DAY   gabapentin 800 MG tablet Commonly known as: Neurontin Take 1 tablet (800 mg total) by mouth 4 (four) times daily.   hydrOXYzine 50 MG tablet Commonly known as: ATARAX/VISTARIL Take 1 tablet (50 mg total) by mouth every 8 (eight) hours as needed. What changed: reasons to take this   ibuprofen 800 MG tablet Commonly known as: ADVIL Take 1 tablet (800 mg total) by mouth every 8 (eight) hours as needed.   insulin NPH Human 100 UNIT/ML injection Commonly known as: NOVOLIN N Inject 0.4 mLs (40 Units total) into the skin 2 (two) times daily before a meal. What changed:   how much to take  when  to take this  additional instructions   INSULIN SYRINGE .5CC/29G 29G X 1/2" 0.5 ML Misc 1 Syringe by Does not apply route 2 (two) times daily.   isosorbide mononitrate 30 MG 24 hr tablet Commonly known as: IMDUR Take 0.5 tablets (15 mg total) by mouth daily.   lisinopril 2.5 MG tablet Commonly known as: ZESTRIL Take 1 tablet (2.5 mg total) by mouth daily.   metFORMIN 1000 MG tablet Commonly known as: GLUCOPHAGE Take 1 tablet (1,000 mg total) by mouth 2 (two) times daily with a meal.   nitroGLYCERIN 0.4 MG SL tablet Commonly known as: NITROSTAT DISSOLVE ONE TABLET UNDER THE TONGUE EVERY 5 MINUTES AS NEEDED FOR CHEST PAIN.  DO NOT EXCEED A TOTAL OF 3 DOSES IN 15 MINUTES   omeprazole 20 MG capsule Commonly known as: PRILOSEC Take 20 mg by mouth daily.   Oxycodone HCl 10 MG Tabs Take by mouth.   rosuvastatin 40 MG tablet Commonly known as: CRESTOR TAKE 1 TABLET ONCE A DAY        Follow-up: No follow-ups on file.  Claretta Fraise, M.D.

## 2020-03-15 ENCOUNTER — Telehealth: Payer: Self-pay | Admitting: *Deleted

## 2020-03-15 MED ORDER — "INSULIN SYRINGE 30G X 1/2"" 0.5 ML MISC": 3 refills | Status: DC

## 2020-03-15 NOTE — Telephone Encounter (Signed)
29 g syringe no longer available, sent in 30 g syringe

## 2020-03-16 ENCOUNTER — Telehealth: Payer: Self-pay | Admitting: *Deleted

## 2020-03-16 MED ORDER — "INSULIN SYRINGE 31G X 5/16"" 0.5 ML MISC": 3 refills | Status: AC

## 2020-03-16 NOTE — Telephone Encounter (Signed)
30 g syringe no longer available, sent in 31 g syringe

## 2020-04-05 ENCOUNTER — Other Ambulatory Visit: Payer: Self-pay | Admitting: *Deleted

## 2020-04-05 DIAGNOSIS — G8929 Other chronic pain: Secondary | ICD-10-CM

## 2020-04-05 DIAGNOSIS — G894 Chronic pain syndrome: Secondary | ICD-10-CM

## 2020-04-05 MED ORDER — GABAPENTIN 800 MG PO TABS: 800.0000 mg | ORAL_TABLET | Freq: Four times a day (QID) | ORAL | 2 refills | Status: DC

## 2020-04-21 ENCOUNTER — Other Ambulatory Visit: Payer: Self-pay | Admitting: *Deleted

## 2020-04-21 DIAGNOSIS — G8929 Other chronic pain: Secondary | ICD-10-CM

## 2020-04-21 DIAGNOSIS — M545 Low back pain, unspecified: Secondary | ICD-10-CM

## 2020-04-21 MED ORDER — CYCLOBENZAPRINE HCL 10 MG PO TABS: 10.0000 mg | ORAL_TABLET | Freq: Three times a day (TID) | ORAL | 3 refills | Status: DC | PRN

## 2020-04-29 ENCOUNTER — Telehealth: Payer: Self-pay | Admitting: Cardiology

## 2020-04-29 NOTE — Telephone Encounter (Signed)
Pt c/o chest pain several times daily lasting 1 min or so rating pain 7/10 - denies SOB/dizziness/swelling - doesn't know what HR/BP has been - has upcoming appt 11/9 with Dr Harl Bowie - pt is out of town and wanting to know if she should increase Imdur to 30 mg daily

## 2020-04-29 NOTE — Telephone Encounter (Signed)
Per phone call from the patient- states that for the past 2 days she's been having angina attacks   Please call [phone]

## 2020-05-02 NOTE — Telephone Encounter (Signed)
Pt aware and will monitor symptoms - has f/u on 11/9 - updated medication list

## 2020-05-02 NOTE — Telephone Encounter (Signed)
Please increase imdur to 30mg  daily and keep [place] updated on symptoms  Zandra Abts MD

## 2020-05-17 ENCOUNTER — Ambulatory Visit (INDEPENDENT_AMBULATORY_CARE_PROVIDER_SITE_OTHER): Payer: Self-pay | Admitting: Cardiology

## 2020-05-17 ENCOUNTER — Encounter: Payer: Self-pay | Admitting: Cardiology

## 2020-05-17 VITALS — BP 110/80 | HR 88 | Ht 60.0 in | Wt 164.6 lb

## 2020-05-17 DIAGNOSIS — E782 Mixed hyperlipidemia: Secondary | ICD-10-CM

## 2020-05-17 DIAGNOSIS — I2510 Atherosclerotic heart disease of native coronary artery without angina pectoris: Secondary | ICD-10-CM

## 2020-05-17 NOTE — Patient Instructions (Signed)

## 2020-05-17 NOTE — Progress Notes (Addendum)
Clinical Summary Melissa Rubio is a 49 y.o.female seen today for follow up of the following medical problems.This is a focused visit on her history of CAD and recent chest pain.   1. 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 Full report as indicated below, received DES to RCA. LM 50%, LAD 65%, LCX 80% treated medically.  - 08/2017 echo LVEF 55-60%, inferior hypokinesis - medical therapy limited due to soft bp's during admission.  - referred to CT surgery given remaining LM/LAD and LCX disease -s/p CABG 12/12/17 with LIMA-LAD, SVG OM - changed from brillinta to plavix due to cost - 11/2017 echo LVEF 55-60%, no WMAs    - at prior visit reported some chest pain - sharp pain midchest. 7/10 in severity. Can occur at rest or with activity. +SOB +nausea -pain lasts 15-30 minutes - has taken NG with some benefit - limited to chronic back. Some DOE with acitivites which is chronic. - symptoms similar to chest pain prior MI.  - pain going on several months, no change in frequency or severity.  - not positional, no relation to food.   - 11/2019 nuclear stress: prior inferior infarction with mild peri-infarct ischemia. Low risk study - last visit we started imdur 15mg  daily.  - symptoms have with starting.   - has had some recent chest pains - symptoms resovled with imdur 30mg  daily.      3. Hyperlipidemia - 03/2018 TC 225 TG 236 HDL 33 LDL 145 - compliant with crestor and zetia.  01/2020 TC 122 TG 137 HDL 26 LDL 69  Has not covid vaccine, not in favor of it   Past Medical History:  Diagnosis Date  . Anxiety   . Cancer [facility]) 1994   cervical  . Coronary artery disease    2/19 PCI/DES x1 to dRCA with LM, LAD disease (planned for possible CABG in near future), normal EF  . Coronary artery disease involving native coronary artery of native heart with angina pectoris ([place])   . Diabetes ([place])   . Hypertension   . Itching   . Myocardial infarction ([place])   . RLS (restless legs  syndrome)   . 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 Tobacco use      No Known Allergies   Current Outpatient Medications  Medication Sig Dispense Refill  . ALPRAZolam (XANAX) 1 MG tablet Take 0.5 tablets (0.5 mg total) by mouth 3 (three) times daily as needed for anxiety. 60 tablet 5  . aspirin 81 MG chewable tablet Chew 1 tablet (81 mg total) by mouth daily.    . busPIRone (BUSPAR) 30 MG tablet Take 1 tablet (30 mg total) by mouth 3 (three) times daily. 90 tablet 0  . carvedilol (COREG) 3.125 MG tablet Take 1 tablet (3.125 mg total) by mouth 2 (two) times daily with a meal. 180 tablet 1  . citalopram (CELEXA) 40 MG tablet Take 1 tablet (40 mg total) by mouth daily. 90 tablet 0  . cyclobenzaprine (FLEXERIL) 10 MG tablet Take 1 tablet (10 mg total) by mouth 3 (three) times daily as needed for muscle spasms. 60 tablet 3  . ezetimibe (ZETIA) 10 MG tablet TAKE 1 TABLET ONCE A DAY 90 tablet 1  . gabapentin (NEURONTIN) 800 MG tablet Take 1 tablet (800 mg total) by mouth 4 (four) times daily. 120 tablet 2  . hydrOXYzine (ATARAX/VISTARIL) 50 MG tablet Take 1 tablet (50 mg total) by mouth every 8 (eight)  hours as needed. 90 tablet 0  . ibuprofen (ADVIL) 800 MG tablet Take 1 tablet (800 mg total) by mouth every 8 (eight) hours as needed. 90 tablet 1  . insulin NPH Human (NOVOLIN N) 100 UNIT/ML injection Inject 0.4 mLs (40 Units total) into the skin 2 (two) times daily before a meal. 30 mL 0  . Insulin Syringe-Needle U-100 (INSULIN SYRINGE .5CC/31GX5/16") 31G X 5/16" 0.5 ML MISC Use with insulin twice a day Dx E11.9 200 each 3  . isosorbide mononitrate (IMDUR) 30 MG 24 hr tablet Take 30 mg by mouth daily.    Marland Kitchen lisinopril (ZESTRIL) 2.5 MG tablet Take 1 tablet (2.5 mg total) by mouth daily. 90 tablet 0  . metFORMIN (GLUCOPHAGE) 1000 MG tablet Take 1 tablet (1,000 mg total) by mouth 2 (two) times daily with a meal. 180 tablet 0  . nitroGLYCERIN (NITROSTAT) 0.4 MG SL tablet DISSOLVE ONE TABLET  UNDER THE TONGUE EVERY 5 MINUTES AS NEEDED FOR CHEST PAIN.  DO NOT EXCEED A TOTAL OF 3 DOSES IN 15 MINUTES 25 tablet 3  . omeprazole (PRILOSEC) 20 MG capsule Take 20 mg by mouth daily.    . Oxycodone HCl 10 MG TABS Take by mouth.    . rosuvastatin (CRESTOR) 40 MG tablet TAKE 1 TABLET ONCE A DAY 90 tablet 2   No current facility-administered medications for this visit.     Past Surgical History:  Procedure Laterality Date  . CARDIAC CATHETERIZATION    . CORONARY ARTERY BYPASS GRAFT N/A 12/12/2017   Procedure: MEDIAN STERNOTOMY for CORONARY ARTERY BYPASS GRAFTING (CABG) x 2 (LIMA to LAD, SVG to OM) using RIGHT THIGH GREATER SAPHENOUS VEIN and LEFT INTERNAL MAMMARY ARTERY;  Surgeon: Rexene Alberts, MD;  Location: [facility];  Service: Open Heart Surgery;  Laterality: N/A;  . CORONARY STENT INTERVENTION N/A 09/01/2017   Procedure: CORONARY STENT INTERVENTION;  Surgeon: Martinique, Peter M, MD;  Location: [place] [facility];  Service: Cardiovascular;  Laterality: N/A;  . CORONARY/GRAFT ACUTE MI REVASCULARIZATION N/A 09/01/2017   Procedure: Coronary/Graft Acute MI Revascularization;  Surgeon: Martinique, Peter M, MD;  Location: [place] [facility];  Service: Cardiovascular;  Laterality: N/A;  . LEFT HEART CATH AND CORONARY ANGIOGRAPHY N/A 09/01/2017   Procedure: LEFT HEART CATH AND CORONARY ANGIOGRAPHY;  Surgeon: Martinique, Peter M, MD;  Location: [place] [facility];  Service: Cardiovascular;  Laterality: N/A;  . PARTIAL HYSTERECTOMY  2009  . TEE WITHOUT CARDIOVERSION N/A 12/12/2017   Procedure: TRANSESOPHAGEAL ECHOCARDIOGRAM (TEE);  Surgeon: Rexene Alberts, MD;  Location: [place];  Service: Open Heart Surgery;  Laterality: N/A;     No Known Allergies    Family History  Problem Relation Age of Onset  . Cancer Mother   . Heart attack Father   . Diabetes Sister   . Diabetes Brother   . Diabetes Sister   . Heart attack Sister      Social History Melissa Rubio reports that she has been smoking cigarettes. She  started smoking about 38 years ago. She has a 26.25 pack-year smoking history. She has never used smokeless tobacco. Melissa Rubio reports no history of alcohol use.   Review of Systems CONSTITUTIONAL: No weight loss, fever, chills, weakness or fatigue.  HEENT: Eyes: No visual loss, blurred vision, double vision or yellow sclerae.No hearing loss, sneezing, congestion, runny nose or sore throat.  SKIN: No rash or itching.  CARDIOVASCULAR: per hpi RESPIRATORY: No shortness of breath, cough or sputum.  GASTROINTESTINAL: No anorexia, nausea,  vomiting or diarrhea. No abdominal pain or blood.  GENITOURINARY: No burning on urination, no polyuria NEUROLOGICAL: No headache, dizziness, syncope, paralysis, ataxia, numbness or tingling in the extremities. No change in bowel or bladder control.  MUSCULOSKELETAL: No muscle, back pain, joint pain or stiffness.  LYMPHATICS: No enlarged nodes. No history of splenectomy.  PSYCHIATRIC: No history of depression or anxiety.  ENDOCRINOLOGIC: No reports of sweating, cold or heat intolerance. No polyuria or polydipsia.  Marland Kitchen   Physical Examination Today's Vitals   05/17/20 1133  BP: 110/80  Pulse: 88  SpO2: 98%  Weight: 164 lb 9.6 oz (74.7 kg)  Height: 5' (1.524 m)   Body mass index is 32.15 kg/m.  Gen: resting comfortably, no acute distress HEENT: no scleral icterus, pupils equal round and reactive, no palptable cervical adenopathy,  CV: RRR, no m/r/g, no jvd Resp: Clear to auscultation bilaterally GI: abdomen is soft, non-tender, non-distended, normal bowel sounds, no hepatosplenomegaly MSK: extremities are warm, no edema.  Skin: warm, no rash Neuro:  no focal deficits Psych: appropriate affect   Diagnostic Studies  11/2019 nuclear stress  There was no ST segment deviation noted during stress.  The left ventricular ejection fraction is hyperdynamic (>65%).  Findings consistent with prior myocardial infarction with mild peri-infarct  ischemia.  This is a low risk study. Elevated TID of unclear significance, may suggest somewhat higher risk   Assessment and Plan  1. CAD -priorinferior STEMI, s/p stenting. She hadresidual LM 50% and ostial LAD 65%, LCX 80%.She had a CABG for this remaining disease  -11/2019 nuclear stress with inferior infarct mild ischemia, low risk Recent chest pains symptoms have resolved on imdur 30mg  daily, continue medical therapy, room to titrate antianginals further if needed - EKG today shows SR, no ischemic changes  2.Hyperlipidemia - LDL at goal, continue crestor and zetia       Arnoldo Lenis, M.D.

## 2020-08-05 ENCOUNTER — Telehealth: Payer: Self-pay

## 2020-08-16 ENCOUNTER — Other Ambulatory Visit: Payer: Self-pay

## 2020-08-16 ENCOUNTER — Ambulatory Visit (INDEPENDENT_AMBULATORY_CARE_PROVIDER_SITE_OTHER): Payer: Self-pay | Admitting: Family Medicine

## 2020-08-16 ENCOUNTER — Encounter: Payer: Self-pay | Admitting: Family Medicine

## 2020-08-16 VITALS — BP 112/80 | HR 89 | Temp 97.8°F | Ht 60.0 in | Wt 168.0 lb

## 2020-08-16 DIAGNOSIS — E1140 Type 2 diabetes mellitus with diabetic neuropathy, unspecified: Secondary | ICD-10-CM

## 2020-08-16 DIAGNOSIS — I10 Essential (primary) hypertension: Secondary | ICD-10-CM

## 2020-08-16 DIAGNOSIS — Z01818 Encounter for other preprocedural examination: Secondary | ICD-10-CM

## 2020-08-16 DIAGNOSIS — F419 Anxiety disorder, unspecified: Secondary | ICD-10-CM

## 2020-08-16 DIAGNOSIS — K029 Dental caries, unspecified: Secondary | ICD-10-CM

## 2020-08-16 DIAGNOSIS — E785 Hyperlipidemia, unspecified: Secondary | ICD-10-CM

## 2020-08-16 DIAGNOSIS — L299 Pruritus, unspecified: Secondary | ICD-10-CM

## 2020-08-16 DIAGNOSIS — G8929 Other chronic pain: Secondary | ICD-10-CM

## 2020-08-16 DIAGNOSIS — E1165 Type 2 diabetes mellitus with hyperglycemia: Secondary | ICD-10-CM

## 2020-08-16 DIAGNOSIS — Z794 Long term (current) use of insulin: Secondary | ICD-10-CM

## 2020-08-16 DIAGNOSIS — M545 Low back pain, unspecified: Secondary | ICD-10-CM

## 2020-08-16 DIAGNOSIS — G894 Chronic pain syndrome: Secondary | ICD-10-CM

## 2020-08-16 LAB — BAYER DCA HB A1C WAIVED: HB A1C (BAYER DCA - WAIVED): >14 % — ABNORMAL HIGH (ref <7.0)

## 2020-08-16 MED ORDER — METFORMIN HCL 1000 MG PO TABS
1000.0000 mg | ORAL_TABLET | Freq: Two times a day (BID) | ORAL | 0 refills | Status: DC
Start: 2020-08-16 — End: 2021-05-18

## 2020-08-16 MED ORDER — LISINOPRIL 2.5 MG PO TABS: 2.5000 mg | ORAL_TABLET | Freq: Every day | ORAL | 0 refills | Status: DC

## 2020-08-16 MED ORDER — HYDROXYZINE HCL 50 MG PO TABS: 50.0000 mg | ORAL_TABLET | Freq: Three times a day (TID) | ORAL | 0 refills | Status: DC | PRN

## 2020-08-16 MED ORDER — GABAPENTIN 800 MG PO TABS
800.0000 mg | ORAL_TABLET | Freq: Four times a day (QID) | ORAL | 2 refills | Status: DC
Start: 2020-08-16 — End: 2021-03-10

## 2020-08-16 MED ORDER — BUSPIRONE HCL 30 MG PO TABS: 30.0000 mg | ORAL_TABLET | Freq: Three times a day (TID) | ORAL | 0 refills | Status: DC

## 2020-08-16 MED ORDER — INSULIN ASPART PROT & ASPART (70-30 MIX) 100 UNIT/ML ~~LOC~~ SUSP: SUBCUTANEOUS | 11 refills | Status: DC

## 2020-08-16 MED ORDER — CYCLOBENZAPRINE HCL 10 MG PO TABS
10.0000 mg | ORAL_TABLET | Freq: Three times a day (TID) | ORAL | 3 refills | Status: DC | PRN
Start: 2020-08-16 — End: 2021-05-18

## 2020-08-16 MED ORDER — CITALOPRAM HYDROBROMIDE 40 MG PO TABS: 40.0000 mg | ORAL_TABLET | Freq: Every day | ORAL | 0 refills | Status: DC

## 2020-08-16 NOTE — Progress Notes (Signed)
Subjective:  Patient ID: Melissa Rubio, female    DOB: 08/11/1970  Age: 50 y.o. MRN: [id number]  CC: Pre-op Exam (Dental procedure)   HPI Melissa Rubio presents forFollow-up of diabetes. Patient checks blood sugar at home.   high 100s fasting and over 200 postprandial Patient denies symptoms such as polyuria, polydipsia, excessive hunger, nausea No significant hypoglycemic spells noted. Medications reviewed. Pt reports taking them regularly without complication/adverse reaction being reported today.  She is also on tooth extractions.  She has had 3 already.  Her dentist commented that they were healing to slowly.  Her daughter told the dentist that her blood sugars might be contributing.  As result the patient was asked to have a surgical clearance performed.  She is here today for that.  She says that she needs another extraction right away because she still having pain.  Melissa Rubio is uninsured.  She is having to get her medicines as cheaply as possible on the [facility].  This is led to her using ReliOn NPH.  She is taking it at lunch and supper only.  She does not eat breakfast.  History Melissa Rubio has a past medical history of Anxiety, Cancer ([place]) (1994), Coronary artery disease, Coronary artery disease involving native coronary artery of native heart with angina pectoris ([place]), Diabetes ([place]), Hypertension, Itching, Myocardial infarction (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   She has a past surgical history that includes Partial hysterectomy (2009); Coronary/Graft Acute MI Revascularization (N/A, 09/01/2017); LEFT HEART CATH AND CORONARY ANGIOGRAPHY (N/A, 09/01/2017); CORONARY STENT INTERVENTION (N/A, 09/01/2017); Cardiac catheterization; Coronary artery bypass graft (N/A, 12/12/2017); and TEE without cardioversion (N/A, 12/12/2017).   Her family history includes Cancer in her mother; Diabetes in her brother, sister, and sister; Heart attack in her father and  sister.She reports that she has been smoking cigarettes. She started smoking about 38 years ago. She has a 26.25 pack-year smoking history. She has never used smokeless tobacco. She reports that she does not drink alcohol and does not use drugs.  Current Outpatient Medications on File Prior to Visit  Medication Sig Dispense Refill  . aspirin 81 MG chewable tablet Chew 1 tablet (81 mg total) by mouth daily.    . carvedilol (COREG) 3.125 MG tablet Take 1 tablet (3.125 mg total) by mouth 2 (two) times daily with a meal. 180 tablet 1  . ezetimibe (ZETIA) 10 MG tablet TAKE 1 TABLET ONCE A DAY 90 tablet 1  . ibuprofen (ADVIL) 800 MG tablet Take 1 tablet (800 mg total) by mouth every 8 (eight) hours as needed. 90 tablet 1  . Insulin Syringe-Needle U-100 (INSULIN SYRINGE .5CC/31GX5/16") 31G X 5/16" 0.5 ML MISC Use with insulin twice a day Dx E11.9 200 each 3  . isosorbide mononitrate (IMDUR) 30 MG 24 hr tablet Take 30 mg by mouth daily.    . nitroGLYCERIN (NITROSTAT) 0.4 MG SL tablet DISSOLVE ONE TABLET UNDER THE TONGUE EVERY 5 MINUTES AS NEEDED FOR CHEST PAIN.  DO NOT EXCEED A TOTAL OF 3 DOSES IN 15 MINUTES 25 tablet 3  . omeprazole (PRILOSEC) 20 MG capsule Take 20 mg by mouth daily.    . rosuvastatin (CRESTOR) 40 MG tablet TAKE 1 TABLET ONCE A DAY 90 tablet 2  . amoxicillin (AMOXIL) 500 MG capsule Take 500 mg by mouth every 8 (eight) hours.     No current facility-administered medications on file prior to visit.    ROS Review of Systems  Constitutional:  Negative.   HENT: Negative.   Eyes: Negative for visual disturbance.  Respiratory: Negative for shortness of breath.   Cardiovascular: Negative for chest pain.  Gastrointestinal: Negative for abdominal pain.  Musculoskeletal: Negative for arthralgias.    Objective:  BP 112/80   Pulse 89   Temp 97.8 F (36.6 C) (Temporal)   Ht 5' (1.524 m)   Wt 168 lb (76.2 kg)   LMP 04/17/2014   BMI 32.81 kg/m   BP Readings from Last 3 Encounters:   08/16/20 112/80  05/17/20 110/80  03/11/20 121/81    Wt Readings from Last 3 Encounters:  08/16/20 168 lb (76.2 kg)  05/17/20 164 lb 9.6 oz (74.7 kg)  03/11/20 169 lb (76.7 kg)     Physical Exam Constitutional:      General: She is not in acute distress.    Appearance: She is well-developed and well-nourished.  Cardiovascular:     Rate and Rhythm: Normal rate and regular rhythm.  Pulmonary:     Breath sounds: Normal breath sounds.  Skin:    General: Skin is warm and dry.  Neurological:     Mental Status: She is alert and oriented to person, place, and time.  Psychiatric:        Mood and Affect: Mood and affect normal.       Assessment & Plan:   Melissa Rubio was seen today for pre-op exam.  Diagnoses and all orders for this visit:  Type 2 diabetes mellitus with hyperglycemia, with long-term current use of insulin (HCC) -     CBC with Differential/Platelet -     CMP14+EGFR -     Bayer DCA Hb A1c Waived -     metFORMIN (GLUCOPHAGE) 1000 MG tablet; Take 1 tablet (1,000 mg total) by mouth 2 (two) times daily with a meal.  Essential hypertension -     CBC with Differential/Platelet -     CMP14+EGFR -     Bayer DCA Hb A1c Waived -     lisinopril (ZESTRIL) 2.5 MG tablet; Take 1 tablet (2.5 mg total) by mouth daily.  Hyperlipidemia with target LDL less than 70 -     CBC with Differential/Platelet -     CMP14+EGFR -     Bayer DCA Hb A1c Waived  Pre-operative clearance -     CBC with Differential/Platelet -     CMP14+EGFR -     Bayer DCA Hb A1c Waived  Itch -     hydrOXYzine (ATARAX/VISTARIL) 50 MG tablet; Take 1 tablet (50 mg total) by mouth every 8 (eight) hours as needed.  Chronic right-sided low back pain without sciatica -     gabapentin (NEURONTIN) 800 MG tablet; Take 1 tablet (800 mg total) by mouth 4 (four) times daily. -     cyclobenzaprine (FLEXERIL) 10 MG tablet; Take 1 tablet (10 mg total) by mouth 3 (three) times daily as needed for muscle  spasms.  Chronic pain syndrome -     gabapentin (NEURONTIN) 800 MG tablet; Take 1 tablet (800 mg total) by mouth 4 (four) times daily.  Anxiety -     citalopram (CELEXA) 40 MG tablet; Take 1 tablet (40 mg total) by mouth daily. -     busPIRone (BUSPAR) 30 MG tablet; Take 1 tablet (30 mg total) by mouth 3 (three) times daily.  Type 2 diabetes mellitus with diabetic neuropathy, with long-term current use of insulin ([place])  Dental decay  Other orders -     insulin aspart protamine- aspart (  NOVOLOG MIX 70/30 RELION) (70-30) 100 UNIT/ML injection; Use 60 units just before breakfast and 50 units just before supper   She will need to start eating a breakfast as discussed.  They would be in her best interest to reduce her A1c that she has 3 regular meals with controlled carbohydrates at each. For now the patient will continue her gabapentin for diabetic neuropathy.  She says her lower extremities have severe pain.  She is considering disability.  We discussed bringing her A1c down as her best strategy for reducing back pain.  She should continue the gabapentin as is.  I have discontinued Zowie Hockey's ALPRAZolam, Oxycodone HCl, and insulin NPH Human. I am also having her start on insulin aspart protamine- aspart. Additionally, I am having her maintain her omeprazole, aspirin, ezetimibe, ibuprofen, rosuvastatin, nitroGLYCERIN, carvedilol, INSULIN SYRINGE .5CC/31GX5/16", isosorbide mononitrate, amoxicillin, metFORMIN, lisinopril, hydrOXYzine, gabapentin, cyclobenzaprine, citalopram, and busPIRone.  Meds ordered this encounter  Medications  . insulin aspart protamine- aspart (NOVOLOG MIX 70/30 RELION) (70-30) 100 UNIT/ML injection    Sig: Use 60 units just before breakfast and 50 units just before supper    Dispense:  10 mL    Refill:  11  . metFORMIN (GLUCOPHAGE) 1000 MG tablet    Sig: Take 1 tablet (1,000 mg total) by mouth 2 (two) times daily with a meal.    Dispense:  180 tablet    Refill:  0   . lisinopril (ZESTRIL) 2.5 MG tablet    Sig: Take 1 tablet (2.5 mg total) by mouth daily.    Dispense:  90 tablet    Refill:  0    04/02/18 NEW  . hydrOXYzine (ATARAX/VISTARIL) 50 MG tablet    Sig: Take 1 tablet (50 mg total) by mouth every 8 (eight) hours as needed.    Dispense:  90 tablet    Refill:  0  . gabapentin (NEURONTIN) 800 MG tablet    Sig: Take 1 tablet (800 mg total) by mouth 4 (four) times daily.    Dispense:  120 tablet    Refill:  2  . cyclobenzaprine (FLEXERIL) 10 MG tablet    Sig: Take 1 tablet (10 mg total) by mouth 3 (three) times daily as needed for muscle spasms.    Dispense:  60 tablet    Refill:  3  . citalopram (CELEXA) 40 MG tablet    Sig: Take 1 tablet (40 mg total) by mouth daily.    Dispense:  90 tablet    Refill:  0  . busPIRone (BUSPAR) 30 MG tablet    Sig: Take 1 tablet (30 mg total) by mouth 3 (three) times daily.    Dispense:  90 tablet    Refill:  0     Follow-up: No follow-ups on file.  Claretta Fraise, M.D.

## 2020-08-17 ENCOUNTER — Telehealth: Payer: Self-pay

## 2020-08-17 LAB — CMP14+EGFR
ALT: 15 IU/L (ref 0–32)
AST: 12 IU/L (ref 0–40)
Albumin/Globulin Ratio: 1.7 (ref 1.2–2.2)
Albumin: 4.5 g/dL (ref 3.8–4.8)
Alkaline Phosphatase: 113 IU/L (ref 44–121)
BUN/Creatinine Ratio: 17 (ref 9–23)
BUN: 14 mg/dL (ref 6–24)
Bilirubin Total: <0.2 mg/dL (ref 0.0–1.2)
CO2: 22 mmol/L (ref 20–29)
Calcium: 9.8 mg/dL (ref 8.7–10.2)
Chloride: 100 mmol/L (ref 96–106)
Creatinine, Ser: 0.84 mg/dL (ref 0.57–1.00)
GFR calc Af Amer: 94 mL/min/{1.73_m2} (ref >59)
GFR calc non Af Amer: 82 mL/min/{1.73_m2} (ref >59)
Globulin, Total: 2.6 g/dL (ref 1.5–4.5)
Glucose: 256 mg/dL — ABNORMAL HIGH (ref 65–99)
Potassium: 5.3 mmol/L — ABNORMAL HIGH (ref 3.5–5.2)
Sodium: 137 mmol/L (ref 134–144)
Total Protein: 7.1 g/dL (ref 6.0–8.5)

## 2020-08-17 LAB — CBC WITH DIFFERENTIAL/PLATELET
Basophils Absolute: 0.1 10*3/uL (ref 0.0–0.2)
Basos: 0 %
EOS (ABSOLUTE): 0.3 10*3/uL (ref 0.0–0.4)
Eos: 2 %
Hematocrit: 48.1 % — ABNORMAL HIGH (ref 34.0–46.6)
Hemoglobin: 15.9 g/dL (ref 11.1–15.9)
Immature Grans (Abs): 0.1 10*3/uL (ref 0.0–0.1)
Immature Granulocytes: 1 %
Lymphocytes Absolute: 2.8 10*3/uL (ref 0.7–3.1)
Lymphs: 21 %
MCH: 29.1 pg (ref 26.6–33.0)
MCHC: 33.1 g/dL (ref 31.5–35.7)
MCV: 88 fL (ref 79–97)
Monocytes Absolute: 0.7 10*3/uL (ref 0.1–0.9)
Monocytes: 5 %
Neutrophils Absolute: 9.6 10*3/uL — ABNORMAL HIGH (ref 1.4–7.0)
Neutrophils: 71 %
Platelets: 264 10*3/uL (ref 150–450)
RBC: 5.46 x10E6/uL — ABNORMAL HIGH (ref 3.77–5.28)
RDW: 13.4 % (ref 11.7–15.4)
WBC: 13.4 10*3/uL — ABNORMAL HIGH (ref 3.4–10.8)

## 2020-08-17 NOTE — Telephone Encounter (Signed)
Patient said she was seen yesterday and there is a form she is waiting for you to fill out and have faxed back to her dentist for clearance for a tooth extraction.  She is calling to check on this.  Also, she said you had switched her insulin yesterday and she is unable to afford this.

## 2020-08-17 NOTE — Telephone Encounter (Signed)
She should go back to the old insulin. It's too bad because it isn't working very well. I sent the form to scheduling with a note that I needed to see her. I didn't get it back. I will check with them now, and send ASAP

## 2020-08-17 NOTE — Telephone Encounter (Signed)
Left message to call back  

## 2020-08-18 NOTE — Telephone Encounter (Signed)
Patient aware.

## 2020-08-28 ENCOUNTER — Encounter: Payer: Self-pay | Admitting: Family Medicine

## 2020-10-13 ENCOUNTER — Other Ambulatory Visit: Payer: Self-pay | Admitting: *Deleted

## 2020-10-13 DIAGNOSIS — G894 Chronic pain syndrome: Secondary | ICD-10-CM

## 2020-10-13 DIAGNOSIS — M545 Other chronic pain: Secondary | ICD-10-CM

## 2020-10-13 DIAGNOSIS — G8929 Other chronic pain: Secondary | ICD-10-CM

## 2020-10-13 MED ORDER — IBUPROFEN 800 MG PO TABS: 800.0000 mg | ORAL_TABLET | Freq: Three times a day (TID) | ORAL | 1 refills | Status: DC | PRN

## 2021-03-09 ENCOUNTER — Other Ambulatory Visit: Payer: Self-pay | Admitting: Family Medicine

## 2021-03-09 DIAGNOSIS — M545 Low back pain, unspecified: Secondary | ICD-10-CM

## 2021-03-09 DIAGNOSIS — G894 Chronic pain syndrome: Secondary | ICD-10-CM

## 2021-03-09 DIAGNOSIS — G8929 Other chronic pain: Secondary | ICD-10-CM

## 2021-05-18 ENCOUNTER — Ambulatory Visit (INDEPENDENT_AMBULATORY_CARE_PROVIDER_SITE_OTHER): Payer: Self-pay | Admitting: Family Medicine

## 2021-05-18 ENCOUNTER — Other Ambulatory Visit: Payer: Self-pay

## 2021-05-18 ENCOUNTER — Encounter: Payer: Self-pay | Admitting: Family Medicine

## 2021-05-18 VITALS — BP 121/83 | HR 109 | Temp 97.6°F | Ht 60.0 in | Wt 154.8 lb

## 2021-05-18 DIAGNOSIS — I10 Essential (primary) hypertension: Secondary | ICD-10-CM

## 2021-05-18 DIAGNOSIS — E785 Hyperlipidemia, unspecified: Secondary | ICD-10-CM

## 2021-05-18 DIAGNOSIS — G894 Chronic pain syndrome: Secondary | ICD-10-CM

## 2021-05-18 DIAGNOSIS — E1165 Type 2 diabetes mellitus with hyperglycemia: Secondary | ICD-10-CM

## 2021-05-18 DIAGNOSIS — M545 Low back pain, unspecified: Secondary | ICD-10-CM

## 2021-05-18 DIAGNOSIS — L299 Pruritus, unspecified: Secondary | ICD-10-CM

## 2021-05-18 DIAGNOSIS — F419 Anxiety disorder, unspecified: Secondary | ICD-10-CM

## 2021-05-18 DIAGNOSIS — I25119 Atherosclerotic heart disease of native coronary artery with unspecified angina pectoris: Secondary | ICD-10-CM

## 2021-05-18 DIAGNOSIS — E1065 Type 1 diabetes mellitus with hyperglycemia: Secondary | ICD-10-CM

## 2021-05-18 DIAGNOSIS — G8929 Other chronic pain: Secondary | ICD-10-CM

## 2021-05-18 DIAGNOSIS — Z794 Long term (current) use of insulin: Secondary | ICD-10-CM

## 2021-05-18 LAB — BAYER DCA HB A1C WAIVED: HB A1C (BAYER DCA - WAIVED): 11.9 % — ABNORMAL HIGH (ref 4.8–5.6)

## 2021-05-18 MED ORDER — EZETIMIBE 10 MG PO TABS: 10.0000 mg | ORAL_TABLET | Freq: Every day | ORAL | 3 refills | Status: DC

## 2021-05-18 MED ORDER — LISINOPRIL 2.5 MG PO TABS: 2.5000 mg | ORAL_TABLET | Freq: Every day | ORAL | 3 refills | Status: DC

## 2021-05-18 MED ORDER — BUSPIRONE HCL 30 MG PO TABS: 30.0000 mg | ORAL_TABLET | Freq: Three times a day (TID) | ORAL | 2 refills | Status: DC

## 2021-05-18 MED ORDER — NITROGLYCERIN 0.4 MG SL SUBL: SUBLINGUAL_TABLET | SUBLINGUAL | 11 refills | Status: DC

## 2021-05-18 MED ORDER — IBUPROFEN 800 MG PO TABS: 800.0000 mg | ORAL_TABLET | Freq: Three times a day (TID) | ORAL | 1 refills | Status: DC | PRN

## 2021-05-18 MED ORDER — GABAPENTIN 800 MG PO TABS
800.0000 mg | ORAL_TABLET | Freq: Four times a day (QID) | ORAL | 1 refills | Status: DC
Start: 2021-05-18 — End: 2021-11-22

## 2021-05-18 MED ORDER — ROSUVASTATIN CALCIUM 40 MG PO TABS: 40.0000 mg | ORAL_TABLET | Freq: Every day | ORAL | 3 refills | Status: DC

## 2021-05-18 MED ORDER — METFORMIN HCL 1000 MG PO TABS: 1000.0000 mg | ORAL_TABLET | Freq: Two times a day (BID) | ORAL | 2 refills | Status: DC

## 2021-05-18 MED ORDER — CARVEDILOL 3.125 MG PO TABS: 3.1250 mg | ORAL_TABLET | Freq: Two times a day (BID) | ORAL | 3 refills | Status: DC

## 2021-05-18 MED ORDER — CITALOPRAM HYDROBROMIDE 40 MG PO TABS: 40.0000 mg | ORAL_TABLET | Freq: Every day | ORAL | 2 refills | Status: DC

## 2021-05-18 MED ORDER — INSULIN ASPART PROT & ASPART (70-30 MIX) 100 UNIT/ML ~~LOC~~ SUSP: SUBCUTANEOUS | 11 refills | Status: DC

## 2021-05-18 MED ORDER — CYCLOBENZAPRINE HCL 10 MG PO TABS: 10.0000 mg | ORAL_TABLET | Freq: Three times a day (TID) | ORAL | 3 refills | Status: DC | PRN

## 2021-05-18 MED ORDER — HYDROXYZINE HCL 50 MG PO TABS
50.0000 mg | ORAL_TABLET | Freq: Three times a day (TID) | ORAL | 5 refills | Status: DC | PRN
Start: 2021-05-18 — End: 2021-11-22

## 2021-05-18 NOTE — Progress Notes (Addendum)
Subjective:  Patient ID: Melissa Rubio,  female    DOB: 10-29-1970  Age: 50 y.o.    CC: Medical Management of Chronic Issues   HPI Melissa Rubio presents for  follow-up of hypertension. Patient has no history of headache chest pain or shortness of breath or recent cough. Patient also denies symptoms of TIA such as numbness weakness lateralizing. Patient denies side effects from medication. States taking it regularly.  Patient also  in for follow-up of elevated cholesterol. Doing well without complaints on current medication. Denies side effects  including myalgia and arthralgia and nausea. Also in today for liver function testing. Currently no chest pain, shortness of breath or other cardiovascular related symptoms noted.  Follow-up of diabetes. Patient does check blood sugar at home. Readings run between 98 and 200 Patient denies symptoms such as excessive hunger or urinary frequency, excessive hunger, nausea No significant hypoglycemic spells noted. Medications reviewed. Pt reports taking them regularly. Pt. denies complication/adverse reaction today.   Exercise difficult due to joint pains. Plays with her grandson age 30  a lot. Can't do a long walk due to hip and back pain.   Feet and legs experiencing greater neuropathy pain that is interfering with pain. Burns & stings. 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   She has a past surgical history that includes Partial hysterectomy (2009); Coronary/Graft Acute MI Revascularization (N/A, 09/01/2017); LEFT HEART CATH AND CORONARY ANGIOGRAPHY (N/A, 09/01/2017); CORONARY STENT INTERVENTION (N/A, 09/01/2017); Cardiac catheterization; Coronary artery bypass  graft (N/A, 12/12/2017); and TEE without cardioversion (N/A, 12/12/2017).   Her family history includes Cancer in her mother; Diabetes in her brother, sister, and sister; Heart attack in her father and sister.She reports that she has been smoking cigarettes. She started smoking about 39 years ago. She has a 26.25 pack-year smoking history. She has never used smokeless tobacco. She reports that she does not drink alcohol and does not use drugs.  Current Outpatient Medications on File Prior to Visit  Medication Sig Dispense Refill   aspirin 81 MG chewable tablet Chew 1 tablet (81 mg total) by mouth daily.     hydrOXYzine (ATARAX/VISTARIL) 50 MG tablet Take 1 tablet (50 mg total) by mouth every 8 (eight) hours as needed. 90 tablet 0   Insulin Syringe-Needle U-100 (INSULIN SYRINGE .5CC/31GX5/16") 31G X 5/16" 0.5 ML MISC Use with insulin twice a day Dx E11.9 200 each 3   isosorbide mononitrate (IMDUR) 30 MG 24 hr tablet Take 30 mg by mouth daily.     nitroGLYCERIN (NITROSTAT) 0.4 MG SL tablet DISSOLVE ONE TABLET UNDER THE TONGUE EVERY 5 MINUTES AS NEEDED FOR CHEST PAIN.  DO NOT EXCEED A TOTAL OF 3 DOSES IN 15 MINUTES 25 tablet 3   omeprazole (PRILOSEC) 20 MG capsule Take 20 mg by mouth daily.     No current facility-administered medications on file prior to visit.    ROS Review of Systems  Constitutional: Negative.   HENT: Negative.    Eyes:  Negative for visual disturbance.  Respiratory:  Negative for shortness of breath.   Cardiovascular:  Negative for chest pain.  Gastrointestinal:  Negative for abdominal pain.  Musculoskeletal:  Negative for arthralgias.   Objective:  BP 121/83   Pulse (!) 109   Temp  97.6 F (36.4 C)   Ht 5' (1.524 m)   Wt 154 lb 12.8 oz (70.2 kg)   LMP 04/17/2014   SpO2 97%   BMI 30.23 kg/m   BP Readings from Last 3 Encounters:  05/18/21 121/83  08/16/20 112/80  05/17/20 110/80    Wt Readings from Last 3 Encounters:  05/18/21 154 lb 12.8 oz (70.2 kg)   08/16/20 168 lb (76.2 kg)  05/17/20 164 lb 9.6 oz (74.7 kg)     Physical Exam Constitutional:      General: She is not in acute distress.    Appearance: She is well-developed.  Cardiovascular:     Rate and Rhythm: Normal rate and regular rhythm.  Pulmonary:     Breath sounds: Normal breath sounds.  Musculoskeletal:        General: Normal range of motion.  Skin:    General: Skin is warm and dry.  Neurological:     Mental Status: She is alert and oriented to person, place, and time.    Diabetic Foot Exam - Simple   No data filed       Assessment & Plan:   Melissa Rubio was seen today for medical management of chronic issues.  Diagnoses and all orders for this visit:  Type 2 diabetes mellitus with hyperglycemia, with long-term current use of insulin ([place]) -     Bayer DCA Hb A1c Waived -     CBC with Differential/Platelet -     metFORMIN (GLUCOPHAGE) 1000 MG tablet; Take 1 tablet (1,000 mg total) by mouth 2 (two) times daily with a meal.  Essential hypertension -     CMP14+EGFR -     lisinopril (ZESTRIL) 2.5 MG tablet; Take 1 tablet (2.5 mg total) by mouth daily.  Hyperlipidemia with target LDL less than 70 -     Lipid panel  Anxiety -     busPIRone (BUSPAR) 30 MG tablet; Take 1 tablet (30 mg total) by mouth 3 (three) times daily. -     citalopram (CELEXA) 40 MG tablet; Take 1 tablet (40 mg total) by mouth daily.  Chronic right-sided low back pain without sciatica -     cyclobenzaprine (FLEXERIL) 10 MG tablet; Take 1 tablet (10 mg total) by mouth 3 (three) times daily as needed for muscle spasms. -     gabapentin (NEURONTIN) 800 MG tablet; Take 1 tablet (800 mg total) by mouth 4 (four) times daily. Plus 1/2 tablet extraat bedtime -     ibuprofen (ADVIL) 800 MG tablet; Take 1 tablet (800 mg total) by mouth every 8 (eight) hours as needed.  Chronic pain syndrome -     gabapentin (NEURONTIN) 800 MG tablet; Take 1 tablet (800 mg total) by mouth 4 (four) times daily. Plus  1/2 tablet extraat bedtime -     ibuprofen (ADVIL) 800 MG tablet; Take 1 tablet (800 mg total) by mouth every 8 (eight) hours as needed.  Coronary artery disease involving native coronary artery of native heart with angina pectoris (HCC) -     rosuvastatin (CRESTOR) 40 MG tablet; Take 1 tablet (40 mg total) by mouth daily.  Type 1 diabetes mellitus with hyperglycemia (HCC) -     rosuvastatin (CRESTOR) 40 MG tablet; Take 1 tablet (40 mg total) by mouth daily.  Other orders -     carvedilol (COREG) 3.125 MG tablet; Take 1 tablet (3.125 mg total) by mouth 2 (two) times daily with a meal. -     ezetimibe (ZETIA) 10 MG  tablet; Take 1 tablet (10 mg total) by mouth daily. -     insulin aspart protamine- aspart (NOVOLOG MIX 70/30 RELION) (70-30) 100 UNIT/ML injection; Use 70 units just before breakfast and 50 units just before supper  I have discontinued Steph Standifer's amoxicillin. I have also changed her ezetimibe, gabapentin, insulin aspart protamine- aspart, and rosuvastatin. Additionally, I am having her maintain her omeprazole, aspirin, nitroGLYCERIN, INSULIN SYRINGE .5CC/31GX5/16", isosorbide mononitrate, hydrOXYzine, busPIRone, carvedilol, citalopram, cyclobenzaprine, ibuprofen, metFORMIN, and lisinopril.  Meds ordered this encounter  Medications   busPIRone (BUSPAR) 30 MG tablet    Sig: Take 1 tablet (30 mg total) by mouth 3 (three) times daily.    Dispense:  90 tablet    Refill:  2   carvedilol (COREG) 3.125 MG tablet    Sig: Take 1 tablet (3.125 mg total) by mouth 2 (two) times daily with a meal.    Dispense:  180 tablet    Refill:  3   citalopram (CELEXA) 40 MG tablet    Sig: Take 1 tablet (40 mg total) by mouth daily.    Dispense:  90 tablet    Refill:  2   cyclobenzaprine (FLEXERIL) 10 MG tablet    Sig: Take 1 tablet (10 mg total) by mouth 3 (three) times daily as needed for muscle spasms.    Dispense:  60 tablet    Refill:  3   ezetimibe (ZETIA) 10 MG tablet    Sig: Take 1  tablet (10 mg total) by mouth daily.    Dispense:  90 tablet    Refill:  3   gabapentin (NEURONTIN) 800 MG tablet    Sig: Take 1 tablet (800 mg total) by mouth 4 (four) times daily. Plus 1/2 tablet extraat bedtime    Dispense:  405 tablet    Refill:  1   ibuprofen (ADVIL) 800 MG tablet    Sig: Take 1 tablet (800 mg total) by mouth every 8 (eight) hours as needed.    Dispense:  90 tablet    Refill:  1   insulin aspart protamine- aspart (NOVOLOG MIX 70/30 RELION) (70-30) 100 UNIT/ML injection    Sig: Use 70 units just before breakfast and 50 units just before supper    Dispense:  10 mL    Refill:  11   metFORMIN (GLUCOPHAGE) 1000 MG tablet    Sig: Take 1 tablet (1,000 mg total) by mouth 2 (two) times daily with a meal.    Dispense:  180 tablet    Refill:  2   rosuvastatin (CRESTOR) 40 MG tablet    Sig: Take 1 tablet (40 mg total) by mouth daily.    Dispense:  90 tablet    Refill:  3   lisinopril (ZESTRIL) 2.5 MG tablet    Sig: Take 1 tablet (2.5 mg total) by mouth daily.    Dispense:  90 tablet    Refill:  3    04/02/18 NEW     Follow-up: No follow-ups on file.  Claretta Fraise, M.D.

## 2021-05-18 NOTE — Patient Instructions (Signed)

## 2021-05-19 LAB — CBC WITH DIFFERENTIAL/PLATELET
Basophils Absolute: 0.1 10*3/uL (ref 0.0–0.2)
Basos: 1 %
EOS (ABSOLUTE): 0.2 10*3/uL (ref 0.0–0.4)
Eos: 2 %
Hematocrit: 46.8 % — ABNORMAL HIGH (ref 34.0–46.6)
Hemoglobin: 15.5 g/dL (ref 11.1–15.9)
Immature Grans (Abs): 0 10*3/uL (ref 0.0–0.1)
Immature Granulocytes: 0 %
Lymphocytes Absolute: 2.6 10*3/uL (ref 0.7–3.1)
Lymphs: 28 %
MCH: 28.3 pg (ref 26.6–33.0)
MCHC: 33.1 g/dL (ref 31.5–35.7)
MCV: 85 fL (ref 79–97)
Monocytes Absolute: 0.6 10*3/uL (ref 0.1–0.9)
Monocytes: 7 %
Neutrophils Absolute: 5.7 10*3/uL (ref 1.4–7.0)
Neutrophils: 62 %
Platelets: 317 10*3/uL (ref 150–450)
RBC: 5.48 x10E6/uL — ABNORMAL HIGH (ref 3.77–5.28)
RDW: 13.1 % (ref 11.7–15.4)
WBC: 9.1 10*3/uL (ref 3.4–10.8)

## 2021-05-19 LAB — CMP14+EGFR
ALT: 14 IU/L (ref 0–32)
AST: 14 IU/L (ref 0–40)
Albumin/Globulin Ratio: 1.4 (ref 1.2–2.2)
Albumin: 3.9 g/dL (ref 3.8–4.8)
Alkaline Phosphatase: 104 IU/L (ref 44–121)
BUN/Creatinine Ratio: 14 (ref 9–23)
BUN: 9 mg/dL (ref 6–24)
Bilirubin Total: 0.2 mg/dL (ref 0.0–1.2)
CO2: 21 mmol/L (ref 20–29)
Calcium: 9.4 mg/dL (ref 8.7–10.2)
Chloride: 108 mmol/L — ABNORMAL HIGH (ref 96–106)
Creatinine, Ser: 0.65 mg/dL (ref 0.57–1.00)
Globulin, Total: 2.8 g/dL (ref 1.5–4.5)
Glucose: 156 mg/dL — ABNORMAL HIGH (ref 70–99)
Potassium: 4.5 mmol/L (ref 3.5–5.2)
Sodium: 143 mmol/L (ref 134–144)
Total Protein: 6.7 g/dL (ref 6.0–8.5)
eGFR: 107 mL/min/{1.73_m2} (ref >59)

## 2021-05-19 LAB — LIPID PANEL
Chol/HDL Ratio: 11.4 ratio — ABNORMAL HIGH (ref 0.0–4.4)
Cholesterol, Total: 297 mg/dL — ABNORMAL HIGH (ref 100–199)
HDL: 26 mg/dL — ABNORMAL LOW (ref >39)
LDL Chol Calc (NIH): 225 mg/dL — ABNORMAL HIGH (ref 0–99)
Triglycerides: 228 mg/dL — ABNORMAL HIGH (ref 0–149)
VLDL Cholesterol Cal: 46 mg/dL — ABNORMAL HIGH (ref 5–40)

## 2021-05-24 ENCOUNTER — Encounter: Payer: Self-pay | Admitting: Cardiology

## 2021-05-24 ENCOUNTER — Ambulatory Visit (INDEPENDENT_AMBULATORY_CARE_PROVIDER_SITE_OTHER): Payer: Self-pay | Admitting: Cardiology

## 2021-05-24 VITALS — BP 120/80 | HR 92 | Ht 60.0 in | Wt 156.4 lb

## 2021-05-24 DIAGNOSIS — E782 Mixed hyperlipidemia: Secondary | ICD-10-CM

## 2021-05-24 DIAGNOSIS — I2510 Atherosclerotic heart disease of native coronary artery without angina pectoris: Secondary | ICD-10-CM

## 2021-05-24 NOTE — Progress Notes (Signed)
Clinical Summary Melissa Rubio is a 50 y.o.female seen today for follow up of the following medical problems.   1. 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 Full report as indicated below, received DES to RCA. LM 50%, LAD 65%, LCX 80% treated medically.  - 08/2017 echo LVEF 55-60%, inferior hypokinesis - medical therapy limited due to soft bp's during admission.    - referred to CT surgery given remaining LM/LAD and LCX disease - s/p CABG 12/12/17 with LIMA-LAD, SVG OM - changed from brillinta to plavix due to cost - 11/2017 echo LVEF 55-60%, no WMAs        - 11/2019 nuclear stress: prior inferior infarction with mild peri-infarct ischemia. Low risk study - last visit we started imdur 15mg  daily.     - symptoms resovled with imdur 30mg  daily.  - no recent chest pains. Some SOB at times, better with prn albuterol - compliant with meds       3. Hyperlipidemia - 03/2018 TC 225 TG 236 HDL 33 LDL 145 01/2020 TC 122 TG 137 HDL 26 LDL 69   05/2021 TC 297 TG 228 HDL 26 LDL 225 (had been off crestor and zetia for period)    4. PAD - prior to CABG ABI right 0.62, left 1.12 - no recent symptoms.     5. 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     busPIRone (BUSPAR) 30 MG tablet Take 1 tablet (30 mg total) by mouth 3 (three) times daily. 90 tablet 2    carvedilol (COREG) 3.125 MG tablet Take 1 tablet (3.125 mg total) by mouth 2 (two) times daily with a meal. 180 tablet 3   citalopram (CELEXA) 40 MG tablet Take 1 tablet (40 mg total) by mouth daily. 90 tablet 2   cyclobenzaprine (FLEXERIL) 10 MG tablet Take 1 tablet (10 mg total) by mouth 3 (three) times daily as needed for muscle spasms. 60 tablet 3   ezetimibe (ZETIA) 10 MG tablet Take 1 tablet (10 mg total) by mouth daily. 90 tablet 3   gabapentin (NEURONTIN) 800 MG tablet Take 1 tablet (800 mg total) by mouth 4 (four) times daily. Plus 1/2 tablet extraat bedtime 405 tablet 1   hydrOXYzine (ATARAX/VISTARIL) 50 MG tablet Take 1 tablet (50 mg total) by mouth every 8 (eight) hours as needed. 90 tablet 5   ibuprofen (ADVIL) 800 MG tablet Take 1 tablet (800 mg total) by mouth every 8 (eight) hours as needed. 90 tablet 1   insulin aspart protamine- aspart (NOVOLOG MIX 70/30 RELION) (70-30) 100 UNIT/ML injection Use 70 units just before breakfast and 50 units just before supper 10 mL 11   Insulin Syringe-Needle U-100 (  INSULIN SYRINGE .5CC/31GX5/16") 31G X 5/16" 0.5 ML MISC Use with insulin twice a day Dx E11.9 200 each 3   isosorbide mononitrate (IMDUR) 30 MG 24 hr tablet Take 30 mg by mouth daily.     lisinopril (ZESTRIL) 2.5 MG tablet Take 1 tablet (2.5 mg total) by mouth daily. 90 tablet 3   metFORMIN (GLUCOPHAGE) 1000 MG tablet Take 1 tablet (1,000 mg total) by mouth 2 (two) times daily with a meal. 180 tablet 2   nitroGLYCERIN (NITROSTAT) 0.4 MG SL tablet DISSOLVE ONE TABLET UNDER THE TONGUE EVERY 5 MINUTES AS NEEDED FOR CHEST PAIN.  DO NOT EXCEED A TOTAL OF 3 DOSES IN 15 MINUTES 25 tablet 11   omeprazole (PRILOSEC) 20 MG capsule Take 20 mg by mouth daily.     rosuvastatin (CRESTOR) 40 MG tablet Take 1 tablet (40 mg total) by mouth daily. 90 tablet 3   No current facility-administered medications for this visit.     Past Surgical History:  Procedure Laterality Date   CARDIAC CATHETERIZATION      CORONARY ARTERY BYPASS GRAFT N/A 12/12/2017   Procedure: MEDIAN STERNOTOMY for CORONARY ARTERY BYPASS GRAFTING (CABG) x 2 (LIMA to LAD, SVG to OM) using RIGHT THIGH GREATER SAPHENOUS VEIN and LEFT INTERNAL MAMMARY ARTERY;  Surgeon: Rexene Alberts, MD;  Location: [facility];  Service: Open Heart Surgery;  Laterality: N/A;   CORONARY STENT INTERVENTION N/A 09/01/2017   Procedure: CORONARY STENT INTERVENTION;  Surgeon: Martinique, Peter M, MD;  Location: [place] [facility];  Service: Cardiovascular;  Laterality: N/A;   CORONARY/GRAFT ACUTE MI REVASCULARIZATION N/A 09/01/2017   Procedure: Coronary/Graft Acute MI Revascularization;  Surgeon: Martinique, Peter M, MD;  Location: [place] [facility];  Service: Cardiovascular;  Laterality: N/A;   LEFT HEART CATH AND CORONARY ANGIOGRAPHY N/A 09/01/2017   Procedure: LEFT HEART CATH AND CORONARY ANGIOGRAPHY;  Surgeon: Martinique, Peter M, MD;  Location: [facility] [facility];  Service: Cardiovascular;  Laterality: N/A;   PARTIAL HYSTERECTOMY  2009   TEE WITHOUT CARDIOVERSION N/A 12/12/2017   Procedure: TRANSESOPHAGEAL ECHOCARDIOGRAM (TEE);  Surgeon: Rexene Alberts, MD;  Location: [facility];  Service: Open Heart Surgery;  Laterality: N/A;     No Known Allergies    Family History  Problem Relation Age of Onset   Cancer Mother    Heart attack Father    Diabetes Sister    Diabetes Brother    Diabetes Sister    Heart attack Sister      Social History Melissa Rubio reports that she has been smoking cigarettes. She started smoking about 39 years ago. She has a 26.25 pack-year smoking history. She has never used smokeless tobacco. Melissa Rubio reports no history of alcohol use.   Review of Systems CONSTITUTIONAL: No weight loss, fever, chills, weakness or fatigue.  HEENT: Eyes: No visual loss, blurred vision, double vision or yellow sclerae.No hearing loss, sneezing, congestion, runny nose or sore throat.  SKIN: No rash or itching.  CARDIOVASCULAR: per hpi RESPIRATORY: No  shortness of breath, cough or sputum.  GASTROINTESTINAL: No anorexia, nausea, vomiting or diarrhea. No abdominal pain or blood.  GENITOURINARY: No burning on urination, no polyuria NEUROLOGICAL: No headache, dizziness, syncope, paralysis, ataxia, numbness or tingling in the extremities. No change in bowel or bladder control.  MUSCULOSKELETAL: No muscle, back pain, joint pain or stiffness.  LYMPHATICS: No enlarged nodes. No history of splenectomy.  PSYCHIATRIC: No history of depression or anxiety.  ENDOCRINOLOGIC: No reports of sweating, cold or heat intolerance.  No polyuria or polydipsia.  Marland Kitchen   Physical Examination Today's Vitals   05/24/21 1523  BP: 120/80  Pulse: 92  SpO2: 98%  Weight: 156 lb 6.4 oz (70.9 kg)  Height: 5' (1.524 m)   Body mass index is 30.54 kg/m.  Gen: resting comfortably, no acute distress HEENT: no scleral icterus, pupils equal round and reactive, no palptable cervical adenopathy,  CV: RRR, no m/r/g no jvd Resp: Clear to auscultation bilaterally GI: abdomen is soft, non-tender, non-distended, normal bowel sounds, no hepatosplenomegaly MSK: extremities are warm, no edema.  Skin: warm, no rash Neuro:  no focal deficits Psych: appropriate affect   Diagnostic Studies 11/2019 nuclear stress There was no ST segment deviation noted during stress. The left ventricular ejection fraction is hyperdynamic (>65%). Findings consistent with prior myocardial infarction with mild peri-infarct ischemia. This is a low risk study. Elevated TID of unclear significance, may suggest somewhat higher risk    Assessment and Plan  1. CAD - prior inferior STEMI, s/p stenting. She had residual LM 50% and ostial LAD 65%, LCX 80%. She  had a CABG for this remaining disease   -11/2019 nuclear stress with inferior infarct mild ischemia, low risk -prior chest pains resolved with imdur - continue current meds - EKG today SR, no acute ischemic changes    2.Hyperlipidemia -significant elevation in LDL, she had run out of crestor and zetia for a period, now back on - continue to monitor.       Arnoldo Lenis, M.D.

## 2021-05-24 NOTE — Patient Instructions (Signed)

## 2021-11-15 ENCOUNTER — Encounter: Payer: Self-pay | Admitting: Family Medicine

## 2021-11-15 ENCOUNTER — Ambulatory Visit (INDEPENDENT_AMBULATORY_CARE_PROVIDER_SITE_OTHER): Payer: Self-pay | Admitting: Family Medicine

## 2021-11-15 VITALS — BP 145/83 | HR 94 | Temp 98.2°F | Ht 60.0 in | Wt 160.8 lb

## 2021-11-15 DIAGNOSIS — E785 Hyperlipidemia, unspecified: Secondary | ICD-10-CM

## 2021-11-15 DIAGNOSIS — I10 Essential (primary) hypertension: Secondary | ICD-10-CM

## 2021-11-15 DIAGNOSIS — Z794 Long term (current) use of insulin: Secondary | ICD-10-CM

## 2021-11-15 DIAGNOSIS — E1165 Type 2 diabetes mellitus with hyperglycemia: Secondary | ICD-10-CM

## 2021-11-15 MED ORDER — TRAZODONE HCL 150 MG PO TABS
ORAL_TABLET | ORAL | 5 refills | Status: AC
Start: 2021-11-15 — End: ?

## 2021-11-15 NOTE — Progress Notes (Signed)
? ? ?Subjective:  ?Patient ID: Melissa Rubio, female    DOB: 1971-04-12  Age: 51 y.o. MRN: [id number] ? ?CC: Medical Management of Chronic Issues ? ? ?HPI ?Melissa Rubio presents forFollow-up of diabetes. Patient checks blood sugar at home.  ? 104 fasting and 120s postprandial ?Patient denies symptoms such as polyuria, polydipsia, excessive hunger, nausea ?No significant hypoglycemic spells noted. ?Medications reviewed. Pt reports taking them regularly without complication/adverse reaction being reported today.  ? ?Frequent problems sleeping. 1-2 hours a night. Making her tired & moody. DEcreasing her energy. OTCs not helping.  ? ? ?History ?Melissa Rubio has a past medical history of Anxiety, Cancer ([place]) (1994), Coronary artery disease, Coronary artery disease involving native coronary artery of native heart with angina pectoris ([place]), Diabetes ([place]), Hypertension, Itching, Myocardial infarction ([place]), RLS (restless legs syndrome), 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  ? ?She has a past surgical history that includes Partial hysterectomy (2009); Coronary/Graft Acute MI Revascularization (N/A, 09/01/2017); LEFT HEART CATH AND CORONARY ANGIOGRAPHY (N/A, 09/01/2017); CORONARY STENT INTERVENTION (N/A, 09/01/2017); Cardiac catheterization; Coronary artery bypass graft (N/A, 12/12/2017); and TEE without cardioversion (N/A, 12/12/2017).  ? ?Her family history includes Cancer in her mother; Diabetes in her brother, sister, and sister; Heart attack in her father and sister.She reports that she has been smoking cigarettes. She started smoking about 40 years ago. She has a 26.25 pack-year smoking history. She has never used smokeless tobacco. She reports that she does not drink alcohol and does not use drugs. ? ?Current Outpatient Medications on File Prior to Visit  ?Medication Sig Dispense Refill  ? aspirin 81 MG chewable tablet Chew 1 tablet (81 mg total) by mouth daily.    ? busPIRone (BUSPAR) 30 MG tablet Take 1 tablet (30 mg total)  by mouth 3 (three) times daily. 90 tablet 2  ? carvedilol (COREG) 3.125 MG tablet Take 1 tablet (3.125 mg total) by mouth 2 (two) times daily with a meal. 180 tablet 3  ? citalopram (CELEXA) 40 MG tablet Take 1 tablet (40 mg total) by mouth daily. 90 tablet 2  ? cyclobenzaprine (FLEXERIL) 10 MG tablet Take 1 tablet (10 mg total) by mouth 3 (three) times daily as needed for muscle spasms. 60 tablet 3  ? ezetimibe (ZETIA) 10 MG tablet Take 1 tablet (10 mg total) by mouth daily. 90 tablet 3  ? gabapentin (NEURONTIN) 800 MG tablet Take 1 tablet (800 mg total) by mouth 4 (four) times daily. Plus 1/2 tablet extraat bedtime 405 tablet 1  ? hydrOXYzine (ATARAX/VISTARIL) 50 MG tablet Take 1 tablet (50 mg total) by mouth every 8 (eight) hours as needed. 90 tablet 5  ? ibuprofen (ADVIL) 800 MG tablet Take 1 tablet (800 mg total) by mouth every 8 (eight) hours as needed. 90 tablet 1  ? insulin aspart protamine- aspart (NOVOLOG MIX 70/30 RELION) (70-30) 100 UNIT/ML injection Use 70 units just before breakfast and 50 units just before supper 10 mL 11  ? Insulin Syringe-Needle U-100 (INSULIN SYRINGE .5CC/31GX5/16") 31G X 5/16" 0.5 ML MISC Use with insulin twice a day Dx E11.9 200 each 3  ? isosorbide mononitrate (IMDUR) 30 MG 24 hr tablet Take 30 mg by mouth daily.    ? metFORMIN (GLUCOPHAGE) 1000 MG tablet Take 1 tablet (1,000 mg total) by mouth 2 (two) times daily with a meal. 180 tablet 2  ? nitroGLYCERIN (NITROSTAT) 0.4 MG SL tablet DISSOLVE ONE TABLET UNDER THE TONGUE EVERY 5 MINUTES AS NEEDED FOR CHEST PAIN.  DO NOT  EXCEED A TOTAL OF 3 DOSES IN 15 MINUTES 25 tablet 11  ? omeprazole (PRILOSEC) 20 MG capsule Take 20 mg by mouth daily.    ? rosuvastatin (CRESTOR) 40 MG tablet Take 1 tablet (40 mg total) by mouth daily. 90 tablet 3  ? lisinopril (ZESTRIL) 2.5 MG tablet Take 1 tablet (2.5 mg total) by mouth daily. 90 tablet 3  ? ?No current facility-administered medications on file prior to visit.  ? ? ?ROS ?Review of Systems   ?Constitutional: Negative.   ?HENT: Negative.    ?Eyes:  Negative for visual disturbance.  ?Respiratory:  Negative for shortness of breath.   ?Cardiovascular:  Negative for chest pain.  ?Gastrointestinal:  Negative for abdominal pain.  ?Musculoskeletal:  Negative for arthralgias.  ? ?Objective:  ?BP (!) 145/83   Pulse 94   Temp 98.2 ?F (36.8 ?C)   Ht 5' (1.524 m)   Wt 160 lb 12.8 oz (72.9 kg)   LMP 04/17/2014   SpO2 95%   BMI 31.40 kg/m?  ? ?BP Readings from Last 3 Encounters:  ?11/15/21 (!) 145/83  ?05/24/21 120/80  ?05/18/21 121/83  ? ? ?Wt Readings from Last 3 Encounters:  ?11/15/21 160 lb 12.8 oz (72.9 kg)  ?05/24/21 156 lb 6.4 oz (70.9 kg)  ?05/18/21 154 lb 12.8 oz (70.2 kg)  ? ? ? ?Physical Exam ?Constitutional:   ?   General: She is not in acute distress. ?   Appearance: She is well-developed.  ?Cardiovascular:  ?   Rate and Rhythm: Normal rate and regular rhythm.  ?Pulmonary:  ?   Breath sounds: Normal breath sounds.  ?Musculoskeletal:     ?   General: Normal range of motion.  ?Skin: ?   General: Skin is warm and dry.  ?Neurological:  ?   Mental Status: She is alert and oriented to person, place, and time.  ? ? ? ? ?Assessment & Plan:  ? ?Melissa Rubio was seen today for medical management of chronic issues. ? ?Diagnoses and all orders for this visit: ? ?Type 2 diabetes mellitus with hyperglycemia, with long-term current use of insulin ([place]) ?-     Bayer DCA Hb A1c Waived ?-     CBC with Differential/Platelet ?-     CMP14+EGFR ? ?Essential hypertension ?-     CBC with Differential/Platelet ?-     CMP14+EGFR ? ?Hyperlipidemia with target LDL less than 70 ?-     Lipid panel ? ?Other orders ?-     traZODone (DESYREL) 150 MG tablet; Use from 1/3 to 1 tablet nightly as needed for sleep. ? ? ? ? ? ?I am having Melissa Rubio start on traZODone. I am also having her maintain her omeprazole, aspirin, INSULIN SYRINGE .5CC/31GX5/16", isosorbide mononitrate, busPIRone, carvedilol, citalopram, cyclobenzaprine, ezetimibe,  gabapentin, ibuprofen, insulin aspart protamine- aspart, metFORMIN, rosuvastatin, lisinopril, hydrOXYzine, and nitroGLYCERIN. ? ?Meds ordered this encounter  ?Medications  ? traZODone (DESYREL) 150 MG tablet  ?  Sig: Use from 1/3 to 1 tablet nightly as needed for sleep.  ?  Dispense:  30 tablet  ?  Refill:  5  ? ? ? ?Follow-up: Return in about 6 months (around 05/18/2022). ? ?Claretta Fraise, M.D. ?

## 2021-11-16 LAB — CMP14+EGFR
ALT: 15 IU/L (ref 0–32)
AST: 17 IU/L (ref 0–40)
Albumin/Globulin Ratio: 1.5 (ref 1.2–2.2)
Albumin: 4.3 g/dL (ref 3.8–4.8)
Alkaline Phosphatase: 97 IU/L (ref 44–121)
BUN/Creatinine Ratio: 9 (ref 9–23)
BUN: 7 mg/dL (ref 6–24)
Bilirubin Total: 0.2 mg/dL (ref 0.0–1.2)
CO2: 23 mmol/L (ref 20–29)
Calcium: 9.7 mg/dL (ref 8.7–10.2)
Chloride: 103 mmol/L (ref 96–106)
Creatinine, Ser: 0.8 mg/dL (ref 0.57–1.00)
Globulin, Total: 2.8 g/dL (ref 1.5–4.5)
Glucose: 133 mg/dL — ABNORMAL HIGH (ref 70–99)
Potassium: 5.1 mmol/L (ref 3.5–5.2)
Sodium: 141 mmol/L (ref 134–144)
Total Protein: 7.1 g/dL (ref 6.0–8.5)
eGFR: 90 mL/min/{1.73_m2} (ref >59)

## 2021-11-16 LAB — CBC WITH DIFFERENTIAL/PLATELET
Basophils Absolute: 0.1 10*3/uL (ref 0.0–0.2)
Basos: 0 %
EOS (ABSOLUTE): 0.2 10*3/uL (ref 0.0–0.4)
Eos: 2 %
Hematocrit: 46.3 % (ref 34.0–46.6)
Hemoglobin: 15.4 g/dL (ref 11.1–15.9)
Immature Grans (Abs): 0 10*3/uL (ref 0.0–0.1)
Immature Granulocytes: 0 %
Lymphocytes Absolute: 3.4 10*3/uL — ABNORMAL HIGH (ref 0.7–3.1)
Lymphs: 26 %
MCH: 29 pg (ref 26.6–33.0)
MCHC: 33.3 g/dL (ref 31.5–35.7)
MCV: 87 fL (ref 79–97)
Monocytes Absolute: 0.8 10*3/uL (ref 0.1–0.9)
Monocytes: 6 %
Neutrophils Absolute: 8.4 10*3/uL — ABNORMAL HIGH (ref 1.4–7.0)
Neutrophils: 66 %
Platelets: 302 10*3/uL (ref 150–450)
RBC: 5.31 x10E6/uL — ABNORMAL HIGH (ref 3.77–5.28)
RDW: 13.9 % (ref 11.7–15.4)
WBC: 12.8 10*3/uL — ABNORMAL HIGH (ref 3.4–10.8)

## 2021-11-16 LAB — LIPID PANEL
Chol/HDL Ratio: 3.6 ratio (ref 0.0–4.4)
Cholesterol, Total: 115 mg/dL (ref 100–199)
HDL: 32 mg/dL — ABNORMAL LOW (ref >39)
LDL Chol Calc (NIH): 59 mg/dL (ref 0–99)
Triglycerides: 138 mg/dL (ref 0–149)
VLDL Cholesterol Cal: 24 mg/dL (ref 5–40)

## 2021-11-16 LAB — BAYER DCA HB A1C WAIVED: HB A1C (BAYER DCA - WAIVED): 7.8 % — ABNORMAL HIGH (ref 4.8–5.6)

## 2021-11-16 NOTE — Progress Notes (Signed)
Hello Melissa Rubio, ? ?Your lab result is normal and/or stable.Some minor variations that are not significant are commonly marked abnormal, but do not represent any medical problem for you. ? ?Best regards, ?Claretta Fraise, M.D.

## 2021-11-17 ENCOUNTER — Ambulatory Visit (INDEPENDENT_AMBULATORY_CARE_PROVIDER_SITE_OTHER): Payer: Self-pay | Admitting: Cardiology

## 2021-11-17 ENCOUNTER — Encounter: Payer: Self-pay | Admitting: Cardiology

## 2021-11-17 VITALS — BP 134/78 | HR 92 | Ht 60.0 in | Wt 161.0 lb

## 2021-11-17 DIAGNOSIS — E782 Mixed hyperlipidemia: Secondary | ICD-10-CM

## 2021-11-17 DIAGNOSIS — R0789 Other chest pain: Secondary | ICD-10-CM

## 2021-11-17 DIAGNOSIS — I2510 Atherosclerotic heart disease of native coronary artery without angina pectoris: Secondary | ICD-10-CM

## 2021-11-17 NOTE — Patient Instructions (Signed)

## 2021-11-17 NOTE — Progress Notes (Signed)
? ? ? ?Clinical Summary ?[name] is a 51 y.o.female seen today for follow up of the following medical problems.  ?  ?1. CAD ?- inferior STEMI 08/2017. Full report as indicated below, received DES to RCA. LM 50%, LAD 65%, LCX 80% treated medically.  ?- 08/2017 echo LVEF 55-60%, inferior hypokinesis ?- medical therapy limited due to soft bp's during admission.  ?  ?- referred to CT surgery given remaining LM/LAD and LCX disease ?- s/p CABG 12/12/17 with LIMA-LAD, SVG OM ?- changed from brillinta to plavix due to cost ?- 11/2017 echo LVEF 55-60%, no WMAs ?  ?  ?  ?  ?- 11/2019 nuclear stress: prior inferior infarction with mild peri-infarct ischemia. Low risk study ?- prior symptoms resolved with imdur ?  ? ? ?- no chest pains. No SOB/DOE ?- compliant with meds ?  ?  ?  ?2. Hyperlipidemia ?- 03/2018 TC 225 TG 236 HDL 33 LDL 145 ?01/2020 TC 122 TG 137 HDL 26 LDL 69 ?  ?05/2021 TC 297 TG 228 HDL 26 LDL 225 (had been off crestor and zetia for period) ?  ?11/2021 TC 115 TG 138 HDL 32 LDL 59 ?  ?  ?4. PAD ?- prior to CABG ABI right 0.62, left 1.12 ?- mild leg cramping at times with walking R>L, not limiting ?  ?  ?5. COPD ?- has prn albuterol ?Past Medical History:  ?Diagnosis Date  ? Anxiety   ? Cancer [facility]) 1994  ? cervical  ? Coronary artery disease   ? 2/19 PCI/DES x1 to dRCA with LM, LAD disease (planned for possible CABG in near future), normal EF  ? Coronary artery disease involving native coronary artery of native heart with angina pectoris ([place])   ? Diabetes ([place])   ? Hypertension   ? Itching   ? Myocardial infarction [facility])   ? RLS (restless legs syndrome)   ? S/P CABG x 2 12/12/2017  ? LIMA to LAD SVG to OM  ? Tobacco use   ? ? ? ?No Known Allergies ? ? ?Current Outpatient Medications  ?Medication Sig Dispense Refill  ? aspirin 81 MG chewable tablet Chew 1 tablet (81 mg total) by mouth daily.    ? busPIRone (BUSPAR) 30 MG tablet Take 1 tablet (30 mg total) by mouth 3 (three) times daily. 90 tablet 2  ? carvedilol (COREG)  3.125 MG tablet Take 1 tablet (3.125 mg total) by mouth 2 (two) times daily with a meal. 180 tablet 3  ? citalopram (CELEXA) 40 MG tablet Take 1 tablet (40 mg total) by mouth daily. 90 tablet 2  ? cyclobenzaprine (FLEXERIL) 10 MG tablet Take 1 tablet (10 mg total) by mouth 3 (three) times daily as needed for muscle spasms. 60 tablet 3  ? ezetimibe (ZETIA) 10 MG tablet Take 1 tablet (10 mg total) by mouth daily. 90 tablet 3  ? gabapentin (NEURONTIN) 800 MG tablet Take 1 tablet (800 mg total) by mouth 4 (four) times daily. Plus 1/2 tablet extraat bedtime 405 tablet 1  ? hydrOXYzine (ATARAX/VISTARIL) 50 MG tablet Take 1 tablet (50 mg total) by mouth every 8 (eight) hours as needed. 90 tablet 5  ? ibuprofen (ADVIL) 800 MG tablet Take 1 tablet (800 mg total) by mouth every 8 (eight) hours as needed. 90 tablet 1  ? insulin aspart protamine- aspart (NOVOLOG MIX 70/30 RELION) (70-30) 100 UNIT/ML injection Use 70 units just before breakfast and 50 units just before supper 10 mL 11  ? Insulin  Syringe-Needle U-100 (INSULIN SYRINGE .5CC/31GX5/16") 31G X 5/16" 0.5 ML MISC Use with insulin twice a day Dx E11.9 200 each 3  ? isosorbide mononitrate (IMDUR) 30 MG 24 hr tablet Take 30 mg by mouth daily.    ? lisinopril (ZESTRIL) 2.5 MG tablet Take 1 tablet (2.5 mg total) by mouth daily. 90 tablet 3  ? metFORMIN (GLUCOPHAGE) 1000 MG tablet Take 1 tablet (1,000 mg total) by mouth 2 (two) times daily with a meal. 180 tablet 2  ? nitroGLYCERIN (NITROSTAT) 0.4 MG SL tablet DISSOLVE ONE TABLET UNDER THE TONGUE EVERY 5 MINUTES AS NEEDED FOR CHEST PAIN.  DO NOT EXCEED A TOTAL OF 3 DOSES IN 15 MINUTES 25 tablet 11  ? omeprazole (PRILOSEC) 20 MG capsule Take 20 mg by mouth daily.    ? rosuvastatin (CRESTOR) 40 MG tablet Take 1 tablet (40 mg total) by mouth daily. 90 tablet 3  ? traZODone (DESYREL) 150 MG tablet Use from 1/3 to 1 tablet nightly as needed for sleep. 30 tablet 5  ? ?No current facility-administered medications for this visit.   ? ? ? ?Past Surgical History:  ?Procedure Laterality Date  ? CARDIAC CATHETERIZATION    ? CORONARY ARTERY BYPASS GRAFT N/A 12/12/2017  ? Procedure: MEDIAN STERNOTOMY for CORONARY ARTERY BYPASS GRAFTING (CABG) x 2 (LIMA to LAD, SVG to OM) using RIGHT THIGH GREATER SAPHENOUS VEIN and LEFT INTERNAL MAMMARY ARTERY;  Surgeon: Rexene Alberts, MD;  Location: [place];  Service: Open Heart Surgery;  Laterality: N/A;  ? CORONARY STENT INTERVENTION N/A 09/01/2017  ? Procedure: CORONARY STENT INTERVENTION;  Surgeon: Martinique, Peter M, MD;  Location: [place] [facility] [facility];  Service: Cardiovascular;  Laterality: N/A;  ? CORONARY/GRAFT ACUTE MI REVASCULARIZATION N/A 09/01/2017  ? Procedure: Coronary/Graft Acute MI Revascularization;  Surgeon: Martinique, Peter M, MD;  Location: [place] [facility];  Service: Cardiovascular;  Laterality: N/A;  ? LEFT HEART CATH AND CORONARY ANGIOGRAPHY N/A 09/01/2017  ? Procedure: LEFT HEART CATH AND CORONARY ANGIOGRAPHY;  Surgeon: Martinique, Peter M, MD;  Location: [place] [facility];  Service: Cardiovascular;  Laterality: N/A;  ? PARTIAL HYSTERECTOMY  2009  ? TEE WITHOUT CARDIOVERSION N/A 12/12/2017  ? Procedure: TRANSESOPHAGEAL ECHOCARDIOGRAM (TEE);  Surgeon: Rexene Alberts, MD;  Location: [place];  Service: Open Heart Surgery;  Laterality: N/A;  ? ? ? ?No Known Allergies ? ? ? ?Family History  ?Problem Relation Age of Onset  ? Cancer Mother   ? Heart attack Father   ? Diabetes Sister   ? Diabetes Brother   ? Diabetes Sister   ? Heart attack Sister   ? ? ? ?Social History ?[name] reports that she has been smoking cigarettes. She started smoking about 40 years ago. She has a 26.25 pack-year smoking history. She has never used smokeless tobacco. ?[name] reports no history of alcohol use. ? ? ?Review of Systems ?CONSTITUTIONAL: No weight loss, fever, chills, weakness or fatigue.  ?HEENT: Eyes: No visual loss, blurred vision, double vision or yellow sclerae.No hearing loss, sneezing, congestion, runny nose or sore  throat.  ?SKIN: No rash or itching.  ?CARDIOVASCULAR: per hpi ?RESPIRATORY: No shortness of breath, cough or sputum.  ?GASTROINTESTINAL: No anorexia, nausea, vomiting or diarrhea. No abdominal pain or blood.  ?GENITOURINARY: No burning on urination, no polyuria ?NEUROLOGICAL: No headache, dizziness, syncope, paralysis, ataxia, numbness or tingling in the extremities. No change in bowel or bladder control.  ?MUSCULOSKELETAL: No muscle, back pain, joint pain or stiffness.  ?LYMPHATICS: No enlarged  nodes. No history of splenectomy.  ?PSYCHIATRIC: No history of depression or anxiety.  ?ENDOCRINOLOGIC: No reports of sweating, cold or heat intolerance. No polyuria or polydipsia.  ?. ? ? ?Physical Examination ?Today's Vitals  ? 11/17/21 1138  ?BP: 134/78  ?Pulse: 92  ?SpO2: 95%  ?Weight: 161 lb (73 kg)  ?Height: 5' (1.524 m)  ? ?Body mass index is 31.44 kg/m?. ? ?Gen: resting comfortably, no acute distress ?HEENT: no scleral icterus, pupils equal round and reactive, no palptable cervical adenopathy,  ?CV: RRR, no m/r/g no jvd ?Resp: Clear to auscultation bilaterally ?GI: abdomen is soft, non-tender, non-distended, normal bowel sounds, no hepatosplenomegaly ?MSK: extremities are warm, no edema.  ?Skin: warm, no rash ?Neuro:  no focal deficits ?Psych: appropriate affect ? ? ?Diagnostic Studies ? ?11/2019 nuclear stress ?There was no ST segment deviation noted during stress. ?The left ventricular ejection fraction is hyperdynamic (>65%). ?Findings consistent with prior myocardial infarction with mild peri-infarct ischemia. ?This is a low risk study. Elevated TID of unclear significance, may suggest somewhat higher risk ?  ? ? ?Assessment and Plan  ? ?1. CAD ?- prior inferior STEMI, s/p stenting. She had residual LM 50% and ostial LAD 65%, LCX 80%. She  had a CABG for this remaining disease ? -11/2019 nuclear stress with inferior infarct mild ischemia, low risk ?-prior chest pains resolved with imdur ? ?- no recent symptoms,  continue current meds ? ?  ?2.Hyperlipidemia ?-with improved compliance LDL back to goal, continue current meds ? ?3. PAD\ ?- mild nonlimiting symptoms, continue medical therapy ? ?F/u 6 months ?  ?  ?  ?

## 2021-11-22 ENCOUNTER — Telehealth: Payer: Self-pay | Admitting: Cardiology

## 2021-11-22 ENCOUNTER — Other Ambulatory Visit: Payer: Self-pay | Admitting: *Deleted

## 2021-11-22 ENCOUNTER — Telehealth: Payer: Self-pay | Admitting: Family Medicine

## 2021-11-22 DIAGNOSIS — M545 Low back pain, unspecified: Secondary | ICD-10-CM

## 2021-11-22 DIAGNOSIS — F419 Anxiety disorder, unspecified: Secondary | ICD-10-CM

## 2021-11-22 DIAGNOSIS — L299 Pruritus, unspecified: Secondary | ICD-10-CM

## 2021-11-22 DIAGNOSIS — G894 Chronic pain syndrome: Secondary | ICD-10-CM

## 2021-11-22 DIAGNOSIS — E1165 Type 2 diabetes mellitus with hyperglycemia: Secondary | ICD-10-CM

## 2021-11-22 DIAGNOSIS — E1065 Type 1 diabetes mellitus with hyperglycemia: Secondary | ICD-10-CM

## 2021-11-22 DIAGNOSIS — I10 Essential (primary) hypertension: Secondary | ICD-10-CM

## 2021-11-22 DIAGNOSIS — I25119 Atherosclerotic heart disease of native coronary artery with unspecified angina pectoris: Secondary | ICD-10-CM

## 2021-11-22 MED ORDER — CARVEDILOL 3.125 MG PO TABS: 3.1250 mg | ORAL_TABLET | Freq: Two times a day (BID) | ORAL | 3 refills | Status: DC

## 2021-11-22 MED ORDER — CITALOPRAM HYDROBROMIDE 40 MG PO TABS: 40.0000 mg | ORAL_TABLET | Freq: Every day | ORAL | 3 refills | Status: AC

## 2021-11-22 MED ORDER — NITROGLYCERIN 0.4 MG SL SUBL: SUBLINGUAL_TABLET | SUBLINGUAL | 11 refills | Status: AC

## 2021-11-22 MED ORDER — ROSUVASTATIN CALCIUM 40 MG PO TABS: 40.0000 mg | ORAL_TABLET | Freq: Every day | ORAL | 3 refills | Status: AC

## 2021-11-22 MED ORDER — EZETIMIBE 10 MG PO TABS: 10.0000 mg | ORAL_TABLET | Freq: Every day | ORAL | 3 refills | Status: AC

## 2021-11-22 MED ORDER — INSULIN ASPART PROT & ASPART (70-30 MIX) 100 UNIT/ML ~~LOC~~ SUSP: SUBCUTANEOUS | 11 refills | Status: AC

## 2021-11-22 MED ORDER — GABAPENTIN 800 MG PO TABS: 800.0000 mg | ORAL_TABLET | Freq: Four times a day (QID) | ORAL | 3 refills | Status: AC

## 2021-11-22 MED ORDER — HYDROXYZINE HCL 50 MG PO TABS: 50.0000 mg | ORAL_TABLET | Freq: Three times a day (TID) | ORAL | 5 refills | Status: AC | PRN

## 2021-11-22 MED ORDER — BUSPIRONE HCL 30 MG PO TABS: 30.0000 mg | ORAL_TABLET | Freq: Three times a day (TID) | ORAL | 3 refills | Status: AC

## 2021-11-22 MED ORDER — METFORMIN HCL 1000 MG PO TABS: 1000.0000 mg | ORAL_TABLET | Freq: Two times a day (BID) | ORAL | 3 refills | Status: AC

## 2021-11-22 MED ORDER — LISINOPRIL 2.5 MG PO TABS: 2.5000 mg | ORAL_TABLET | Freq: Every day | ORAL | 3 refills | Status: AC

## 2021-11-22 NOTE — Telephone Encounter (Signed)
Refills sent as requested

## 2021-11-22 NOTE — Telephone Encounter (Signed)
Pt says that all her refills have not been called in. Pt was seen last week. ?busPIRone (BUSPAR) 30 MG tablet ?carvedilol (COREG) 3.125 MG tablet ?citalopram (CELEXA) 40 MG tablet ?ezetimibe (ZETIA) 10 MG tablet ?gabapentin (NEURONTIN) 800 MG tablet ?hydrOXYzine (ATARAX/VISTARIL) 50 MG tablet ?isosorbide mononitrate (IMDUR) 30 MG 24 hr tablet ?lisinopril (ZESTRIL) 2.5 MG tablet ?metFORMIN (GLUCOPHAGE) 1000 MG tablet ?nitroGLYCERIN (NITROSTAT) 0.4 MG SL tablet ?Use [facility] [phone] ?

## 2021-11-22 NOTE — Telephone Encounter (Signed)
Also crestor ?

## 2021-11-22 NOTE — Telephone Encounter (Signed)
Pt c/o of Chest Pain: STAT if CP now or developed within 24 hours ? ?1. Are you having CP right now?  ?No  ? ?2. Are you experiencing any other symptoms (ex. SOB, nausea, vomiting, sweating)?  ?No  ? ?3. How long have you been experiencing CP?  ?Since Sunday, 5/14 ? ?4. Is your CP continuous or coming and going?  ?Coming and going ? ?5. Have you taken Nitroglycerin?  ?Patient states she was given a nitro patch at the hospital, but EKG/blood work is fine  ?? ? ?

## 2021-11-23 NOTE — Telephone Encounter (Signed)
Can do a virtual visit noon May 24th.   J Heidi Lemay MD

## 2021-11-23 NOTE — Telephone Encounter (Signed)
Patient informed and verbalized understanding of plan.    Patient Consent for Virtual Visit        Melissa Rubio has provided verbal consent on 11/23/2021 for a virtual visit (video or telephone).   CONSENT FOR VIRTUAL VISIT FOR:  Melissa Rubio  By participating in this virtual visit I agree to the following:  I hereby voluntarily request, consent and authorize [facility] and its employed or contracted physicians, physician assistants, nurse practitioners or other licensed health care professionals (the Practitioner), to provide me with telemedicine health care services (the "Services") as deemed necessary by the treating Practitioner. I acknowledge and consent to receive the Services by the Practitioner via telemedicine. I understand that the telemedicine visit will involve communicating with the Practitioner through live audiovisual communication technology and the disclosure of certain medical information by electronic transmission. I acknowledge that I have been given the opportunity to request an in-person assessment or other available alternative prior to the telemedicine visit and am voluntarily participating in the telemedicine visit.  I understand that I have the right to withhold or withdraw my consent to the use of telemedicine in the course of my care at any time, without affecting my right to future care or treatment, and that the Practitioner or I may terminate the telemedicine visit at any time. I understand that I have the right to inspect all information obtained and/or recorded in the course of the telemedicine visit and may receive copies of available information for a reasonable fee.  I understand that some of the potential risks of receiving the Services via telemedicine include:  Delay or interruption in medical evaluation due to technological equipment failure or disruption; Information transmitted may not be sufficient (e.g. poor resolution of images) to allow for appropriate  medical decision making by the Practitioner; and/or  In rare instances, security protocols could fail, causing a breach of personal health information.  Furthermore, I acknowledge that it is my responsibility to provide information about my medical history, conditions and care that is complete and accurate to the best of my ability. I acknowledge that Practitioner's advice, recommendations, and/or decision may be based on factors not within their control, such as incomplete or inaccurate data provided by me or distortions of diagnostic images or specimens that may result from electronic transmissions. I understand that the practice of medicine is not an exact science and that Practitioner makes no warranties or guarantees regarding treatment outcomes. I acknowledge that a copy of this consent can be made available to me via my patient portal ([facility]), or I can request a printed copy by calling the office of [facility].    I understand that my insurance will be billed for this visit.   I have read or had this consent read to me. I understand the contents of this consent, which adequately explains the benefits and risks of the Services being provided via telemedicine.  I have been provided ample opportunity to ask questions regarding this consent and the Services and have had my questions answered to my satisfaction. I give my informed consent for the services to be provided through the use of telemedicine in my medical care

## 2021-11-24 ENCOUNTER — Other Ambulatory Visit: Payer: Self-pay | Admitting: Cardiology

## 2021-11-24 ENCOUNTER — Telehealth: Payer: Self-pay | Admitting: Family Medicine

## 2021-11-29 ENCOUNTER — Encounter: Payer: Self-pay | Admitting: *Deleted

## 2021-11-29 ENCOUNTER — Ambulatory Visit (INDEPENDENT_AMBULATORY_CARE_PROVIDER_SITE_OTHER): Payer: Self-pay | Admitting: Cardiology

## 2021-11-29 ENCOUNTER — Encounter: Payer: Self-pay | Admitting: Cardiology

## 2021-11-29 VITALS — Ht 60.0 in | Wt 160.0 lb

## 2021-11-29 DIAGNOSIS — I25119 Atherosclerotic heart disease of native coronary artery with unspecified angina pectoris: Secondary | ICD-10-CM

## 2021-11-29 MED ORDER — ISOSORBIDE MONONITRATE ER 30 MG PO TB24: 30.0000 mg | ORAL_TABLET | Freq: Every day | ORAL | 2 refills | Status: AC

## 2021-11-29 NOTE — Addendum Note (Signed)
Addended by: Merlene Laughter on: 11/29/2021 01:30 PM   Modules accepted: Orders

## 2021-11-29 NOTE — Patient Instructions (Addendum)
Medication Instructions:  Your physician has recommended you make the following change in your medication:  Increase isosorbide mononitrate to 30 mg daily Continue other medications the same  Labwork: none  Testing/Procedures: Your physician has requested that you have a cardiac catheterization. Cardiac catheterization is used to diagnose and/or treat various heart conditions. Doctors may recommend this procedure for a number of different reasons. The most common reason is to evaluate chest pain. Chest pain can be a symptom of coronary artery disease (CAD), and cardiac catheterization can show whether plaque is narrowing or blocking your heart's arteries. This procedure is also used to evaluate the valves, as well as measure the blood flow and oxygen levels in different parts of your heart. For further information please visit [email or web address]. Please follow instruction sheet, as given.  Follow-Up: Your physician recommends that you schedule a follow-up appointment in: 3 months  Any Other Special Instructions Will Be Listed Below (If Applicable).  If you need a refill on your cardiac medications before your next appointment, please call your pharmacy.   [place] [address] Dept: [phone] Loc: [place]  11/29/2021  You are scheduled for a Cardiac Catheterization on Thursday, June 8 with Dr. Shelva Majestic.  1. Please arrive at the Main Entrance A at [facility]: [place], [place] [address] at 5:30 AM (This time is two hours before your procedure to ensure your preparation). Free valet parking service is available.   Special note: Every effort is made to have your procedure done on time. Please understand that emergencies sometimes delay scheduled procedures.  2. Diet: Do not eat solid foods after midnight.  You may  have clear liquids until 5 AM upon the day of the procedure.  3. Labs: You will not need to have blood drawn. (Last labs available in Care Everywhere from 11/19/2021).  4. Medication instructions in preparation for your procedure: Hold Novolog 70/30 & Metformin morning of cath. You may take the rest of your medications that morning.   Contrast Allergy: No  Do not take Diabetes Med Glucophage (Metformin) on the day of the procedure and HOLD 48 HOURS AFTER THE PROCEDURE.  On the morning of your procedure, take Aspirin 81 mg and any morning medicines NOT listed above.  You may use sips of water.  5. Plan to go home the same day, you will only stay overnight if medically necessary. 6. You MUST have a responsible adult to drive you home. 7. An adult MUST be with you the first 24 hours after you arrive home. 8. Bring a current list of your medications, and the last time and date medication taken. 9. Bring ID and current insurance cards. 10.Please wear clothes that are easy to get on and off and wear slip-on shoes.  Thank you for allowing [place] to care for you!   -- [facility]

## 2021-11-29 NOTE — Progress Notes (Signed)
Virtual Visit via Telephone Note   Because of Arena Lindahl co-morbid illnesses, she is at least at moderate risk for complications without adequate follow up.  This format is felt to be most appropriate for this patient at this time.  The patient did not have access to video technology/had technical difficulties with video requiring transitioning to audio format only (telephone).  All issues noted in this document were discussed and addressed.  No physical exam could be performed with this format.  Please refer to the patient's chart for her consent to telehealth for [facility].    Date:  11/29/2021   ID:  Melissa Rubio, DOB 1970-11-02, MRN [id number] The patient was identified using 2 identifiers.  Patient Location: Home Provider Location: Office/Clinic   PCP:  Claretta Fraise, MD   [facility] Providers Cardiologist:  Carlyle Dolly, MD     Evaluation Performed:  Follow-Up Visit  Chief Complaint:  Chest pain  History of Present Illness:    Melissa Rubio is a 51 y.o. female wiseen today for follow up of the following medical problems. This is a focused visit on recent symptoms of chest pain.     1. 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 Full report as indicated below, received DES to RCA. LM 50%, LAD 65%, LCX 80% treated medically.  - 08/2017 echo LVEF 55-60%, inferior hypokinesis - medical therapy limited due to soft bp's during admission.    - referred to CT surgery given remaining LM/LAD and LCX disease - s/p CABG 12/12/17 with LIMA-LAD, SVG OM - changed from brillinta to plavix due to cost - 11/2017 echo LVEF 55-60%, no WMAs         - 11/2019 nuclear stress: prior inferior infarction with mild peri-infarct ischemia. Low risk study - prior symptoms resolved with imdur       - chest pain on Mother' day. Occurred at rest. Squeezing/tight, midchest. 10/10 in severity. No othera ssociated symptoms. Worst with deep breathing and movement. Pain was constant over that day x 5  hours. Better with NG patch.  - similar to prior heart - some recurrent episodes the following day, took NG and improved. Some episodes last night, resolved with NG      Other medical problems not addressed today   2. Hyperlipidemia - 03/2018 TC 225 TG 236 HDL 33 LDL 145 01/2020 TC 122 TG 137 HDL 26 LDL 69   05/2021 TC 297 TG 228 HDL 26 LDL 225 (had been off crestor and zetia for period)   11/2021 TC 115 TG 138 HDL 32 LDL 59     4. PAD - prior to CABG ABI right 0.62, left 1.12 - mild leg cramping at times with walking R>L, not limiting     5. 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   LIMA to LAD SVG to OM   Tobacco use    Past Surgical History:  Procedure Laterality Date   CARDIAC CATHETERIZATION  CORONARY ARTERY BYPASS GRAFT N/A 12/12/2017   Procedure: MEDIAN STERNOTOMY for CORONARY ARTERY BYPASS GRAFTING (CABG) x 2 (LIMA to LAD, SVG to OM) using RIGHT THIGH GREATER SAPHENOUS VEIN and LEFT INTERNAL MAMMARY ARTERY;  Surgeon: Rexene Alberts, MD;  Location: [facility];  Service: Open Heart Surgery;  Laterality: N/A;   CORONARY STENT INTERVENTION N/A 09/01/2017   Procedure: CORONARY STENT INTERVENTION;  Surgeon: Martinique, Peter M, MD;  Location: [facility];  Service: Cardiovascular;  Laterality: N/A;   CORONARY/GRAFT ACUTE MI REVASCULARIZATION N/A 09/01/2017   Procedure: Coronary/Graft Acute MI Revascularization;  Surgeon: Martinique, Peter M, MD;  Location: [facility];  Service: Cardiovascular;  Laterality: N/A;   LEFT HEART CATH AND CORONARY  ANGIOGRAPHY N/A 09/01/2017   Procedure: LEFT HEART CATH AND CORONARY ANGIOGRAPHY;  Surgeon: Martinique, Peter M, MD;  Location: [facility];  Service: Cardiovascular;  Laterality: N/A;   PARTIAL HYSTERECTOMY  2009   TEE WITHOUT CARDIOVERSION N/A 12/12/2017   Procedure: TRANSESOPHAGEAL ECHOCARDIOGRAM (TEE);  Surgeon: Rexene Alberts, MD;  Location: [facility];  Service: Open Heart Surgery;  Laterality: N/A;     Current Meds  Medication Sig   aspirin 81 MG chewable tablet Chew 1 tablet (81 mg total) by mouth daily.   busPIRone (BUSPAR) 30 MG tablet Take 1 tablet (30 mg total) by mouth 3 (three) times daily.   carvedilol (COREG) 3.125 MG tablet Take 1 tablet (3.125 mg total) by mouth 2 (two) times daily with a meal.   citalopram (CELEXA) 40 MG tablet Take 1 tablet (40 mg total) by mouth daily.   cyclobenzaprine (FLEXERIL) 10 MG tablet Take 1 tablet (10 mg total) by mouth 3 (three) times daily as needed for muscle spasms.   ezetimibe (ZETIA) 10 MG tablet Take 1 tablet (10 mg total) by mouth daily.   gabapentin (NEURONTIN) 800 MG tablet Take 1 tablet (800 mg total) by mouth 4 (four) times daily. Plus 1/2 tablet extraat bedtime   hydrOXYzine (ATARAX) 50 MG tablet Take 1 tablet (50 mg total) by mouth every 8 (eight) hours as needed.   ibuprofen (ADVIL) 800 MG tablet Take 1 tablet (800 mg total) by mouth every 8 (eight) hours as needed.   insulin aspart protamine- aspart (NOVOLOG MIX 70/30 RELION) (70-30) 100 UNIT/ML injection Use 70 units just before breakfast and 50 units just before supper   isosorbide mononitrate (IMDUR) 30 MG 24 hr tablet Take 1/2 (one-half) tablet by mouth once daily   lisinopril (ZESTRIL) 2.5 MG tablet Take 1 tablet (2.5 mg total) by mouth daily.   metFORMIN (GLUCOPHAGE) 1000 MG tablet Take 1 tablet (1,000 mg total) by mouth 2 (two) times daily with a meal.   nitroGLYCERIN (NITROSTAT) 0.4 MG SL tablet DISSOLVE ONE TABLET UNDER THE TONGUE EVERY 5 MINUTES AS NEEDED FOR CHEST PAIN.  DO  NOT EXCEED A TOTAL OF 3 DOSES IN 15 MINUTES   omeprazole (PRILOSEC) 20 MG capsule Take 20 mg by mouth daily.   rosuvastatin (CRESTOR) 40 MG tablet Take 1 tablet (40 mg total) by mouth daily.   traZODone (DESYREL) 150 MG tablet Use from 1/3 to 1 tablet nightly as needed for sleep.     Allergies:   Patient has no known allergies.   Social History   Tobacco Use   Smoking status: Some Days    Packs/day: 0.75    Years: 35.00    Pack years: 26.25    Types: Cigarettes    Start date: 09/01/1981  Smokeless tobacco: Never   Tobacco comments:    patient has admitted to continued smoking, but is trying to quit  Vaping Use   Vaping Use: Never used  Substance Use Topics   Alcohol use: No   Drug use: Never     Family Hx: The patient's family history includes Cancer in her mother; Diabetes in her brother, sister, and sister; Heart attack in her father and sister.  ROS:   Please see the history of present illness.     All other systems reviewed and are negative.   Prior CV studies:   The following studies were reviewed today:  11/2019 nuclear stress There was no ST segment deviation noted during stress. The left ventricular ejection fraction is hyperdynamic (>65%). Findings consistent with prior myocardial infarction with mild peri-infarct ischemia. This is a low risk study. Elevated TID of unclear significance, may suggest somewhat higher risk  Labs/Other Tests and Data Reviewed:    EKG:  No ECG reviewed.  Recent Labs: 11/15/2021: ALT 15; BUN 7; Creatinine, Ser 0.80; Hemoglobin 15.4; Platelets 302; Potassium 5.1; Sodium 141   Recent Lipid Panel Lab Results  Component Value Date/Time   CHOL 115 11/15/2021 04:27 PM   TRIG 138 11/15/2021 04:27 PM   HDL 32 (L) 11/15/2021 04:27 PM   CHOLHDL 3.6 11/15/2021 04:27 PM   CHOLHDL 4.7 01/12/2020 11:29 AM   LDLCALC 59 11/15/2021 04:27 PM    Wt Readings from Last 3 Encounters:  11/29/21 160 lb (72.6 kg)  11/17/21 161 lb (73 kg)   11/15/21 160 lb 12.8 oz (72.9 kg)     Risk Assessment/Calculations:          Objective:    Vital Signs:  Ht 5' (1.524 m)   Wt 160 lb (72.6 kg)   LMP 04/17/2014   BMI 31.25 kg/m    Normal affect. Normal speech pattern and tone. Comfortabel, no apparent distress. No audible signs of sob or wheezing.   ASSESSMENT & PLAN:    1. CAD with unspecified angina - prior inferior STEMI, s/p stenting. She had residual LM 50% and ostial LAD 65%, LCX 80%. She  had a CABG for this remaining disease  -11/2019 nuclear stress with inferior infarct mild ischemia, low risk -progressing chest pains concerning for angina. We will increase imdur to '30mg'$  and plan for left heart catheterization. She is currently in [place], [place] visiting family and not back until June 6. Plan for cath here June 8, we did discuss that if severe progressing symptoms needs to present back to ER there.       Shared Decision Making/Informed Consent{  The risks [stroke (1 in 1000), death (1 in 1000), kidney failure [usually temporary] (1 in 500), bleeding (1 in 200), allergic reaction [possibly serious] (1 in 200)], benefits (diagnostic support and management of coronary artery disease) and alternatives of a cardiac catheterization were discussed in detail with Ms. Lohr and she is willing to proceed.     Time:   Today, I have spent 25 minutes with the patient with telehealth technology discussing the above problems.     Medication Adjustments/Labs and Tests Ordered: Current medicines are reviewed at length with the patient today.  Concerns regarding medicines are outlined above.   Tests Ordered: No orders of the defined types were placed in this encounter.   Medication Changes: No orders of the defined types were placed in this encounter.   Follow Up:  In Person 1 month  Signed, Carlyle Dolly, MD  11/29/2021  12:20 PM    [facility]

## 2021-11-29 NOTE — H&P (View-Only) (Signed)
Virtual Visit via Telephone Note   Because of Melissa Rubio co-morbid illnesses, she is at least at moderate risk for complications without adequate follow up.  This format is felt to be most appropriate for this patient at this time.  The patient did not have access to video technology/had technical difficulties with video requiring transitioning to audio format only (telephone).  All issues noted in this document were discussed and addressed.  No physical exam could be performed with this format.  Please refer to the patient's chart for her consent to telehealth for [facility].    Date:  11/29/2021   ID:  Melissa Rubio, DOB [birth year], MRN [id number] The patient was identified using 2 identifiers.  Patient Location: Home Provider Location: Office/Clinic   PCP:  Claretta Fraise, MD   [facility] Providers Cardiologist:  Carlyle Dolly, MD     Evaluation Performed:  Follow-Up Visit  Chief Complaint:  Chest pain  History of Present Illness:    Melissa Rubio is a 51 y.o. female wiseen today for follow up of the following medical problems. This is a focused visit on recent symptoms of chest pain.     1. 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 Full report as indicated below, received DES to RCA. LM 50%, LAD 65%, LCX 80% treated medically.  - 08/2017 echo LVEF 55-60%, inferior hypokinesis - medical therapy limited due to soft bp's during admission.    - referred to CT surgery given remaining LM/LAD and LCX disease - s/p CABG 12/12/17 with LIMA-LAD, SVG OM - changed from brillinta to plavix due to cost - 11/2017 echo LVEF 55-60%, no WMAs         - 11/2019 nuclear stress: prior inferior infarction with mild peri-infarct ischemia. Low risk study - prior symptoms resolved with imdur       - chest pain on Mother' day. Occurred at rest. Squeezing/tight, midchest. 10/10 in severity. No othera ssociated symptoms. Worst with deep breathing and movement. Pain was constant over that day x 5  hours. Better with NG patch.  - similar to prior heart - some recurrent episodes the following day, took NG and improved. Some episodes last night, resolved with NG      Other medical problems not addressed today   2. Hyperlipidemia - 03/2018 TC 225 TG 236 HDL 33 LDL 145 01/2020 TC 122 TG 137 HDL 26 LDL 69   05/2021 TC 297 TG 228 HDL 26 LDL 225 (had been off crestor and zetia for period)   11/2021 TC 115 TG 138 HDL 32 LDL 59     4. PAD - prior to CABG ABI right 0.62, left 1.12 - mild leg cramping at times with walking R>L, not limiting     5. COPD - has prn albuterol  Past Medical History:  Diagnosis Date   Anxiety    Cancer [facility]) 1994   cervical   Coronary artery disease    2/19 PCI/DES x1 to dRCA with LM, LAD disease (planned for possible CABG in near future), normal EF   Coronary artery disease involving native coronary artery of native heart with angina pectoris (HCC)    Diabetes (HCC)    Hypertension    Itching    Myocardial infarction (HCC)    RLS (restless legs syndrome)    S/P CABG x 2 12/12/2017   LIMA to LAD SVG to OM   Tobacco use    Past Surgical History:  Procedure Laterality Date   CARDIAC CATHETERIZATION  CORONARY ARTERY BYPASS GRAFT N/A 12/12/2017   Procedure: MEDIAN STERNOTOMY for CORONARY ARTERY BYPASS GRAFTING (CABG) x 2 (LIMA to LAD, SVG to OM) using RIGHT THIGH GREATER SAPHENOUS VEIN and LEFT INTERNAL MAMMARY ARTERY;  Surgeon: Rexene Alberts, MD;  Location: [place];  Service: Open Heart Surgery;  Laterality: N/A;   CORONARY STENT INTERVENTION N/A 09/01/2017   Procedure: CORONARY STENT INTERVENTION;  Surgeon: Martinique, Peter M, MD;  Location: [place] [facility];  Service: Cardiovascular;  Laterality: N/A;   CORONARY/GRAFT ACUTE MI REVASCULARIZATION N/A 09/01/2017   Procedure: Coronary/Graft Acute MI Revascularization;  Surgeon: Martinique, Peter M, MD;  Location: [place] [facility];  Service: Cardiovascular;  Laterality: N/A;   LEFT HEART CATH AND CORONARY  ANGIOGRAPHY N/A 09/01/2017   Procedure: LEFT HEART CATH AND CORONARY ANGIOGRAPHY;  Surgeon: Martinique, Peter M, MD;  Location: [place] [facility];  Service: Cardiovascular;  Laterality: N/A;   PARTIAL HYSTERECTOMY  2009   TEE WITHOUT CARDIOVERSION N/A 12/12/2017   Procedure: TRANSESOPHAGEAL ECHOCARDIOGRAM (TEE);  Surgeon: Rexene Alberts, MD;  Location: [place];  Service: Open Heart Surgery;  Laterality: N/A;     Current Meds  Medication Sig   aspirin 81 MG chewable tablet Chew 1 tablet (81 mg total) by mouth daily.   busPIRone (BUSPAR) 30 MG tablet Take 1 tablet (30 mg total) by mouth 3 (three) times daily.   carvedilol (COREG) 3.125 MG tablet Take 1 tablet (3.125 mg total) by mouth 2 (two) times daily with a meal.   citalopram (CELEXA) 40 MG tablet Take 1 tablet (40 mg total) by mouth daily.   cyclobenzaprine (FLEXERIL) 10 MG tablet Take 1 tablet (10 mg total) by mouth 3 (three) times daily as needed for muscle spasms.   ezetimibe (ZETIA) 10 MG tablet Take 1 tablet (10 mg total) by mouth daily.   gabapentin (NEURONTIN) 800 MG tablet Take 1 tablet (800 mg total) by mouth 4 (four) times daily. Plus 1/2 tablet extraat bedtime   hydrOXYzine (ATARAX) 50 MG tablet Take 1 tablet (50 mg total) by mouth every 8 (eight) hours as needed.   ibuprofen (ADVIL) 800 MG tablet Take 1 tablet (800 mg total) by mouth every 8 (eight) hours as needed.   insulin aspart protamine- aspart (NOVOLOG MIX 70/30 RELION) (70-30) 100 UNIT/ML injection Use 70 units just before breakfast and 50 units just before supper   isosorbide mononitrate (IMDUR) 30 MG 24 hr tablet Take 1/2 (one-half) tablet by mouth once daily   lisinopril (ZESTRIL) 2.5 MG tablet Take 1 tablet (2.5 mg total) by mouth daily.   metFORMIN (GLUCOPHAGE) 1000 MG tablet Take 1 tablet (1,000 mg total) by mouth 2 (two) times daily with a meal.   nitroGLYCERIN (NITROSTAT) 0.4 MG SL tablet DISSOLVE ONE TABLET UNDER THE TONGUE EVERY 5 MINUTES AS NEEDED FOR CHEST PAIN.  DO  NOT EXCEED A TOTAL OF 3 DOSES IN 15 MINUTES   omeprazole (PRILOSEC) 20 MG capsule Take 20 mg by mouth daily.   rosuvastatin (CRESTOR) 40 MG tablet Take 1 tablet (40 mg total) by mouth daily.   traZODone (DESYREL) 150 MG tablet Use from 1/3 to 1 tablet nightly as needed for sleep.     Allergies:   Patient has no known allergies.   Social History   Tobacco Use   Smoking status: Some Days    Packs/day: 0.75    Years: 35.00    Pack years: 26.25    Types: Cigarettes    Start date: 09/01/1981  Smokeless tobacco: Never   Tobacco comments:    patient has admitted to continued smoking, but is trying to quit  Vaping Use   Vaping Use: Never used  Substance Use Topics   Alcohol use: No   Drug use: Never     Family Hx: The patient's family history includes Cancer in her mother; Diabetes in her brother, sister, and sister; Heart attack in her father and sister.  ROS:   Please see the history of present illness.     All other systems reviewed and are negative.   Prior CV studies:   The following studies were reviewed today:  11/2019 nuclear stress There was no ST segment deviation noted during stress. The left ventricular ejection fraction is hyperdynamic (>65%). Findings consistent with prior myocardial infarction with mild peri-infarct ischemia. This is a low risk study. Elevated TID of unclear significance, may suggest somewhat higher risk  Labs/Other Tests and Data Reviewed:    EKG:  No ECG reviewed.  Recent Labs: 11/15/2021: ALT 15; BUN 7; Creatinine, Ser 0.80; Hemoglobin 15.4; Platelets 302; Potassium 5.1; Sodium 141   Recent Lipid Panel Lab Results  Component Value Date/Time   CHOL 115 11/15/2021 04:27 PM   TRIG 138 11/15/2021 04:27 PM   HDL 32 (L) 11/15/2021 04:27 PM   CHOLHDL 3.6 11/15/2021 04:27 PM   CHOLHDL 4.7 01/12/2020 11:29 AM   LDLCALC 59 11/15/2021 04:27 PM    Wt Readings from Last 3 Encounters:  11/29/21 160 lb (72.6 kg)  11/17/21 161 lb (73 kg)   11/15/21 160 lb 12.8 oz (72.9 kg)     Risk Assessment/Calculations:          Objective:    Vital Signs:  Ht 5' (1.524 m)   Wt 160 lb (72.6 kg)   LMP 04/17/2014   BMI 31.25 kg/m    Normal affect. Normal speech pattern and tone. Comfortabel, no apparent distress. No audible signs of sob or wheezing.   ASSESSMENT & PLAN:    1. CAD with unspecified angina - prior inferior STEMI, s/p stenting. She had residual LM 50% and ostial LAD 65%, LCX 80%. She  had a CABG for this remaining disease  -11/2019 nuclear stress with inferior infarct mild ischemia, low risk -progressing chest pains concerning for angina. We will increase imdur to '30mg'$  and plan for left heart catheterization. She is currently in [place], [place] visiting family and not back until June 6. Plan for cath here June 8, we did discuss that if severe progressing symptoms needs to present back to ER there.       Shared Decision Making/Informed Consent{  The risks [stroke (1 in 1000), death (1 in 1000), kidney failure [usually temporary] (1 in 500), bleeding (1 in 200), allergic reaction [possibly serious] (1 in 200)], benefits (diagnostic support and management of coronary artery disease) and alternatives of a cardiac catheterization were discussed in detail with Ms. Delossantos and she is willing to proceed.     Time:   Today, I have spent 25 minutes with the patient with telehealth technology discussing the above problems.     Medication Adjustments/Labs and Tests Ordered: Current medicines are reviewed at length with the patient today.  Concerns regarding medicines are outlined above.   Tests Ordered: No orders of the defined types were placed in this encounter.   Medication Changes: No orders of the defined types were placed in this encounter.   Follow Up:  In Person 1 month  Signed, Carlyle Dolly, MD  11/29/2021  12:20 PM    [facility]

## 2021-11-30 ENCOUNTER — Telehealth: Payer: Self-pay | Admitting: Cardiology

## 2021-11-30 NOTE — Telephone Encounter (Signed)
PERCERT:  Left heart cath dx: chest pain Thursday, 12/14/2021 '@7'$ :30 am with Dr. Claiborne Billings

## 2021-12-05 ENCOUNTER — Other Ambulatory Visit: Payer: Self-pay | Admitting: Cardiology

## 2021-12-05 DIAGNOSIS — R079 Chest pain, unspecified: Secondary | ICD-10-CM

## 2021-12-05 MED ORDER — SODIUM CHLORIDE 0.9% FLUSH: 3.0000 mL | Freq: Two times a day (BID) | INTRAVENOUS | Status: AC

## 2021-12-12 ENCOUNTER — Telehealth: Payer: Self-pay | Admitting: *Deleted

## 2021-12-12 NOTE — Telephone Encounter (Addendum)
Cardiac Catheterization scheduled at [facility] for: Thursday December 14, 2021 7:30 AM Arrival time and place: [place] Entrance A at: 5:30 AM   Nothing to eat after midnight prior to procedure, clear liquids until 5 AM day of procedure.  Medication instructions: -Hold:  Insulin-AM of procedure/1/2 usual Insulin HS prior to procedure  Metformin-day of procedure and 48 hours post procedure. -Except hold medications usual morning medications can be taken with sips of water including aspirin 81 mg.  Confirmed patient has responsible adult to drive home post procedure and be with patient first 24 hours after arriving home.  Patient reports no new symptoms concerning for COVID-19/no exposure to COVID-19 in the past 10 days.  Reviewed procedure instructions with patient.

## 2021-12-14 ENCOUNTER — Ambulatory Visit (HOSPITAL_COMMUNITY)
Admission: RE | Disposition: A | Discharge status: Home / Self Care | Payer: Self-pay | Source: Home / Self Care | Attending: Cardiovascular Disease

## 2021-12-14 ENCOUNTER — Ambulatory Visit (HOSPITAL_COMMUNITY)
Admission: RE | Admit: 2021-12-14 | Discharge: 2021-12-14 | Disposition: A | Discharge status: Home / Self Care | Payer: Self-pay | Attending: Cardiovascular Disease | Admitting: Cardiovascular Disease

## 2021-12-14 ENCOUNTER — Other Ambulatory Visit: Payer: Self-pay

## 2021-12-14 DIAGNOSIS — Z87891 Personal history of nicotine dependence: Secondary | ICD-10-CM | POA: Insufficient documentation

## 2021-12-14 DIAGNOSIS — Z7984 Long term (current) use of oral hypoglycemic drugs: Secondary | ICD-10-CM | POA: Insufficient documentation

## 2021-12-14 DIAGNOSIS — E1151 Type 2 diabetes mellitus with diabetic peripheral angiopathy without gangrene: Secondary | ICD-10-CM | POA: Insufficient documentation

## 2021-12-14 DIAGNOSIS — Z794 Long term (current) use of insulin: Secondary | ICD-10-CM | POA: Insufficient documentation

## 2021-12-14 DIAGNOSIS — Z79899 Other long term (current) drug therapy: Secondary | ICD-10-CM | POA: Insufficient documentation

## 2021-12-14 DIAGNOSIS — Z7902 Long term (current) use of antithrombotics/antiplatelets: Secondary | ICD-10-CM | POA: Insufficient documentation

## 2021-12-14 DIAGNOSIS — I252 Old myocardial infarction: Secondary | ICD-10-CM | POA: Insufficient documentation

## 2021-12-14 DIAGNOSIS — I25810 Atherosclerosis of coronary artery bypass graft(s) without angina pectoris: Secondary | ICD-10-CM | POA: Insufficient documentation

## 2021-12-14 DIAGNOSIS — J449 Chronic obstructive pulmonary disease, unspecified: Secondary | ICD-10-CM | POA: Insufficient documentation

## 2021-12-14 DIAGNOSIS — I10 Essential (primary) hypertension: Secondary | ICD-10-CM | POA: Insufficient documentation

## 2021-12-14 DIAGNOSIS — Z955 Presence of coronary angioplasty implant and graft: Secondary | ICD-10-CM | POA: Insufficient documentation

## 2021-12-14 DIAGNOSIS — R079 Chest pain, unspecified: Secondary | ICD-10-CM

## 2021-12-14 DIAGNOSIS — E785 Hyperlipidemia, unspecified: Secondary | ICD-10-CM | POA: Insufficient documentation

## 2021-12-14 DIAGNOSIS — I25118 Atherosclerotic heart disease of native coronary artery with other forms of angina pectoris: Secondary | ICD-10-CM

## 2021-12-14 DIAGNOSIS — I6501 Occlusion and stenosis of right vertebral artery: Secondary | ICD-10-CM | POA: Insufficient documentation

## 2021-12-14 DIAGNOSIS — I2510 Atherosclerotic heart disease of native coronary artery without angina pectoris: Secondary | ICD-10-CM

## 2021-12-14 HISTORY — PX: LEFT HEART CATH AND CORS/GRAFTS ANGIOGRAPHY: CATH118250

## 2021-12-14 LAB — POCT ACTIVATED CLOTTING TIME: Activated Clotting Time: 185 seconds

## 2021-12-14 LAB — GLUCOSE, CAPILLARY
Glucose-Capillary: 171 mg/dL — ABNORMAL HIGH (ref 70–99)
Glucose-Capillary: 80 mg/dL (ref 70–99)

## 2021-12-14 SURGERY — LEFT HEART CATH AND CORS/GRAFTS ANGIOGRAPHY
Anesthesia: LOCAL

## 2021-12-14 MED ORDER — ASPIRIN 81 MG PO CHEW: 81.0000 mg | CHEWABLE_TABLET | ORAL | Status: DC

## 2021-12-14 MED ORDER — DIAZEPAM 5 MG PO TABS: 5.0000 mg | ORAL_TABLET | Freq: Four times a day (QID) | ORAL | Status: DC | PRN

## 2021-12-14 MED ORDER — SODIUM CHLORIDE 0.9 % WEIGHT BASED INFUSION: 1.0000 mL/kg/h | INTRAVENOUS | Status: DC

## 2021-12-14 MED ORDER — SODIUM CHLORIDE 0.9% FLUSH: 3.0000 mL | INTRAVENOUS | Status: DC | PRN

## 2021-12-14 MED ORDER — SODIUM CHLORIDE 0.9 % IV SOLN: 250.0000 mL | INTRAVENOUS | Status: DC | PRN

## 2021-12-14 MED ORDER — MIDAZOLAM HCL 2 MG/2ML IJ SOLN
INTRAMUSCULAR | Status: AC
  Filled 2021-12-14: qty 2

## 2021-12-14 MED ORDER — MIDAZOLAM HCL 2 MG/2ML IJ SOLN
INTRAMUSCULAR | Status: DC | PRN
  Administered 2021-12-14: 1 mg via INTRAVENOUS
  Administered 2021-12-14: 2 mg via INTRAVENOUS

## 2021-12-14 MED ORDER — SODIUM CHLORIDE 0.9 % IV SOLN: INTRAVENOUS | Status: DC

## 2021-12-14 MED ORDER — VERAPAMIL HCL 2.5 MG/ML IV SOLN
INTRAVENOUS | Status: DC | PRN
  Administered 2021-12-14: 10 mL via INTRA_ARTERIAL

## 2021-12-14 MED ORDER — SODIUM CHLORIDE 0.9 % IV SOLN
250.0000 mL | INTRAVENOUS | Status: DC | PRN
Start: 2021-12-14 — End: 2021-12-14

## 2021-12-14 MED ORDER — FENTANYL CITRATE (PF) 100 MCG/2ML IJ SOLN
INTRAMUSCULAR | Status: DC | PRN
  Administered 2021-12-14 (×2): 25 ug via INTRAVENOUS

## 2021-12-14 MED ORDER — HEPARIN (PORCINE) IN NACL 1000-0.9 UT/500ML-% IV SOLN
INTRAVENOUS | Status: AC
  Filled 2021-12-14: qty 1000

## 2021-12-14 MED ORDER — LABETALOL HCL 5 MG/ML IV SOLN: 10.0000 mg | INTRAVENOUS | Status: DC | PRN

## 2021-12-14 MED ORDER — HEPARIN SODIUM (PORCINE) 1000 UNIT/ML IJ SOLN
INTRAMUSCULAR | Status: DC | PRN
  Administered 2021-12-14: 3500 [IU] via INTRAVENOUS

## 2021-12-14 MED ORDER — HEPARIN SODIUM (PORCINE) 1000 UNIT/ML IJ SOLN
INTRAMUSCULAR | Status: AC
  Filled 2021-12-14: qty 10

## 2021-12-14 MED ORDER — CARVEDILOL 3.125 MG PO TABS: 6.2500 mg | ORAL_TABLET | Freq: Two times a day (BID) | ORAL | 3 refills | Status: AC

## 2021-12-14 MED ORDER — ONDANSETRON HCL 4 MG/2ML IJ SOLN: 4.0000 mg | Freq: Four times a day (QID) | INTRAMUSCULAR | Status: DC | PRN

## 2021-12-14 MED ORDER — VERAPAMIL HCL 2.5 MG/ML IV SOLN
INTRAVENOUS | Status: AC
  Filled 2021-12-14: qty 2

## 2021-12-14 MED ORDER — LIDOCAINE HCL (PF) 1 % IJ SOLN
INTRAMUSCULAR | Status: AC
  Filled 2021-12-14: qty 30

## 2021-12-14 MED ORDER — HEPARIN (PORCINE) IN NACL 1000-0.9 UT/500ML-% IV SOLN
INTRAVENOUS | Status: DC | PRN
  Administered 2021-12-14 (×2): 500 mL

## 2021-12-14 MED ORDER — ACETAMINOPHEN 325 MG PO TABS: 650.0000 mg | ORAL_TABLET | ORAL | Status: DC | PRN

## 2021-12-14 MED ORDER — HYDRALAZINE HCL 20 MG/ML IJ SOLN: 10.0000 mg | INTRAMUSCULAR | Status: DC | PRN

## 2021-12-14 MED ORDER — ASPIRIN 81 MG PO CHEW: 81.0000 mg | CHEWABLE_TABLET | Freq: Every day | ORAL | Status: DC

## 2021-12-14 MED ORDER — IOHEXOL 350 MG/ML SOLN
INTRAVENOUS | Status: DC | PRN
  Administered 2021-12-14: 100 mL

## 2021-12-14 MED ORDER — SODIUM CHLORIDE 0.9% FLUSH
3.0000 mL | Freq: Two times a day (BID) | INTRAVENOUS | Status: DC
Start: 2021-12-14 — End: 2021-12-14

## 2021-12-14 MED ORDER — FENTANYL CITRATE (PF) 100 MCG/2ML IJ SOLN
INTRAMUSCULAR | Status: AC
  Filled 2021-12-14: qty 2

## 2021-12-14 MED ORDER — SODIUM CHLORIDE 0.9 % WEIGHT BASED INFUSION
3.0000 mL/kg/h | INTRAVENOUS | Status: AC
  Administered 2021-12-14: 3 mL/kg/h via INTRAVENOUS

## 2021-12-14 SURGICAL SUPPLY — 16 items
BAND ZEPHYR COMPRESS 30 LONG (HEMOSTASIS) ×1 IMPLANT
CATH INFINITI 5 FR LCB (CATHETERS) ×1 IMPLANT
CATH INFINITI 5FR MULTPACK ANG (CATHETERS) ×1 IMPLANT
ELECT DEFIB PAD ADLT CADENCE (PAD) ×1 IMPLANT
GLIDESHEATH SLEND SS 6F .021 (SHEATH) ×1 IMPLANT
GUIDEWIRE ANGLED .035X150CM (WIRE) ×1 IMPLANT
GUIDEWIRE INQWIRE 1.5J.035X260 (WIRE) IMPLANT
INQWIRE 1.5J .035X260CM (WIRE) ×2
KIT HEART LEFT (KITS) ×2 IMPLANT
PACK CARDIAC CATHETERIZATION (CUSTOM PROCEDURE TRAY) ×2 IMPLANT
SHEATH PINNACLE 5F 10CM (SHEATH) ×1 IMPLANT
SYR MEDRAD MARK 7 150ML (SYRINGE) ×2 IMPLANT
TRANSDUCER W/STOPCOCK (MISCELLANEOUS) ×2 IMPLANT
TUBING CIL FLEX 10 FLL-RA (TUBING) ×2 IMPLANT
WIRE EMERALD 3MM-J .035X150CM (WIRE) ×1 IMPLANT
WIRE HI TORQ VERSACORE-J 145CM (WIRE) ×1 IMPLANT

## 2021-12-14 NOTE — Interval H&P Note (Signed)
Cath Lab Visit (complete for each Cath Lab visit)  Clinical Evaluation Leading to the Procedure:   ACS: No.  Non-ACS:    Anginal Classification: CCS III  Anti-ischemic medical therapy: Maximal Therapy (2 or more classes of medications)  Non-Invasive Test Results: No non-invasive testing performed  Prior CABG: Previous CABG      History and Physical Interval Note:  12/14/2021 7:46 AM  Melissa Rubio  has presented today for surgery, with the diagnosis of chest pain.  The various methods of treatment have been discussed with the patient and family. After consideration of risks, benefits and other options for treatment, the patient has consented to  Procedure(s): LEFT HEART CATH AND CORS/GRAFTS ANGIOGRAPHY (N/A) as a surgical intervention.  The patient's history has been reviewed, patient examined, no change in status, stable for surgery.  I have reviewed the patient's chart and labs.  Questions were answered to the patient's satisfaction.     Shelva Majestic

## 2021-12-14 NOTE — Progress Notes (Signed)
Site area: right femoral Site Prior to Removal:  Level 0 Pressure Applied For: 15 minutes Manual:   yes, pulled by Orbie Pyo RN Patient Status During Pull:  stable Post Pull Site:  Level 0 Post Pull Instructions Given:  yes Post Pull Pulses Present: DP  palpable Dressing Applied:  sterile tegaderm dressing  Bedrest begins @ 11:10 Comments: patient tolerated well

## 2021-12-14 NOTE — Progress Notes (Signed)
Patient and daughter was given discharge instructions. Both verbalized understanding. 

## 2021-12-14 NOTE — Discharge Instructions (Signed)

## 2021-12-15 ENCOUNTER — Encounter (HOSPITAL_COMMUNITY): Payer: Self-pay | Admitting: Cardiovascular Disease

## 2021-12-15 MED FILL — Lidocaine HCl Local Preservative Free (PF) Inj 1%: INTRAMUSCULAR | Qty: 30 | Status: AC

## 2021-12-19 ENCOUNTER — Telehealth: Payer: Self-pay | Admitting: Cardiology

## 2021-12-19 DIAGNOSIS — I6529 Occlusion and stenosis of unspecified carotid artery: Secondary | ICD-10-CM

## 2021-12-19 NOTE — Telephone Encounter (Signed)
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  Advised message would be sent to provider

## 2021-12-19 NOTE — Telephone Encounter (Signed)
Pt is requesting call back in regards to the cath procedure done on 06/08. She would like to discuss the results of this.

## 2021-12-20 NOTE — Telephone Encounter (Signed)
Blockage in the vertebral artery is typically just treated with medications of which she is already on. I would get a carotid done so we can better evaluate this area and also check the other arteries in the area as well. Please order carotid US for carotid stenosis.    Zandra Abts MD

## 2021-12-20 NOTE — Telephone Encounter (Signed)
Patient informed and verbalized understanding of plan. 

## 2022-01-03 ENCOUNTER — Ambulatory Visit (INDEPENDENT_AMBULATORY_CARE_PROVIDER_SITE_OTHER): Payer: Self-pay

## 2022-01-03 DIAGNOSIS — I6529 Occlusion and stenosis of unspecified carotid artery: Secondary | ICD-10-CM

## 2022-02-07 ENCOUNTER — Ambulatory Visit: Payer: Self-pay | Admitting: Cardiology

## 2022-03-15 ENCOUNTER — Other Ambulatory Visit: Payer: Self-pay | Admitting: Family Medicine

## 2022-03-15 DIAGNOSIS — G8929 Other chronic pain: Secondary | ICD-10-CM

## 2022-03-15 DIAGNOSIS — G894 Chronic pain syndrome: Secondary | ICD-10-CM

## 2022-05-21 ENCOUNTER — Ambulatory Visit: Payer: Self-pay | Admitting: Family Medicine

## 2022-06-05 ENCOUNTER — Ambulatory Visit: Payer: Self-pay | Admitting: Cardiology

## 2022-09-04 ENCOUNTER — Encounter: Payer: Self-pay | Admitting: Family Medicine
